# Patient Record
Sex: Female | Born: 1935 | Race: White | Hispanic: No | Smoking: Current every day smoker
Health system: Southern US, Community
[De-identification: ages and names within clinical notes are randomized; demographics above are authoritative.]

## PROBLEM LIST (undated history)

## (undated) DIAGNOSIS — R55 Syncope and collapse: Secondary | ICD-10-CM

## (undated) DIAGNOSIS — M1990 Unspecified osteoarthritis, unspecified site: Secondary | ICD-10-CM

## (undated) DIAGNOSIS — F329 Major depressive disorder, single episode, unspecified: Secondary | ICD-10-CM

## (undated) DIAGNOSIS — R5383 Other fatigue: Secondary | ICD-10-CM

## (undated) DIAGNOSIS — E7800 Pure hypercholesterolemia, unspecified: Secondary | ICD-10-CM

## (undated) DIAGNOSIS — G4730 Sleep apnea, unspecified: Secondary | ICD-10-CM

## (undated) DIAGNOSIS — I4819 Other persistent atrial fibrillation: Secondary | ICD-10-CM

## (undated) DIAGNOSIS — T8859XA Other complications of anesthesia, initial encounter: Secondary | ICD-10-CM

## (undated) DIAGNOSIS — J449 Chronic obstructive pulmonary disease, unspecified: Secondary | ICD-10-CM

## (undated) DIAGNOSIS — I4891 Unspecified atrial fibrillation: Secondary | ICD-10-CM

## (undated) DIAGNOSIS — F0390 Unspecified dementia without behavioral disturbance: Secondary | ICD-10-CM

## (undated) DIAGNOSIS — F32A Depression, unspecified: Secondary | ICD-10-CM

## (undated) DIAGNOSIS — T4145XA Adverse effect of unspecified anesthetic, initial encounter: Secondary | ICD-10-CM

## (undated) HISTORY — PX: BACK SURGERY: SHX140

## (undated) HISTORY — PX: NECK SURGERY: SHX720

## (undated) HISTORY — DX: Major depressive disorder, single episode, unspecified: F32.9

## (undated) HISTORY — DX: Other persistent atrial fibrillation: I48.19

## (undated) HISTORY — DX: Syncope and collapse: R55

## (undated) HISTORY — PX: EYE SURGERY: SHX253

## (undated) HISTORY — DX: Other fatigue: R53.83

## (undated) HISTORY — PX: ABDOMINAL HYSTERECTOMY: SHX81

## (undated) HISTORY — PX: KNEE SURGERY: SHX244

## (undated) HISTORY — DX: Unspecified atrial fibrillation: I48.91

## (undated) HISTORY — DX: Depression, unspecified: F32.A

---

## 1998-05-06 ENCOUNTER — Emergency Department (HOSPITAL_COMMUNITY): Admission: EM | Admit: 1998-05-06 | Discharge: 1998-05-06 | Payer: Self-pay | Admitting: Emergency Medicine

## 1999-01-21 ENCOUNTER — Other Ambulatory Visit: Admission: RE | Admit: 1999-01-21 | Discharge: 1999-01-21 | Payer: Self-pay | Admitting: Podiatry

## 2001-05-09 ENCOUNTER — Other Ambulatory Visit: Admission: RE | Admit: 2001-05-09 | Discharge: 2001-05-09 | Payer: Self-pay | Admitting: Family Medicine

## 2002-10-08 ENCOUNTER — Encounter: Payer: Self-pay | Admitting: Family Medicine

## 2002-10-08 ENCOUNTER — Encounter: Admission: RE | Admit: 2002-10-08 | Discharge: 2002-10-08 | Payer: Self-pay | Admitting: Family Medicine

## 2003-01-03 ENCOUNTER — Inpatient Hospital Stay (HOSPITAL_COMMUNITY): Admission: RE | Admit: 2003-01-03 | Discharge: 2003-01-08 | Payer: Self-pay | Admitting: Neurosurgery

## 2003-01-03 ENCOUNTER — Encounter: Payer: Self-pay | Admitting: Neurosurgery

## 2003-07-29 ENCOUNTER — Ambulatory Visit (HOSPITAL_COMMUNITY): Admission: RE | Admit: 2003-07-29 | Discharge: 2003-07-29 | Payer: Self-pay | Admitting: *Deleted

## 2003-07-29 ENCOUNTER — Encounter (INDEPENDENT_AMBULATORY_CARE_PROVIDER_SITE_OTHER): Payer: Self-pay | Admitting: *Deleted

## 2004-06-24 ENCOUNTER — Encounter (INDEPENDENT_AMBULATORY_CARE_PROVIDER_SITE_OTHER): Payer: Self-pay | Admitting: *Deleted

## 2004-06-24 ENCOUNTER — Ambulatory Visit (HOSPITAL_COMMUNITY): Admission: RE | Admit: 2004-06-24 | Discharge: 2004-06-24 | Payer: Self-pay | Admitting: *Deleted

## 2004-10-08 ENCOUNTER — Encounter: Admission: RE | Admit: 2004-10-08 | Discharge: 2004-10-08 | Payer: Self-pay | Admitting: Orthopedic Surgery

## 2004-12-29 ENCOUNTER — Encounter: Admission: RE | Admit: 2004-12-29 | Discharge: 2004-12-29 | Payer: Self-pay | Admitting: Orthopedic Surgery

## 2005-09-27 ENCOUNTER — Other Ambulatory Visit: Admission: RE | Admit: 2005-09-27 | Discharge: 2005-09-27 | Payer: Self-pay | Admitting: Family Medicine

## 2006-01-19 ENCOUNTER — Ambulatory Visit: Payer: Self-pay | Admitting: Pulmonary Disease

## 2006-02-11 ENCOUNTER — Ambulatory Visit: Payer: Self-pay | Admitting: Pulmonary Disease

## 2006-02-24 ENCOUNTER — Ambulatory Visit: Payer: Self-pay | Admitting: Pulmonary Disease

## 2006-03-01 ENCOUNTER — Ambulatory Visit: Payer: Self-pay | Admitting: Pulmonary Disease

## 2006-03-01 ENCOUNTER — Ambulatory Visit: Admission: RE | Admit: 2006-03-01 | Discharge: 2006-03-01 | Payer: Self-pay | Admitting: Pulmonary Disease

## 2006-03-01 ENCOUNTER — Encounter (INDEPENDENT_AMBULATORY_CARE_PROVIDER_SITE_OTHER): Payer: Self-pay | Admitting: *Deleted

## 2006-03-23 ENCOUNTER — Ambulatory Visit: Payer: Self-pay | Admitting: Pulmonary Disease

## 2006-04-25 ENCOUNTER — Ambulatory Visit (HOSPITAL_BASED_OUTPATIENT_CLINIC_OR_DEPARTMENT_OTHER): Admission: RE | Admit: 2006-04-25 | Discharge: 2006-04-25 | Payer: Self-pay | Admitting: Pulmonary Disease

## 2006-04-25 ENCOUNTER — Ambulatory Visit: Payer: Self-pay | Admitting: Pulmonary Disease

## 2006-05-17 ENCOUNTER — Ambulatory Visit: Payer: Self-pay | Admitting: Cardiovascular Disease

## 2006-05-27 ENCOUNTER — Ambulatory Visit: Payer: Self-pay | Admitting: Pulmonary Disease

## 2007-01-12 ENCOUNTER — Ambulatory Visit: Payer: Self-pay | Admitting: Pulmonary Disease

## 2007-02-13 ENCOUNTER — Ambulatory Visit: Payer: Self-pay | Admitting: Pulmonary Disease

## 2007-02-13 ENCOUNTER — Ambulatory Visit (HOSPITAL_BASED_OUTPATIENT_CLINIC_OR_DEPARTMENT_OTHER): Admission: RE | Admit: 2007-02-13 | Discharge: 2007-02-13 | Payer: Self-pay | Admitting: Pulmonary Disease

## 2007-03-24 ENCOUNTER — Ambulatory Visit: Payer: Self-pay | Admitting: Pulmonary Disease

## 2007-09-06 ENCOUNTER — Ambulatory Visit: Payer: Self-pay | Admitting: Pulmonary Disease

## 2007-09-06 DIAGNOSIS — F17200 Nicotine dependence, unspecified, uncomplicated: Secondary | ICD-10-CM

## 2007-09-06 DIAGNOSIS — J449 Chronic obstructive pulmonary disease, unspecified: Secondary | ICD-10-CM

## 2007-09-06 DIAGNOSIS — G4733 Obstructive sleep apnea (adult) (pediatric): Secondary | ICD-10-CM

## 2007-11-03 ENCOUNTER — Telehealth (INDEPENDENT_AMBULATORY_CARE_PROVIDER_SITE_OTHER): Payer: Self-pay | Admitting: *Deleted

## 2007-11-20 ENCOUNTER — Encounter: Payer: Self-pay | Admitting: Pulmonary Disease

## 2009-04-30 ENCOUNTER — Encounter: Admission: RE | Admit: 2009-04-30 | Discharge: 2009-06-10 | Payer: Self-pay | Admitting: Family Medicine

## 2009-06-10 ENCOUNTER — Inpatient Hospital Stay (HOSPITAL_COMMUNITY): Admission: RE | Admit: 2009-06-10 | Discharge: 2009-06-11 | Payer: Self-pay | Admitting: Neurosurgery

## 2010-01-19 ENCOUNTER — Encounter: Admission: RE | Admit: 2010-01-19 | Discharge: 2010-01-19 | Payer: Self-pay | Admitting: Orthopedic Surgery

## 2010-07-29 ENCOUNTER — Encounter: Payer: Self-pay | Admitting: Pulmonary Disease

## 2010-09-16 ENCOUNTER — Encounter: Payer: Self-pay | Admitting: Pulmonary Disease

## 2010-10-06 NOTE — Letter (Signed)
Summary: Denied CMN/[facility]  Denied CMN/[facility]   Imported By: Lester Ellinwood 08/04/2010 12:52:01  _____________________________________________________________________  External Attachment:    Type:   Image     Comment:   External Document

## 2010-10-08 NOTE — Letter (Signed)
Summary: CMN for DME Denied / [facility]  CMN for DME Denied / [facility]   Imported By: Lennie Odor 09/22/2010 09:55:08  _____________________________________________________________________  External Attachment:    Type:   Image     Comment:   External Document

## 2010-12-10 LAB — CBC
HCT: 38.9 % (ref 36.0–46.0)
Hemoglobin: 13.3 g/dL (ref 12.0–15.0)
MCHC: 34.2 g/dL (ref 30.0–36.0)
MCV: 92.7 fL (ref 78.0–100.0)
RBC: 4.2 MIL/uL (ref 3.87–5.11)
RDW: 12.9 % (ref 11.5–15.5)
WBC: 11.2 10*3/uL — ABNORMAL HIGH (ref 4.0–10.5)

## 2010-12-10 LAB — BASIC METABOLIC PANEL
BUN: 13 mg/dL (ref 6–23)
Calcium: 9.3 mg/dL (ref 8.4–10.5)
Chloride: 104 mEq/L (ref 96–112)
Creatinine, Ser: 0.64 mg/dL (ref 0.4–1.2)
GFR calc non Af Amer: >60 mL/min (ref >60)
Glucose, Bld: 71 mg/dL (ref 70–99)
Sodium: 139 mEq/L (ref 135–145)

## 2011-01-19 NOTE — Assessment & Plan Note (Signed)
[facility]                             PULMONARY OFFICE NOTE   AMESHA, Linda Morse                  MRN:          [id number]  DATE:01/12/2007                            DOB:          07/04/1934    I saw Linda Morse today in follow-up for her COPD with continued  tobacco abuse and obstructive sleep apnea.   With regards to her obstructive sleep apnea, she tried positional  therapy but due to symptoms of arthritis, she has had difficulty with  this.  She still complains of sleep disruption with excessive daytime  sleepiness.   With regards to her COPD, she seems to be doing reasonably well on her  inhaler regimen of Spiriva one puff daily.  She says she still continues  to smoke.  She had tried Chantix previously but was not able to tolerate  this.   PHYSICAL EXAMINATION:  VITAL SIGNS:  She is 122 pounds, temperature 98,  blood pressure 110/60, heart rate 66, oxygen saturation 94% on room air.  HEENT:  There was no sinus tenderness, no oral lesion.  CHEST:  No wheezing.  CARDIOVASCULAR:  S1 and S2.  ABDOMEN:  Thin, soft and nontender.  EXTREMITIES:  No edema.   IMPRESSION:  1. Chronic obstructive pulmonary disease.  I would continue her on      Spiriva one puff daily.  2. Tobacco abuse.  I will start her on Wellbutrin 150 mg daily. For      three days and then increase it to 150 mg b.i.d. She denies any      history of seizure disorder.  I have also instructed her that she      will need to quit smoking approximately one week after starting the      Wellbutrin.  3. Obstructive sleep apnea.  She has not had a good response to      positional therapy.  I had reviewed other options for treatment of      her sleep apnea including CPAP therapy, oral appliance and surgical      intervention.  She would prefer to try CPAP therapy.  I will      therefore refer her back to the sleep lab for CPAP titration study.   I will follow up with her in  approximately six weeks.     Coralyn Helling, MD  Electronically Signed    VS/MedQ  DD: 01/12/2007  DT: 01/12/2007  Job #: [id number]   cc:   Chales Salmon. Abigail Miyamoto, M.D.

## 2011-01-19 NOTE — Procedures (Signed)
Linda Morse, Linda Morse           ACCOUNT NO.:  [id number]   MEDICAL RECORD NO.:  [id number]          PATIENT TYPE:  OUT   LOCATION:  SLEEP CENTER                 FACILITY:  [facility]   PHYSICIAN:  Coralyn Helling, MD        DATE OF BIRTH:  May 06, 1934   DATE OF STUDY:  02/13/2007                            NOCTURNAL POLYSOMNOGRAM   REFERRING PHYSICIAN:  Coralyn Helling, MD   INDICATIONS FOR PROCEDURE:  This is an individual who had undergone an  overnight polysomnogram on April 25, 2006 and was found not have an  apnea/hypopnea index of 15.2 with an oxygen saturation of 80%. She was  initially tried on positional therapy but did not have a good response  to this. She is referred to the sleep lab for a CPAP titration study.   RESULTS:  Epworth score 7.   MEDICATIONS:  None.   SLEEP ARCHITECTURE:  Total recording time was 383 minutes. Total sleep  time was 257 minutes. Sleep efficiency was 67%, which is reduced. Sleep  latency was 47 minutes, which is prolonged. REM latency was 42.5  minutes, which is reduced. The patient observed in all stages of sleep.  The patient slept in both the supine and non-supine position.   RESPIRATORY DATA:  The average respiratory rate was 16. The patient was  initially titrated from a CPAP pressure setting of 5 to 8 cm of water.  She was then switched to Bi-PAP at 8/4. When she was initially tried on  CPAP therapy, she did have improvement in her respiratory pattern,  however, she was noted to have frequent PCV's, which completely resolved  after being switched to BiPAP therapy. At a BiPAP pressure setting of  8/4, her apnea/hypopnea index was 2.6. At this pressure setting, she was  observed in both REM sleep and supine sleep and snoring was eliminated.   OXYGEN DATA:  The baseline oxygenation was 98%. The oxygen saturation  nadir was 90%. At a BiPAP pressure setting of 8/4, the mean oxygenation  during non-REM sleep was 95%. The mean oxygenation during REM  sleep was  95%. The minimal oxygenation during non-REM sleep was 93% and the  minimal oxygenation during REM sleep was 92%.   CARDIAC DATA:  The rhythm strip showed normal sinus rhythm with sinus  bradycardia and frequent PVC's, which resolved after she was started on  Bi-PAP.   MOVEMENT/PARASOMNIA:  The periodic limb movement index was 2.6. The  patient had 1 bathroom trip. No other abnormal behaviors were noted.   IMPRESSION:  This was a CPAP titration study. The patient was titrated  to a BiPAP pressure study of 8/4 with a reduction in her apnea/hypopnea  index of 2.6. At this pressure setting she was observed in both REM  sleep and supine sleep and snoring was eliminated. Of note is that at  lower pressure settings, while on CPAP, she had persistent PVC's, which  appeared to have resolved after she was switched to BiPAP therapy.  Therefore, I have recommended that she be initiated on BiPAP at 8/4.      Coralyn Helling, MD  Diplomat, American Board of Sleep Medicine  Electronically Signed  VS/MEDQ  D:  02/18/2007 11:49:14  T:  02/18/2007 22:15:56  Job:  [id number]

## 2011-01-19 NOTE — Assessment & Plan Note (Signed)
[facility]                             PULMONARY OFFICE NOTE   LARYA, CHARPENTIER                  MRN:          [id number]  DATE:03/24/2007                            DOB:          1933/09/13    I saw Ms. Blackburn in follow up today for her chronic obstructive  pulmonary disease, tobacco abuse, and obstructive sleep apnea. Since her  last visit, she has undergone a BiPAP titration study and she has been  titrated to a BiPAP pressure setting of 8/4 with a reduction in her  apnea hypopnea to 2.6. Since that time she has been started on BiPAP  with a full face mask. She says that she is sleeping much better and  feels like she has more energy during the day. Her main difficulty is  that she has soreness over the bridge of her nose. She has not tried any  other mask types. She has a heated humidifier, but was not aware that  she could adjust the temperature on this. She does complain of having  dryness in her mouth. With regards to her chronic obstructive pulmonary  disease, she says that her symptoms are reasonably stable on her inhaler  regimen of Spiriva. With regards to her tobacco abuse, she continues to  smoke intermittently.   CURRENT MEDICATIONS:  1. Miacalcin once daily.  2. Premarin daily.  3. Zetia 10 mg daily.  4. Lipitor 40 mg daily.  5. Spiriva 1 puff daily.  6. Vitamin E daily.  7. Aspirin 81 mg daily.   PHYSICAL EXAMINATION:  VITAL SIGNS:  Temperature 98.1, weight 120, blood  pressure 118/60, heart rate 66, oxygen saturation 94% on room air.  HEENT:  No sinus tenderness, no nasal discharge, no oral lesions. She  has mild erythema on the bridge of her nose. There is no  lymphadenopathy.  HEART:  S1, S2.  CHEST:  Prolonged expiratory phase, but no wheezing or rales.  ABDOMEN:  Soft and nontender.  EXTREMITIES:  No edema.   IMPRESSION:  1. Chronic obstructive pulmonary disease. She is to continue on her      use of  Spiriva.  2. Tobacco abuse. I have, again, emphasized to her the importance of      smoking cessation.  3. Obstructive sleep apnea. She is doing fairly well on her current      settings of BiPAP at 8/4. I      have discussed with her face techniques to improve the fit of her      mask. I will arrange for her to be fitted with a nasal mask with a      chin strap to see if this will improve her tolerance.   I plan on following up with her in approximately 4-6 months.     Coralyn Helling, MD  Electronically Signed    VS/MedQ  DD: 03/24/2007  DT: 03/25/2007  Job #: [id number]

## 2011-01-22 NOTE — Discharge Summary (Signed)
NAME:  Linda Morse, Linda Morse                     ACCOUNT NO.:  [id number]   MEDICAL RECORD NO.:  [id number]                   PATIENT TYPE:  INP   LOCATION:  3002                                 FACILITY:  [facility]   PHYSICIAN:  Hewitt Shorts, M.D.            DATE OF BIRTH:  12-Sep-1933   DATE OF ADMISSION:  01/03/2003  DATE OF DISCHARGE:  01/08/2003                                 DISCHARGE SUMMARY   HISTORY OF PRESENT ILLNESS:  Admission history and physical examination  shows patient is a 77 year old woman who presented with right lumbar  radiculopathy.  It had worsened over the past couple of years with low back  pain radiating into the right buttock, posterior thigh and calf.  The  patient had been evaluated with MRI and x-rays.  There were degenerative  dynamic spondylolisthesis of L4-L5 secondary to facet arthropathy that was  moderately advanced.  There was multifactorial lumbar stenosis L4-L5 with a  central to right L4-L5 lumbar disc versus a small left L4-L5 synovial cyst.  The patient was admitted for decompression and arthrodesis.  Her general  examination was unremarkable. Her neurological examination showed 5/5  strength.   HOSPITAL COURSE:  The patient was admitted and underwent a L4-L5 posterior  lumbar interbody fusion with Globus PEEK cages with VTOS and bone marrow  aspirate and bilateral L4-L5 posterior lateral arthrodesis with 90D  posterior instrumentation and VTOS with bone marrow aspirate with  microdissection.  Following surgery the patient did well. She initially had  some mild fever.  She had difficulty with tablets. We ordered Tylox capsules  but they apparently are not available within the hospital and therefore she  was placed on an Oxycodone elixir and has done well with that.  Her  mobilization was slow and therefore physical therapy and occupational  therapy were consulted and the patient has made gradual progress in  increasing her mobility and  activity.  Her wound has healed well.  She is  afebrile.  She did have some evidence of mild dysuria but a urinalysis was  negative and a urine culture is pending at this time.   Social work was consulted to make arrangements for home health physical  therapy, occupational therapy and home health aide.  The patient has  explained that she has the necessary durable medical equipment at home.   DISPOSITION:  She is being discharged to home with instructions regarding  wound care and activity.  She is to return to my office in three weeks for  follow up and is to have AP and lateral lumbar spine x-rays done at that  time.  A discharge prescription was given for Tylox one capsule p.o. q.i.d.  PRN pain #40 capsules prescribed with no refills.  She is also advised to  use Aleve two tablets b.i.d. and for constipation she is advised to use  Dulcolax tablets, Milk of Magnesia or Fleet's enema.   DISCHARGE DIAGNOSES:  1. Lumbar disc herniation.  2. Degenerative dynamic lumbar spondylolisthesis.  3. Lumbar stenosis.  4. Lumbar radiculopathy.                                               Hewitt Shorts, M.D.    RWN/MEDQ  D:  01/08/2003  T:  01/08/2003  Job:  [id number]

## 2011-01-22 NOTE — Procedures (Signed)
NAME:  Linda Morse, Linda Morse           ACCOUNT NO.:  [id number]   MEDICAL RECORD NO.:  [id number]          PATIENT TYPE:  OUT   LOCATION:  SLEEP CENTER                 FACILITY:  [facility]   PHYSICIAN:  Coralyn Helling, MD        DATE OF BIRTH:  02/19/1934   DATE OF STUDY:  04/25/2006                              NOCTURNAL POLYSOMNOGRAM   REFERRING PHYSICIAN:  Coralyn Helling MD   INDICATIONS FOR THIS STUDY:  This is an individual who has symptoms of  daytime fatigue and snoring as well as hypertension; and she is referred to  the sleep lab for evaluation of obstructive sleep apnea.  Her medications  are Lipitor, Zetia, Premarin, Detrol LA and Spiriva.   EPWORTH SCORE:  6   SLEEP ARCHITECTURE:  Total recording time was 419.5 minutes, total sleep  time was 291.5 minutes.  Sleep efficiency was 69% which is reduced; of note  is that the patient spent a prolonged period of time initially reading a  book before going to sleep.  She was observed in all stages of sleep,  although there was a relative reduction in percentage of REM sleep to 13% of  this study.  Sleep latency was 70.5 minutes which was prolonged.  REM  latency was 63 minutes after sleep onset which is slightly reduced.  The  patient was observed in both the supine and nonsupine position.   RESPIRATORY DATA:  The average respiratory rate was 18.  The apnea-hypopnea  index was 15.2.  the events were exclusively obstructive in nature.  Moderate snoring was noted by the technician.  Of note, is that the patient  had a significant supine effect with the supine apnea-hypopnea index of 27.4  versus a nonsupine apnea-hypopnea index of 3.3.   OXYGEN DATA:  The baseline oxygenation was 94%.  The oxygen saturation nadir  was 88%.  The patient spent total a total of 411.4 minutes of test time with  an oxygen saturation between 91 and 100% and 1.9 minutes with an oxygen  saturation between 81 and 90%.   CARDIAC DATA:  The average heart rate was 60.   EKG showed normal sinus  rhythm with sinus bradycardia and occasional PACs.   MOVEMENT/PARASOMNIA:  Periodic limb movement index was 11.9   IMPRESSION/RECOMMENDATION:  This study shows evidence for a moderate  obstructive sleep apnea as demonstrated by an apnea-hypopnea index of 15.2  and an oxygen saturation nadir of 88%.  Of note is that she had a  significant positional component to her sleep apnea.  At this time, the  recommendation would be to have her try  positional therapy.  However, if she is not able to tolerate this and she is  still symptomatic, then further consideration should be given to either CPAP  therapy, oral appliance, or surgical interventions.      Coralyn Helling, MD  Diplomat, American Board of Sleep Medicine  Electronically Signed     VS/MEDQ  D:  05/03/2006 15:27:47  T:  05/04/2006 11:45:03  Job:  [id number]

## 2011-01-22 NOTE — H&P (Signed)
NAME:  Linda Morse, Linda Morse                     ACCOUNT NO.:  [id number]   MEDICAL RECORD NO.:  [id number]                   PATIENT TYPE:  INP   LOCATION:  3002                                 FACILITY:  [facility]   PHYSICIAN:  Hewitt Shorts, M.D.            DATE OF BIRTH:  [birth year]   DATE OF ADMISSION:  01/03/2003  DATE OF DISCHARGE:                                HISTORY & PHYSICAL   HISTORY OF PRESENT ILLNESS:  The patient is a 77 year old woman who was  evaluated for right lumbar radiculopathy. She was having some difficulty  with low back pain for two or more years. She has had a variable amount of  right lumbar radicular pain since it has worsened last fall. She describes  the pain as in her low back, extending down into the right buttock,  posterior thigh, and calf. She does not describe any numbness, paresthesia,  or weakness. She has been using ibuprofen which does ease her discomfort;  however, she still finds that standing for 10 minutes or more will cause  pain running down into the right lower extremity.   PAST MEDICAL HISTORY:  1. Hypercholesterolemia.  2. Osteoporosis.   She does not describe any history of hypertension, myocardial infarction,  cancer, stroke, diabetes, peptic ulcer disease, or lung disease.   ALLERGIES:  She does not describe any allergies to medications.   MEDICATIONS:  1. Premarin 0.9 mg q.d.  2. Miacalcin q.d.  3. She has been on Lipitor but has not been taking it for the past week.   PAST SURGICAL HISTORY:  1. Hysterectomy in 1990.  2. Foot surgery in 2000.   FAMILY HISTORY:  Her parents have passed on.   SOCIAL HISTORY:  The patient is a widow. She is retired from the [employer]. She  smokes a pack a day. She has smoked for 44 years. She does not drink  alcoholic beverages, and she denies history of substance abuse.   REVIEW OF SYSTEMS:  Notable as described in the history of present illness  and past medical history but otherwise  unremarkable.   PHYSICAL EXAMINATION:  GENERAL:  The patient is a well-developed, well-  nourished, white female in no acute distress. Her height is 5 foot, weight  120 pounds. She is afebrile. Temperature is 97.6, pulse 71, blood pressure  148/78, respiratory rate 16.  LUNGS:  Clear to auscultation. She has symmetric respiratory excursion.  HEART:  Regular rate and rhythm. Normal S1 and S2. There is no murmur.  ABDOMEN:  Soft, nondistended, bowel sounds are present.  EXTREMITIES:  She has no clubbing, cyanosis, or edema.  MUSCULOSKELETAL:  Shows no tenderness to palpation of her lumbar spine  processes or parallel musculature. She is able to flex to 90 degrees, able  to extend well. Straight leg raise is negative bilaterally.  NEUROLOGICAL:  Shows 5/5 strength in the dorsiflexor, plantar flexor, flexor  hallucis longus bilaterally. Sensation is intact to  pinprick in the distal  lower extremities. Reflexes are 2 to 3 in the quadriceps, 1 to 2 in the  gastrocnemius, symmetrical bilaterally. Toes are downgoing bilaterally. She  has a normal gait and stance.   DIAGNOSTIC STUDIES:  MRI of the lumbar spine shows degenerative dynamic  spondylolisthesis, L4 and 5, secondary to facet arthropathy. There is  multifactorial lumbar stenosis at L4-5 with a central to right L4-5 disk  herniation, a small left L4-5 synovial cyst.   IMPRESSION:  The patient with low back and right lumbar radicular pain  secondary to a central to right L4-5 disk herniation superimposed upon  degenerative dynamic spondylolisthesis, lumbar stenosis, lumbar spondylosis,  and degenerative disk disease.   PLAN:  The patient will be admitted for a L4 Gill procedure, L4-5  microdiskectomy, and posterior lumbar interbody fusion with posterior  arthrodesis with posterior instrumentation and bone graft. I discussed the  nature of the procedure at length with the patient using a model in my  office along with her x-rays and  MRI. I discussed the nature of surgery,  recuperation for the patient postoperatively, and need for postoperative  mobilization in a lumbar brace. We also discussed surgical risks of  infection, bleeding, possible need for transfusion, the risk of never  reducing the pain, ________ possible need for further surgery, anesthetic  risks for myocardial infarction, stroke, pneumonia, and death. We also  discussed failure of the arthrodesis in light of both her smoking history  and her history of osteoporosis. We also explained that we would use a Cell  saver during her surgery. Understanding all this, she does wish to proceed  with surgery and is admitted for such.                                               Hewitt Shorts, M.D.    RWN/MEDQ  D:  01/04/2003  T:  01/04/2003  Job:  [id number]

## 2011-01-22 NOTE — Op Note (Signed)
Linda Morse, Linda Morse           ACCOUNT NO.:  [id number]   MEDICAL RECORD NO.:  [id number]          PATIENT TYPE:  AMB   LOCATION:  CARD                         FACILITY:  [facility]   PHYSICIAN:  Coralyn Helling, M.D.      DATE OF BIRTH:  05-25-1934   DATE OF PROCEDURE:  03/01/2006  DATE OF DISCHARGE:                                 OPERATIVE REPORT   PREOPERATIVE DIAGNOSIS:  Rule out sarcoidosis.   POSTOPERATIVE DIAGNOSIS:  Rule out sarcoidosis.   PROCEDURE:  Bronchoscopy with bronchioalveolar lavage and transbronchial  biopsy.   [name] is a 77 year old female who is referred for evaluation of  dyspnea.  She is noted to have multinodular lesions on CT scan of the chest  from both January and April, 2007.  She had pulmonary function tests which  showed mild air flow obstruction and air trapping.  There was a concern that  this may represent pulmonary sarcoidosis.  The procedure was explained to  the patient in detail.  Risks involved were explained, including possible  risk for bleeding, infection, pneumothorax, and nondiagnosis.  The patient  signed the consent form.  She received lidocaine nebulizer for topical  anesthesia of her nares and posterior pharynx.  She was given a total of 5  mg of Versed and 100 mcg of fentanyl IV for sedation.  She had narrow nasal  passages.  As a result, the bronchoscope was entered orally.  The vocal  cords were visualized and appeared normal.  Lidocaine 2% was instilled as  needed for topical anesthesia.  The bronchoscope was then entered into the  trachea.  The carina was visualized and appeared normal.  There was no  obvious endobronchial lesions.  The left main bronchus was then entered.  The left upper lingular and lower lobe orifices were all visualized and  appeared normal.  The mucosa appeared normal, and there were no  endobronchial lesions.  The bronchoscope was then entered into the right  main bronchus.  The right upper, middle,  and lower lobe orifices were all  visualized.  There were no obvious endobronchial lesions.  The mucosa  appeared normal.  Then 60 cc of saline was instilled to the right upper lobe  for BAL with approximately 15 ml of fluid returned, which was clear and  foamy.  This was saved, and then the bronchoscope was entered into the right  lower lobe.  Again, 60 cc of saline was instilled for BAL.  There was,  again, approximately 15 cc of fluid returned.  This was clear and foamy.  Then using fluoroscopic guidance, transbronchial biopsies were obtained from  the right lower lobe and then the right upper lobe.  There was a minimal  amount of bleeding, but on further inspection and after instillation of  saline, the bleeding resolved spontaneously.  Of note was that the patient  had a temporary drop in her oxygen saturation, which improved immediately  with jaw thrust maneuver.  Again, there was a clinical suspicion beforehand  for the possibility of obstructive sleep apnea.  Otherwise, the patient  tolerated the procedure well.  There was  no other immediate complication.  She is scheduled to have a post procedure chest x-ray and the samples will  be sent for cytology, microbiology, and surgical pathology.  I then will  call her with the results as soon as they are available.      Coralyn Helling, M.D.  Electronically Signed     VS/MEDQ  D:  03/01/2006  T:  03/01/2006  Job:  [id number]

## 2011-01-22 NOTE — Op Note (Signed)
NAME:  Linda Morse, Linda Morse                     ACCOUNT NO.:  [id number]   MEDICAL RECORD NO.:  [id number]                   PATIENT TYPE:  INP   LOCATION:  2899                                 FACILITY:  [facility]   PHYSICIAN:  Hewitt Shorts, M.D.            DATE OF BIRTH:  04-14-1934   DATE OF PROCEDURE:  01/03/2003  DATE OF DISCHARGE:                                 OPERATIVE REPORT   PREOPERATIVE DIAGNOSIS:  Lumbar stenosis, dynamic degenerative  spondylolisthesis, lumbar disk herniation, lumbar radiculopathy, lumbar  spondylosis and lumbar degenerative disk disease.   POSTOPERATIVE DIAGNOSIS:  Lumbar stenosis, dynamic degenerative  spondylolisthesis, lumbar disk herniation, lumbar radiculopathy, lumbar  spondylosis and lumbar degenerative disk disease.   OPERATION PERFORMED:  L4 Gill procedure.  Bilateral L4-5 posterior lumbar  interbody fusion with Globus PEEK cages with Vitoss and bone marrow aspirate  and bilateral L4-5 posterolateral arthrodesis with 90D posterior  instrumentation and Vitoss with bone marrow aspirate.   SURGEON:  Hewitt Shorts, M.D.   ASSISTANT:  Stefani Dama, M.D.   ANESTHESIA:  General endotracheal.   INDICATIONS FOR PROCEDURE:  The patient is a  77 year old woman who  presented with lumbar radiculopathy and was found to have a ruptured disk  superimposed upon spinal stenosis contributed to by dynamic degenerative  spondylolisthesis with underlying spondylosis and degenerative disk disease.  Decision was made to proceed with decompression and arthrodesis.   DESCRIPTION OF PROCEDURE:  The patient was brought to the operating room and  placed under general endotracheal anesthesia. The patient was turned to  prone position and lumbar region was prepped with Betadine soap and solution  and draped in sterile fashion. The midline was infiltrated with local  anesthetic with epinephrine.  An x-ray was taken, the L4-5 level identified  and then  midline incision made over that level and carried down to the  subcutaneous tissue with bipolar cautery and electrocautery used to maintain  hemostasis.  Dissection was carried down the lumbar fascia which was incised  bilaterally and the paraspinal muscles dissected from the spinous processes  and lamina in subperiosteal fashion.  The L4-5 interlaminar space was  identified and x-ray was taken to confirm localization and then dissection  was carried out laterally over the facet joints to expose the L4 and L5  transverse process bilaterally.  These were packed off and then we proceeded  with L4 Gill procedure, performing a laminectomy and facetectomy so as to  decompress the stenosis and foramina.  We identified the thecal sac and L4  and L5 nerve roots bilaterally.  There is a moderate amount of scarring  between the ligamentum flavum and the dura and two small dural rents  occurred on the right side.  These were identified and repaired with 6-0  Prolene suture with good watertight closure.  Immediately prior to  completing and closing this procedure, we did inject Tisseal over these  repairs.  We explored the  epidural space.  There was a large broad based  spondylitic disk herniation at L4-5 with a central disk herniation fragment.  This fragment was removed and then we proceeded with a thorough diskectomy  bilaterally using a variety of curets and pituitary rongeurs, we removed  posterior osteophytic overgrowth bilaterally and the disk space was cleared  of disk material.  We were thereby able to decompress the thecal sac and  nerve roots bilaterally.  We then proceeded with placement of pedicle screws  bilaterally at L4 and L5 using C-arm fluoroscopy for guidance.  The  transverse processes were identified.  The pedicles were identified within  the lateral aspect of the canal and we identified pedicle entry sites  bilaterally at each level.  Again, using the C-arm fluoroscopy, the  pedicles  were probed.  We examined them with a ball probe.  No cut outs were noted.  We then tapped the pedicles with a 5.25 tap with C-arm fluoroscopy.  Again  the pedicles were examined with a ball probe.  No cut outs were noted.  We  did aspirate bone marrow aspirate approximately a total of 25mL which was  injected over a 10mL Vitoss sponge as well as 15mL of loose Vitoss.  We  placed 6.75 x 40 mm polyaxial head screws at L4 and 6.75 x 35 mm fixed head  screws at L5.  An AP C-arm image was obtained which showed good trapezoid  trajectory for the screws.  We then examined the disk space and prepared it  for the interbody arthrodesis.  Using sizers we determined that we would use  an 11 mm narrow Globus PEEK cage.  The cages were packed with Vitoss soaked  in bone marrow aspirate and the cages were placed bilaterally.  We then  packed loose Vitoss soaked in bone marrow aspirate between the cages in the  intervertebral disk space. This was tamped down and densely packed; however,  care was taken to ensure that none of the graft was against any of the  neural elements.  We then packed then Vitoss sponge that was cut in half in  the lateral gutter.  We decorticated the transverse process of L4 and L5  bilaterally and the Vitoss sponge was packed over these  and to the  intertransverse space.  We then selected 40 mm rods that were positioned in  the screw heads and locking caps were placed and fully tightened at all four  screws.  The wound was irrigated throughout the procedure extensively with  bacitracin solution.  The wound was checked for hemostasis and once this was  established and confirmed, we proceeded with closure.  The deep fascia  closed with interrupted undyed 1 Vicryl suture, the subcutaneous and  subcuticular layer closed with interrupted inverted 2-0 undyed Vicryl  sutures and the skin edges approximated with Dermabond.  The patient tolerated the procedure well.  The  estimated blood loss was .  The  patient was given back of Cell Saver blood.  Sponge, needle and  instrument counts were correct.  Following surgery, the patient was turned  back to supine position, reversed from anesthetic, extubated, transferred to  recovery room for further care where she was noted to be following commands  and moving all four extremities.  Hewitt Shorts, M.D.    RWN/MEDQ  D:  01/03/2003  T:  01/03/2003  Job:  [id number]

## 2011-01-22 NOTE — Assessment & Plan Note (Signed)
[facility]                               PULMONARY OFFICE NOTE   Linda Morse, Linda Morse                  MRN:          [id number]  DATE:03/23/2006                            DOB:          1934-07-20    I saw Linda Morse today in followup for her COPD, possible sleep apnea  and abnormal CT.  In summary again, she has undergone a bronchoscopy on June  26 and the results of this were essentially negative.  She says that she  initially had symptomatic benefit from the use of Chantix with regard to  smoking cessation although there was one day in which she took the medicine  and then subsequently became nauseous and had stopped taking the medicine  after that.  Although she is not sure if the nausea was in fact related to  the use of Chantix, she is currently smoking a half pack of cigarettes per  day.  She still complains of symptoms of dyspnea on exertion although she  says the use of Spiriva helps.  She is having similar problems that she had  before with regard to her possible sleep disorder.  She has not had any  significant change in her medications  since her last visit.   PHYSICAL EXAMINATION:  VITAL SIGNS: She is 124 pounds, temperature is 98.4,  blood pressure is 110/78, heart rate is 89, oxygen saturation 94% on room  air.  HEENT:  Pupils are reactive, extraocular movements intact.  There is no  sinus tenderness, no nasal discharge. She has a __________ for airway, low-  lying soft palate with scalloped borders of her tongue. She had no  lymphadenopathy, no thyromegaly.  HEART:  S1 and S2, regular rhythm.  CHEST: Decreased breath sounds bilaterally but no wheezing.  ABDOMEN:  Soft and positive bowel sounds.  EXTREMITIES:  No clubbing, cyanosis or edema.   IMPRESSION:  1.  Chronic obstructive pulmonary disease with continued tobacco abuse. I      will re-initiate her on Chantix.  I will continue her on Spiriva and      start her on  Advair 250/50 one puff b.i.d.  2.  Possible obstructive sleep apnea.  I will make arrangements for her to      undergo an overnight polysomnogram to further evaluate this.  3.  Abnormal CT.  Again, she has had normal results from her bronchoscopy.      I would plan on following up a CT of the chest with contrast in      approximately two months to determine if there have been any changes in      the appearance of her CT and then I would plan on following up with her      after she has had her CT but I would call her before that with the      results of her overnight polysomnogram once the results become      available.  Coralyn Helling, MD   VS/MedQ  DD:  03/23/2006  DT:  03/23/2006  Job #:  [id number]

## 2011-01-22 NOTE — Assessment & Plan Note (Signed)
Linda Morse                               PULMONARY OFFICE NOTE   Linda Morse, Linda Morse                  MRN:          [id number]  DATE:05/27/2006                            DOB:          December 31, 1933    I saw Linda Morse today in followup for her COPD, hypersomnia, and  abnormal CT scan of the chest.  With regards to her abnormalities seen on  previous CT, she had undergone a repeat CT scan of the chest on May 17, 2006, and this showed improvement in the nodular appearance compared to  her previous CTs from January and April 2007.  There was no significant  adenopathy either.  With regards to her possible obstructive sleep apnea,  she had undergone an overnight polysomnogram April 25, 2006, and this  showed moderate obstructive sleep apnea with an apnea/hypopnea index of  15.2, oxygen saturation nadir of 88%.  Of note is that she had a significant  positional effect with a supine apnea/hypopnea index of 27.4 versus a non-  supine apnea/hypopnea index of 3.3.   With regards to her COPD, she says that using Spiriva seems to help, but she  did not notice much of a difference from using the Advair.  She says she is  still smoking about 3-4 cigarettes per day but is actively working on trying  to quit.   PHYSICAL EXAMINATION:  VITAL SIGNS:  She is 124 pounds.  Temperature 97.7,  blood pressure 124/80, heart rate 66, oxygen saturation 97% on room air.  HEENT:  No sinus tenderness, no oral lesions.  NECK:  No lymphadenopathy.  HEART: S1, S2.  CHEST:  No wheezing or rales.  ABDOMEN: Soft, nontender.  EXTREMITIES:  No edema.   IMPRESSION:  1. Chronic obstructive pulmonary disease with continued tobacco abuse.  I      will continue her on Spiriva.  Since she did not notice much of a      benefit from the Advair, I will discontinue this and monitor her      symptom response.  I have again strongly encouraged her to continue her  smoking cessation program.  2. Mild to moderate obstructive sleep apnea.  Again, she had a significant      positional component to this.  At this time, I have instructed her on      positional therapy for her sleep apnea.  I have advised her to monitor      her sleep pattern as well as her feelings of tiredness during the day.      If these become progressively worse, that she may warrant further      intervention for her sleep apnea.  3. Abnormal CT scan of the chest.  Again, most recent chest CT shows      significant improvement compared to CT from January and April 2007.  I      do not feel she will need to have any further imaging studies for this      at the present time as these most likely represent benign inflammatory      processes.  Therefore, unless she is having worsening of her symptom      status, clinical monitoring would be appropriate for this.   I have made arrangements for her to have followup with me in approximately 4  months.                                   Linda Helling, MD   VS/MedQ  DD:  05/27/2006  DT:  05/30/2006  Job #:  [id number]   cc:   Chales Salmon. Abigail Miyamoto, M.D.

## 2011-05-25 ENCOUNTER — Inpatient Hospital Stay (HOSPITAL_COMMUNITY)
Admission: EM | Admit: 2011-05-25 | Discharge: 2011-05-27 | DRG: 563 | Disposition: A | Discharge status: Home Health | Payer: Medicare Other | Attending: Internal Medicine | Admitting: Internal Medicine

## 2011-05-25 ENCOUNTER — Emergency Department (HOSPITAL_COMMUNITY): Payer: Medicare Other

## 2011-05-25 DIAGNOSIS — J449 Chronic obstructive pulmonary disease, unspecified: Secondary | ICD-10-CM | POA: Diagnosis present

## 2011-05-25 DIAGNOSIS — D72829 Elevated white blood cell count, unspecified: Secondary | ICD-10-CM | POA: Diagnosis present

## 2011-05-25 DIAGNOSIS — F17200 Nicotine dependence, unspecified, uncomplicated: Secondary | ICD-10-CM | POA: Diagnosis present

## 2011-05-25 DIAGNOSIS — W1809XA Striking against other object with subsequent fall, initial encounter: Secondary | ICD-10-CM | POA: Diagnosis present

## 2011-05-25 DIAGNOSIS — Y92009 Unspecified place in unspecified non-institutional (private) residence as the place of occurrence of the external cause: Secondary | ICD-10-CM

## 2011-05-25 DIAGNOSIS — S82009A Unspecified fracture of unspecified patella, initial encounter for closed fracture: Principal | ICD-10-CM | POA: Diagnosis present

## 2011-05-25 DIAGNOSIS — J4489 Other specified chronic obstructive pulmonary disease: Secondary | ICD-10-CM | POA: Diagnosis present

## 2011-05-25 DIAGNOSIS — F3289 Other specified depressive episodes: Secondary | ICD-10-CM | POA: Diagnosis present

## 2011-05-25 DIAGNOSIS — F329 Major depressive disorder, single episode, unspecified: Secondary | ICD-10-CM | POA: Diagnosis present

## 2011-05-25 DIAGNOSIS — R82998 Other abnormal findings in urine: Secondary | ICD-10-CM | POA: Diagnosis present

## 2011-05-25 DIAGNOSIS — G4733 Obstructive sleep apnea (adult) (pediatric): Secondary | ICD-10-CM | POA: Diagnosis present

## 2011-05-25 DIAGNOSIS — E785 Hyperlipidemia, unspecified: Secondary | ICD-10-CM | POA: Diagnosis present

## 2011-05-26 ENCOUNTER — Inpatient Hospital Stay (HOSPITAL_COMMUNITY): Payer: Medicare Other

## 2011-05-26 LAB — COMPREHENSIVE METABOLIC PANEL
ALT: 20 U/L (ref 0–35)
AST: 22 U/L (ref 0–37)
Albumin: 3.7 g/dL (ref 3.5–5.2)
Albumin: 3.6 g/dL (ref 3.5–5.2)
Alkaline Phosphatase: 110 U/L (ref 39–117)
BUN: 18 mg/dL (ref 6–23)
CO2: 27 mEq/L (ref 19–32)
CO2: 26 mEq/L (ref 19–32)
Calcium: 9.1 mg/dL (ref 8.4–10.5)
Creatinine, Ser: 0.64 mg/dL (ref 0.50–1.10)
GFR calc Af Amer: >60 mL/min (ref >60)
GFR calc non Af Amer: >60 mL/min (ref >60)
Potassium: 3.9 mEq/L (ref 3.5–5.1)
Potassium: 4.1 mEq/L (ref 3.5–5.1)
Sodium: 136 mEq/L (ref 135–145)
Total Bilirubin: 0.2 mg/dL — ABNORMAL LOW (ref 0.3–1.2)
Total Protein: 6.3 g/dL (ref 6.0–8.3)

## 2011-05-26 LAB — CBC
HCT: 36.5 % (ref 36.0–46.0)
Hemoglobin: 12.6 g/dL (ref 12.0–15.0)
MCH: 30.4 pg (ref 26.0–34.0)
MCH: 29.9 pg (ref 26.0–34.0)
MCHC: 34.9 g/dL (ref 30.0–36.0)
MCHC: 34.5 g/dL (ref 30.0–36.0)
Platelets: 350 10*3/uL (ref 150–400)
RBC: 4.21 MIL/uL (ref 3.87–5.11)
RDW: 12.4 % (ref 11.5–15.5)
RDW: 12.4 % (ref 11.5–15.5)
WBC: 14.5 10*3/uL — ABNORMAL HIGH (ref 4.0–10.5)

## 2011-05-26 LAB — DIFFERENTIAL
Basophils Absolute: 0 10*3/uL (ref 0.0–0.1)
Eosinophils Relative: 0 % (ref 0–5)
Lymphs Abs: 2 10*3/uL (ref 0.7–4.0)
Monocytes Absolute: 0.8 10*3/uL (ref 0.1–1.0)
Neutro Abs: 11.7 10*3/uL — ABNORMAL HIGH (ref 1.7–7.7)

## 2011-05-26 LAB — URINALYSIS, ROUTINE W REFLEX MICROSCOPIC
Bilirubin Urine: NEGATIVE
Glucose, UA: 100 mg/dL — AB
Nitrite: NEGATIVE
Protein, ur: NEGATIVE mg/dL
Specific Gravity, Urine: 1.014 (ref 1.005–1.030)
pH: 5.5 (ref 5.0–8.0)

## 2011-05-26 LAB — PROTIME-INR: INR: 1 (ref 0.00–1.49)

## 2011-05-26 LAB — POCT I-STAT TROPONIN I: Troponin i, poc: 0 ng/mL (ref 0.00–0.08)

## 2011-05-26 LAB — CARDIAC PANEL(CRET KIN+CKTOT+MB+TROPI): Troponin I: <0.3 ng/mL (ref <0.30)

## 2011-05-27 LAB — CBC
HCT: 37 % (ref 36.0–46.0)
Hemoglobin: 12.4 g/dL (ref 12.0–15.0)
MCH: 29.7 pg (ref 26.0–34.0)
MCV: 88.7 fL (ref 78.0–100.0)
RBC: 4.17 MIL/uL (ref 3.87–5.11)
WBC: 9.9 10*3/uL (ref 4.0–10.5)

## 2011-05-27 LAB — URINE CULTURE
Colony Count: 50000
Culture  Setup Time: 201209192249

## 2011-05-28 LAB — URINE CULTURE: Special Requests: NEGATIVE

## 2011-06-01 NOTE — Consult Note (Signed)
Linda Morse, Linda Morse           ACCOUNT NO.:  [id number]  MEDICAL RECORD NO.:  [id number]  LOCATION:  5121                         FACILITY:  [facility]  PHYSICIAN:  Jones Broom, MD    DATE OF BIRTH:  02-Nov-1933  DATE OF CONSULTATION:  05/26/2011 DATE OF DISCHARGE:                                CONSULTATION   CONSULTING PHYSICIAN:  Eduard Clos, MD  REASON FOR CONSULTATION:  Left patella fracture.  HISTORY OF PRESENT ILLNESS:  Linda Morse is a very pleasant 77 year old female with history of COPD, ongoing tobacco use, and OSA who had a fall at home in the garage on May 25, 2011.  She was seen in [facility] where she was diagnosed with transverse patellar fracture on the left side.  I was consulted for evaluation and management.  She notes moderate to severe pain in the left knee, anterior deep in the knee worse with movement, better with rest.  She is not able to walk on that side.  She has been on a knee immobilizer which helps.  No numbness or tingling.  Denies other injury.  PAST MEDICAL HISTORY:  OSA, COPD, hyperlipidemia, and depression.  PAST SURGICAL HISTORY:  C spine surgery and low back surgery and hysterectomy.  MEDICATIONS:  Vitamin D3, sertraline, Lipitor.  ALLERGIES:  NKDA.  FAMILY HISTORY:  Noncontributory.  SOCIAL HISTORY:  She smokes tobacco.  Denies alcohol or drug use.  She lives alone.  REVIEW OF SYSTEMS:  As above otherwise negative.  PHYSICAL EXAM:  VITAL SIGNS: On exam, she is afebrile.  Vital signs are stable. GENERAL: She is alert and oriented x3. HEENT: Extraocular motions intact. RESPIRATORY: No use of accessory respiratory muscles for breathing. SKIN:  No skin rash.  No skin abrasions. EXTREMITIES: Examination of the left knee demonstrates large knee effusion with tenderness over the anterior patella with a small palpable gap.  Skin is intact.  She is unable to perform a straight leg raise today  which appears to be probably mediated by pain.  Distally, she is neurovascularly intact, wiggle her toes up and down without difficulty, and has normal sensation to light touch on dorsal and plantar aspect of the foot with capillary refill less than 2 seconds.  DIAGNOSTIC STUDIES:  X-rays including four views left knee demonstrate a transverse patellar fracture which has up to 6 mm of separation with about 2 mm step-off at the articular surface.  IMPRESSION AND PLAN:  She has a transverse patellar fracture but clearly has some intact retinaculum holding this together as it is not widely displaced.  This is one that is on the border, we are unable to treat conservatively versus surgically, and I think for now, we will put her on a knee immobilizer and let her touchdown weight bear on the this.  I am going to see her back in my office next week to repeat x-rays, and certainly if she has had any further displacement, then this is going to need to be fixed.  We can also recheck her clinically, and if she is able to perform straight leg raise with the entire retinaculum, I think that will be another reason to consider continuing with nonoperative  management.  All their questions were answered.  She will be discharged when she clears PT and will follow up with me early next week for recheck.     Jones Broom, MD     JC/MEDQ  D:  05/27/2011  T:  05/27/2011  Job:  [id number]  Electronically Signed by Jones Broom  on 06/01/2011 02:02:33 PM

## 2011-06-03 ENCOUNTER — Ambulatory Visit (HOSPITAL_BASED_OUTPATIENT_CLINIC_OR_DEPARTMENT_OTHER)
Admission: RE | Admit: 2011-06-03 | Discharge: 2011-06-03 | Disposition: A | Discharge status: Home / Self Care | Payer: Medicare Other | Source: Ambulatory Visit | Attending: Orthopedic Surgery | Admitting: Orthopedic Surgery

## 2011-06-03 ENCOUNTER — Ambulatory Visit (HOSPITAL_COMMUNITY): Payer: Medicare Other

## 2011-06-03 ENCOUNTER — Ambulatory Visit (HOSPITAL_COMMUNITY): Payer: Medicare Other | Attending: Orthopedic Surgery

## 2011-06-03 DIAGNOSIS — X58XXXA Exposure to other specified factors, initial encounter: Secondary | ICD-10-CM | POA: Insufficient documentation

## 2011-06-03 DIAGNOSIS — J4489 Other specified chronic obstructive pulmonary disease: Secondary | ICD-10-CM | POA: Insufficient documentation

## 2011-06-03 DIAGNOSIS — J449 Chronic obstructive pulmonary disease, unspecified: Secondary | ICD-10-CM | POA: Insufficient documentation

## 2011-06-03 DIAGNOSIS — S82009A Unspecified fracture of unspecified patella, initial encounter for closed fracture: Secondary | ICD-10-CM | POA: Insufficient documentation

## 2011-06-03 DIAGNOSIS — Y92009 Unspecified place in unspecified non-institutional (private) residence as the place of occurrence of the external cause: Secondary | ICD-10-CM | POA: Insufficient documentation

## 2011-06-03 DIAGNOSIS — W19XXXA Unspecified fall, initial encounter: Secondary | ICD-10-CM | POA: Insufficient documentation

## 2011-06-03 DIAGNOSIS — F17200 Nicotine dependence, unspecified, uncomplicated: Secondary | ICD-10-CM | POA: Insufficient documentation

## 2011-06-06 NOTE — H&P (Signed)
Linda Morse, Linda Morse           ACCOUNT NO.:  [id number]  MEDICAL RECORD NO.:  [id number]  LOCATION:  [facility]                         FACILITY:  [facility]  PHYSICIAN:  Eduard Clos, MDDATE OF BIRTH:  05-11-1934  DATE OF ADMISSION:  05/25/2011 DATE OF DISCHARGE:                             HISTORY & PHYSICAL   PRIMARY CARE PHYSICIAN:  Dr. Zachery Dauer at [facility].  CHIEF COMPLAINT:  Fall.  HISTORY OF PRESENT ILLNESS:  A 77 year old female with history of COPD, ongoing tobacco abuse, previous history of OSA, not using her CPAP, hyperlipidemia and the patient had a fall today at her home in the garage.  She lives alone.  Since her fall, she has been having left knee pain.  She came to the ER, had x-rays done which shows transverse fracture of the patella with mildly comminuted with some hemarthrosis. At this time the patient is unable to walk even with the help of clutches and the patient stays alone.  So the patient has been admitted for further observation and for evaluation for a safe discharge.  The patient's daughter is going to be available for help on this weekend.  The patient denies any chest pain, short of breath, loss of consciousness, did not hit her head, did not have any dizziness, did not have any focal deficit.  Denies any headache, visual symptoms.  Denies any nausea, vomiting, abdominal pain, dysuria, discharge, or diarrhea.  PAST MEDICAL HISTORY:  History of OSA, COPD, hyperlipidemia, depression.  PAST SURGICAL HISTORY:  Cervical spine surgery and low back surgery, hysterectomy.  MEDICATIONS PER ADMISSION:  The patient is on; 1. Vitamin D3. 2. Sertraline 50 mg p.o. daily. 3. Lipitor 10 mg p.o. daily.  ALLERGIES:  No known drug allergies.  FAMILY HISTORY:  Noncontributory.  SOCIAL HISTORY:  The patient smokes cigarettes.  Denies alcohol, drug abuse.  Lives alone.  REVIEW OF SYSTEMS:  As per history of present illness nothing  else significant.  PHYSICAL EXAMINATION:  GENERAL:  The patient examined at bedside not in acute distress. VITAL SIGNS:  Blood pressure 115/70, pulse is 58 per minute, temperature 97.4, respiration 18 per minute, O2 sat 100%. HEENT:  Anicteric.  No pallor.  No discharge from ears, eyes, nose or mouth. CHEST:  Bilateral air entry present.  No rhonchi, no crepitation. HEART:  S1 and S2 heard. ABDOMEN:  Soft, nontender.  Bowel sounds heard. CNS:  The patient is alert, awake, and oriented to time, place, and person, moves upper and lower extremities, 5/5.  The left lower extremity movement has decreased because of the pain in the left knee. EXTREMITIES:  Left knee is swollen, mildly erythematous.  The patient has difficulty flexing the left knee.  Rest of the extremities looks normal at this time with no acute ischemic changes, cyanosis or clubbing.  LABS:  X-ray of the left knee shows mildly comminuted transverse fracture of the patella with 5 mm distraction and hemarthrosis.  X-ray of the left tibia and fibula shows no acute fracture or dislocation have been identified.  Both the left tibia and patellar fracture have new series.  X-ray of the left femur shows patella fracture.  No evidence of left femur abnormality.  CMET,  CBC, cardiac enzymes have been ordered.  ASSESSMENT: 1. Difficulty walking secondary to fall with fracture of the patella,     left sided. 2. History of hyperlipemia. 3. History of depression. 4. History of chronic obstructive pulmonary disease with ongoing     tobacco abuse. 5. History of obstructive sleep apnea, not using a CPAP.  PLAN: 1. At this time we will admit the patient to medical floor. 2. For left patellar fracture, at this time the patient has been     placed on brace.  We will consult PT, OT and also we will get an     opinion from Orthopedic.  At this time the patient has difficulty     walking without clutches and has no help at home.  We will  get     Social Work consult and we will also plan of discharge based on the     orthopedic consult and help at home further recommendation based on     clinical course.     Eduard Clos, MD     ANK/MEDQ  D:  05/25/2011  T:  05/26/2011  Job:  [id number]  cc:   Dr. Zachery Dauer  Electronically Signed by Midge Minium MD on 06/06/2011 11:59:25 AM

## 2011-06-15 NOTE — Op Note (Signed)
Linda Morse, KISSINGER           ACCOUNT NO.:  [id number]  MEDICAL RECORD NO.:  [id number]  LOCATION:                                 FACILITY:  PHYSICIAN:  Jones Broom, MD    DATE OF BIRTH:  [birth year]  DATE OF PROCEDURE:  06/03/2011 DATE OF DISCHARGE:                              OPERATIVE REPORT   PREOPERATIVE DIAGNOSIS:  Left transverse patella fracture.  POSTOPERATIVE DIAGNOSIS:  Left transverse patella fracture.  PROCEDURE PERFORMED:  Open reduction and internal fixation, left transverse patella fracture.  ATTENDING SURGEON:  Jones Broom, MD  ASSISTANT:  None.  ANESTHESIA:  GETA.  COMPLICATIONS:  None.  DRAINS:  None.  SPECIMENS:  None.  ESTIMATED BLOOD LOSS:  Minimal.  TOURNIQUET TIME:  54 minutes at 350 mmHg.  INDICATIONS FOR SURGERY:  The patient is a 77 year old female who had a fall in her garage and suffered a left transverse patella fracture.  She was initially observed.  Due to the fact that there was minimal displacement, it was felt that the retinaculum was intact.  However, on re-x-ray there was articular step-off which was felt to be unacceptable and anterior gapping.  She was indicated for operative treatment.  She understood risks, benefits, and alternatives of procedure including but not limited to risk of bleeding, infection, damage to neurovascular structures, and risk of anesthesia.  She also understood risks of nonunion, malunion, potential need for future hardware removal.  We counseled her on smoking cessation as well.  OPERATIVE FINDINGS:  Anatomic reduction of the fracture using two 4-0 partially threaded cancellous cannulated screws with a #2 FiberWire tension band configuration using two #2 FiberWire.  PROCEDURE:  The patient was identified in the preoperative holding area where I personally marked the operative site after verifying site side and procedure with the patient.  She was taken back to OR #6 where general  anesthesia was induced without complication.  The left lower extremity was prepped and draped in a standard sterile fashion after a nonsterile tourniquet was applied to the upper thigh.  The appropriate time-out procedure was carried out and the patient did receive IV antibiotics prior to elevation of the tourniquet.  Limb was exsanguinated using an Esmarch dressing and the tourniquet was elevated to 350 mmHg.  An approximately 6-cm incision was made centered over the patella and dissection was carried down to the anterior surface of the patella.  The retinaculum was noted to be completely intact but she did have transverse fracture which was gapped open anteriorly about 8 mm.  I used a knife to clean the anterior edges of the fracture.  I did visualize the cortical keys.  I did use a small curette to remove hematoma from the fracture site and used a Therapist, nutritional to gently manipulate the fracture to reduce the articular surface to an anatomic position.  I then used a large pointed reduction forceps to hold the reduction and the reduction was verified in AP and lateral fluoroscopic planes.  Guidewires from the 4-0 cannulated screw set were then advanced proximal to distal in a parallel fashion centrally in the patella.  The position was verified in AP and lateral fluoroscopic imaging and the measuring device was  used to measure the appropriate screw length.  The guidewires were then advanced distally and held in place with a hemostat while the drill was used to over drill.  Screws were then advanced over the guidewires obtaining reasonable purchase.  A [place] suture passer was then used to pass two #2 FiberWire in a figure-of-eight fashion through each screw and each were tied over anteriorly creating a tension band construct.  The final fluoroscopic imaging demonstrated anatomic reduction in AP and lateral planes and the wound was then copiously irrigated with normal saline and  subsequently closed in layers with 2-0 Vicryl in deep dermal layer of 4-0 Monocryl for skin closure.  No retinacular closure was necessary.  Sterile dressings were then applied including Steri-Strips, 4x4s, ABDs, and a loosely wrapped Ace bandage. She was placed back in a knee immobilizer and taken to the recovery room in stable condition.  POSTOPERATIVE PLAN:  She will be discharged home with her daughter today.  She will be touchdown weightbearing in a knee immobilizer.  She will follow up in about 7-10 days at which point we will get her into a hinged knee brace.     Jones Broom, MD   ______________________________ Jones Broom, MD    JC/MEDQ  D:  06/03/2011  T:  06/03/2011  Job:  [id number]  Electronically Signed by Jones Broom  on 06/15/2011 09:30:10 AM

## 2011-06-16 NOTE — Discharge Summary (Signed)
[name], [name]           ACCOUNT NO.:  [id number]  MEDICAL RECORD NO.:  [id number]  LOCATION:  5121                         FACILITY:  [facility]  PHYSICIAN:  Baltazar Najjar, MD     DATE OF BIRTH:  [birth year]  DATE OF ADMISSION:  05/25/2011 DATE OF DISCHARGE:                              DISCHARGE SUMMARY   FINAL DISCHARGE DIAGNOSES: 1. Status post fall. 2. Left patellar fracture. 3. History of chronic obstructive pulmonary disease, stable. 4. Pyuria. 5. History of obstructive sleep apnea. 6. Tobacco abuse. 7. Leukocytosis on presentation, resolved.  CONSULTATIONS DURING THIS HOSPITALIZATION:  Orthopedic Service.  RADIOLOGY/IMAGING STUDY: 1. Chest x-ray showed no acute cardiopulmonary process. 2. Left knee x-ray showed mildly comminuted transverse fracture of the     patella with 5-mm distractions and hemarthrosis. 3. Left tibia/fibula x-ray showed no acute fracture or dislocation     identified about the left tibia and fibula. 4. Left femur x-ray showed patellar fracture and no evidence of left     femur abnormality.  BRIEF ADMITTING HISTORY:  Please refer to H and P for more details.  In summary, [name] is a 77 year old very pleasant woman with the above history brought into the ER on May 25, 2011 after she sustained a fall at home.  There was no preceding dizziness or lightheadedness, no chest pain or shortness of breath, no neurological symptoms, and it sounds like it was a mechanical fall, please refer to H and P for more details.  HOSPITAL COURSE:  Workup in the ED showed evidence of left patellar fracture, the patient was seen by Orthopedic Service and left knee was immobilized and the patient was treated with oxycodone for pain control. She was seen by Physical Therapy and recommendation was to discharge home with home PT/OT.  There was no surgical intervention recommended by Orthopedic Service during this hospitalization.  However,  arrangements were made for hospice to follow up with Orthopedics on Monday for repeat x-rays and the decision will be made based on the results.  If there is any worsening and increased displacement of the fracture, she might need open reduction and internal fixation.  Appointment was made for May 31, 2011 on 10:00 a.m. with Orthopedic Clinic.  The patient was noted to have mild leukocytosis.  On presentation, her white blood count was 14.5 and then trended down to 12.8 the next day. Urinalysis was sent and that showed evidence of pyuria.  Her wbc's on the urinalysis were 21-50 and she did have large leukocytes.  We will discharge the patient on ciprofloxacin 500 mg for 3 days.  Urine culture will be sent prior to discharge and she will need to follow with her PCP for followup of urine culture results.  The patient denies any symptoms to suggest UTI.  At this time, there is no burning micturition, no flank pain, no fever or chills.  As she admitted that she has longstanding history of on and off difficulty with urinating and difficulty emptying her bladder.  However, this has been going on for years and that is not new.  As I said, the patient will be discharged on ciprofloxacin course of 3 days and further adjustment will depend  on the result of the urine culture which is still pending at the time of dictation.  The patient remained stable otherwise.  She was seen and examined by me today feeling better and eager to go home.  Denies any pain, any fever, chills or shortness of breath, and she is medically stable for discharge.  DISCHARGE MEDICATIONS: 1. Oxycodone 5 mg IR tablet q.4 h. p.r.n., we will provide her for 5     days supply and that can be renewed if felt necessary by Orthopedic     Service on Monday. 2. Ciprofloxacin 500 mg ER 1 tablet p.o. daily for 3 days. 3. Lipitor 10 mg 1 tablet p.o. q.p.m. 4. Sertraline 50 mg 1 tablet p.o. q.p.m. 5. Vitamin D3 400 units 2  tablets p.o. q.p.m.  DISCHARGE INSTRUCTIONS: 1. Continue above medications as prescribed. 2. Follow with Orthopedic Clinic as arranged and follow with PCP     within 1 week of discharge. 3. [facility] was already arranged and all equipments     recommended were ordered. 4. Please follow urine culture result which was still pending at the     time of dictation. 5. The patient to report any worsening of symptoms or any new symptoms     to her PCP or come to the ED if needed.  CONDITION ON DISCHARGE:  Stable/improved.          ______________________________ Baltazar Najjar, MD     SA/MEDQ  D:  05/27/2011  T:  05/27/2011  Job:  [id number]  cc:   Dossie Der, MD Jones Broom, MD  Electronically Signed by Hannah Beat MD on 06/16/2011 09:10:15 PM

## 2012-05-10 ENCOUNTER — Ambulatory Visit: Payer: BC Managed Care – PPO | Admitting: Physical Therapy

## 2012-05-16 ENCOUNTER — Ambulatory Visit: Payer: Medicare Other | Attending: Family Medicine | Admitting: *Deleted

## 2012-05-16 DIAGNOSIS — R269 Unspecified abnormalities of gait and mobility: Secondary | ICD-10-CM | POA: Insufficient documentation

## 2012-05-16 DIAGNOSIS — IMO0001 Reserved for inherently not codable concepts without codable children: Secondary | ICD-10-CM | POA: Insufficient documentation

## 2012-05-26 ENCOUNTER — Ambulatory Visit: Payer: Medicare Other | Admitting: Physical Therapy

## 2012-05-29 ENCOUNTER — Ambulatory Visit: Payer: BC Managed Care – PPO | Admitting: Physical Therapy

## 2012-05-29 ENCOUNTER — Ambulatory Visit: Payer: Medicare Other | Admitting: Physical Therapy

## 2012-05-31 ENCOUNTER — Ambulatory Visit: Payer: Medicare Other | Admitting: *Deleted

## 2012-06-05 ENCOUNTER — Ambulatory Visit: Payer: Medicare Other | Admitting: Physical Therapy

## 2012-06-07 ENCOUNTER — Ambulatory Visit: Payer: Medicare Other | Attending: Family Medicine | Admitting: Physical Therapy

## 2012-06-07 DIAGNOSIS — IMO0001 Reserved for inherently not codable concepts without codable children: Secondary | ICD-10-CM | POA: Insufficient documentation

## 2012-06-07 DIAGNOSIS — R269 Unspecified abnormalities of gait and mobility: Secondary | ICD-10-CM | POA: Insufficient documentation

## 2012-06-12 ENCOUNTER — Ambulatory Visit: Payer: Medicare Other | Admitting: *Deleted

## 2012-06-14 ENCOUNTER — Ambulatory Visit: Payer: Medicare Other | Admitting: *Deleted

## 2012-06-20 ENCOUNTER — Ambulatory Visit: Payer: Medicare Other | Admitting: *Deleted

## 2012-06-22 ENCOUNTER — Encounter: Payer: BC Managed Care – PPO | Admitting: *Deleted

## 2012-06-27 ENCOUNTER — Ambulatory Visit: Payer: Medicare Other | Admitting: *Deleted

## 2012-06-29 ENCOUNTER — Ambulatory Visit: Payer: Medicare Other | Admitting: *Deleted

## 2012-07-04 ENCOUNTER — Ambulatory Visit: Payer: Medicare Other | Admitting: *Deleted

## 2012-07-06 ENCOUNTER — Ambulatory Visit: Payer: Medicare Other | Admitting: *Deleted

## 2012-07-10 ENCOUNTER — Ambulatory Visit: Payer: Medicare Other | Attending: Family Medicine | Admitting: *Deleted

## 2012-07-10 DIAGNOSIS — R269 Unspecified abnormalities of gait and mobility: Secondary | ICD-10-CM | POA: Insufficient documentation

## 2012-07-10 DIAGNOSIS — IMO0001 Reserved for inherently not codable concepts without codable children: Secondary | ICD-10-CM | POA: Insufficient documentation

## 2012-07-20 ENCOUNTER — Ambulatory Visit: Payer: Medicare Other | Admitting: *Deleted

## 2012-07-24 ENCOUNTER — Ambulatory Visit: Payer: Medicare Other | Admitting: Physical Therapy

## 2012-07-26 ENCOUNTER — Ambulatory Visit: Payer: Medicare Other | Admitting: Physical Therapy

## 2012-07-31 ENCOUNTER — Ambulatory Visit: Payer: Medicare Other | Admitting: Physical Therapy

## 2012-08-02 ENCOUNTER — Ambulatory Visit: Payer: Medicare Other | Admitting: Physical Therapy

## 2013-06-10 ENCOUNTER — Encounter (HOSPITAL_COMMUNITY): Payer: Self-pay | Admitting: *Deleted

## 2013-06-10 ENCOUNTER — Emergency Department (HOSPITAL_COMMUNITY)
Admission: EM | Admit: 2013-06-10 | Discharge: 2013-06-10 | Disposition: A | Discharge status: Home / Self Care | Payer: Medicare Other | Attending: Emergency Medicine | Admitting: Emergency Medicine

## 2013-06-10 ENCOUNTER — Emergency Department (HOSPITAL_COMMUNITY): Payer: Medicare Other

## 2013-06-10 DIAGNOSIS — Y9389 Activity, other specified: Secondary | ICD-10-CM | POA: Insufficient documentation

## 2013-06-10 DIAGNOSIS — Z79899 Other long term (current) drug therapy: Secondary | ICD-10-CM | POA: Insufficient documentation

## 2013-06-10 DIAGNOSIS — Z7982 Long term (current) use of aspirin: Secondary | ICD-10-CM | POA: Insufficient documentation

## 2013-06-10 DIAGNOSIS — S52611A Displaced fracture of right ulna styloid process, initial encounter for closed fracture: Secondary | ICD-10-CM

## 2013-06-10 DIAGNOSIS — F17200 Nicotine dependence, unspecified, uncomplicated: Secondary | ICD-10-CM | POA: Insufficient documentation

## 2013-06-10 DIAGNOSIS — S5291XA Unspecified fracture of right forearm, initial encounter for closed fracture: Secondary | ICD-10-CM

## 2013-06-10 DIAGNOSIS — W1789XA Other fall from one level to another, initial encounter: Secondary | ICD-10-CM | POA: Insufficient documentation

## 2013-06-10 DIAGNOSIS — S52509A Unspecified fracture of the lower end of unspecified radius, initial encounter for closed fracture: Secondary | ICD-10-CM | POA: Insufficient documentation

## 2013-06-10 DIAGNOSIS — Y9289 Other specified places as the place of occurrence of the external cause: Secondary | ICD-10-CM | POA: Insufficient documentation

## 2013-06-10 DIAGNOSIS — W010XXA Fall on same level from slipping, tripping and stumbling without subsequent striking against object, initial encounter: Secondary | ICD-10-CM | POA: Insufficient documentation

## 2013-06-10 DIAGNOSIS — E7800 Pure hypercholesterolemia, unspecified: Secondary | ICD-10-CM | POA: Insufficient documentation

## 2013-06-10 HISTORY — DX: Pure hypercholesterolemia, unspecified: E78.00

## 2013-06-10 MED ORDER — ONDANSETRON 4 MG PO TBDP
8.0000 mg | ORAL_TABLET | Freq: Once | ORAL | Status: AC
  Administered 2013-06-10: 8 mg via ORAL
  Filled 2013-06-10: qty 2

## 2013-06-10 MED ORDER — FENTANYL CITRATE 0.05 MG/ML IJ SOLN
100.0000 ug | INTRAMUSCULAR | Status: DC | PRN
  Administered 2013-06-10 (×2): 100 ug via NASAL
  Filled 2013-06-10 (×2): qty 2

## 2013-06-10 NOTE — ED Notes (Signed)
Ortho tech at bedside 

## 2013-06-10 NOTE — ED Notes (Signed)
Pt tripped over foot stool and caught self with right hand, pt has wrist deformity to right side and moves all digits

## 2013-06-10 NOTE — ED Notes (Signed)
Placed on O2 @ 2 LPM 

## 2013-06-10 NOTE — ED Provider Notes (Signed)
CSN: [id number]     Arrival date & time 06/10/13  4098 History   First MD Initiated Contact with Patient 06/10/13 1006     Chief Complaint  Patient presents with  . Wrist Pain   (Consider location/radiation/quality/duration/timing/severity/associated sxs/prior Treatment) HPI Comments: Linda Morse is a 79 y.o. Female who states that she stepped backwards and tripped over a short stool. This caused her to fall whereupon she hurt the right wrist. She did not injure her head, neck, or back. She was able to ambulate, get rest and come to the emergency room by private vehicle. She has been otherwise well recently. There have been no recent illnesses. The pain in the right wrist is worse with palpation and movement. There are no other known modifying factors.  Patient is a 79 y.o. female presenting with wrist pain. The history is provided by the patient.  Wrist Pain    Past Medical History  Diagnosis Date  . Hypercholesterolemia    Past Surgical History  Procedure Laterality Date  . Back surgery    . Knee surgery     No family history on file. History  Substance Use Topics  . Smoking status: Current Every Day Smoker  . Smokeless tobacco: Not on file  . Alcohol Use: No   OB History   Grav Para Term Preterm Abortions TAB SAB Ect Mult Living                 Review of Systems  All other systems reviewed and are negative.    Allergies  Review of patient's allergies indicates no known allergies.  Home Medications   Current Outpatient Rx  Name  Route  Sig  Dispense  Refill  . aspirin EC 81 MG tablet   Oral   Take 81 mg by mouth daily.         Marland Kitchen atorvastatin (LIPITOR) 40 MG tablet   Oral   Take 40 mg by mouth daily.         . beta carotene w/minerals (OCUVITE) tablet   Oral   Take 1 tablet by mouth daily.         . Cholecalciferol (D3-1000) 1000 UNITS capsule   Oral   Take 1,000 Units by mouth daily.         . Cyanocobalamin (B-12 SL)   Sublingual  Place under the tongue.         . Pyridoxine HCl (B-6 PO)   Oral   Take by mouth.         . sertraline (ZOLOFT) 50 MG tablet   Oral   Take 50 mg by mouth daily.          BP 144/59  Pulse 57  Temp(Src) 98.5 F (36.9 C) (Oral)  Resp 18  SpO2 96% Physical Exam  Nursing note and vitals reviewed. Constitutional: She is oriented to person, place, and time. She appears well-developed and well-nourished.  HENT:  Head: Normocephalic and atraumatic.  Eyes: Conjunctivae and EOM are normal. Pupils are equal, round, and reactive to light.  Neck: Normal range of motion and phonation normal. Neck supple.  Cardiovascular: Normal rate.   Pulmonary/Chest: Effort normal. No respiratory distress. She exhibits no tenderness.  Musculoskeletal: Normal range of motion.  Right wrist tender and deformed, with volar apex deformity. Neurovascular intact distally in the right fingers. Normal range of motion. Right shoulder and right elbow. No focal tenderness of the right elbow or right forearm.  Neurological: She is alert and oriented  to person, place, and time. She exhibits normal muscle tone.  Skin: Skin is warm and dry.  Psychiatric: She has a normal mood and affect. Her behavior is normal. Judgment and thought content normal.    ED Course  Procedures (including critical care time)  Medications  fentaNYL (SUBLIMAZE) injection 100 mcg (100 mcg Nasal Given 06/10/13 1122)  ondansetron (ZOFRAN-ODT) disintegrating tablet 8 mg (8 mg Oral Given 06/10/13 1020)    11:12 AM Reevaluation with update and discussion. After initial assessment and treatment, an updated evaluation reveals some better after first dose of Fentanyl, will give second dose. She prefers Dr Amanda Pea to see her for the FX. Dj Senteno L   11:35- I discussed the case with Dr. Ranell Patrick, who is on call for Dr. Amanda Pea, he will come to the emergency department and reduce the fracture and arrange followup care. The patient is more  comfortable. Now after the second dose of fentanyl. She had somewhat lower. Oxygen saturation to 88% on room air, so she was placed on oxygen, and 2 L by nasal cannula with improvement to 94%.  Labs Review Labs Reviewed - No data to display Imaging Review Dg Wrist Complete Right  06/10/2013   CLINICAL DATA:  Fall with right wrist deformity and pain.  EXAM: RIGHT WRIST - COMPLETE 3+ VIEW  COMPARISON:  None.  FINDINGS: Acute fracture of the distal radial metaphysis shows impaction as well as significant dorsal angulation. There is associated avulsive fracture of the ulnar styloid with minimal displacement. No carpal bone fracture is identified. Marked degenerative changes are seen involving the [facility] joint.  IMPRESSION: Distal radial fracture demonstrating significant dorsal angulation. Associated avulsion fracture of the ulnar styloid present.   Electronically Signed   By: Irish Lack M.D.   On: 06/10/2013 10:49    MDM   1. Radius fracture, right, closed, initial encounter   2. Ulna styloid fracture, closed, right, initial encounter      Mechanical fall with isolated injury right wrist. Mild hypoxia in the emergency department after treatment with intranasal fentanyl. This should improve with time after completion of her treatment by orthopedics.    Nursing Notes Reviewed/ Care Coordinated, and agree without changes. Applicable Imaging Reviewed. Radiologic imaging report reviewed and images by radiography  - viewed, by me. Interpretation of Laboratory Data incorporated into ED treatment  Plan: as per Orthopedics  Flint Melter, MD 06/10/13 [phone]

## 2013-06-10 NOTE — ED Notes (Signed)
Supplies at bedside.

## 2013-06-10 NOTE — ED Notes (Signed)
PT RT arm warm, Pt moves all digits on RT hand and skin is warm to touch.

## 2013-06-10 NOTE — ED Notes (Addendum)
DR. Ranell Patrick at bed side.

## 2013-06-10 NOTE — Progress Notes (Signed)
Orthopedic Tech Progress Note Patient Details:  Linda Morse 1933/10/27 [id number]  Ortho Devices Type of Ortho Device: Sugartong splint;Arm sling Ortho Device/Splint Interventions: Application   Cammer, Mickie Bail 06/10/2013, 12:59 PM

## 2013-06-10 NOTE — ED Notes (Signed)
Ice to wrist °

## 2013-06-10 NOTE — Consult Note (Signed)
Reason for Consult:Broken right wrist Referring Physician: EDP  Linda Morse is an 79 y.o. female.  HPI: 79 yo RHD female patient of Dr Onalee Hua s/p FOOSH this morning with severe right wrist pain and deformity.  Transported to the [facility] ED for eval and treatment.  Past Medical History  Diagnosis Date  . Hypercholesterolemia     Past Surgical History  Procedure Laterality Date  . Back surgery    . Knee surgery      No family history on file.  Social History:  reports that she has been smoking.  She does not have any smokeless tobacco history on file. She reports that she does not drink alcohol or use illicit drugs.  Allergies: No Known Allergies  Medications: I have reviewed the patient's current medications.  No results found for this or any previous visit (from the past 48 hour(s)).  Dg Wrist Complete Right  06/10/2013   CLINICAL DATA:  Fall with right wrist deformity and pain.  EXAM: RIGHT WRIST - COMPLETE 3+ VIEW  COMPARISON:  None.  FINDINGS: Acute fracture of the distal radial metaphysis shows impaction as well as significant dorsal angulation. There is associated avulsive fracture of the ulnar styloid with minimal displacement. No carpal bone fracture is identified. Marked degenerative changes are seen involving the [facility]st [facility] joint.  IMPRESSION: Distal radial fracture demonstrating significant dorsal angulation. Associated avulsion fracture of the ulnar styloid present.   Electronically Signed   By: Irish Lack M.D.   On: 06/10/2013 10:49    ROS Blood pressure 144/59, pulse 57, temperature 98.5 F (36.9 C), temperature source Oral, resp. rate 18, SpO2 96.00%. Physical Exam  Right wrist with dorsal angulation and swelling.  Decreased finger ROM due to pain. Skin intact NVI,  Left UE and bilateral LEs with painfree ROM  Assessment/Plan: Displaced right distal radius fracture  Procedure:  Right wrist reduced in ED under hematoma block and use of finger traps.   Molded sugar tong splint applied.  Post reduction XRAYs show improved fracture position.    Plan D/C to home with instructions to follow up with Dr Onalee Hua later this week for re check and XRAY to assess fracture position.  If fracture drifts due to comminution, I counseled the patient that she may need ORIF.  Linda Morse,STEVEN R 06/10/2013, 12:44 PM

## 2013-06-14 ENCOUNTER — Encounter (HOSPITAL_COMMUNITY): Payer: Self-pay | Admitting: Pharmacy Technician

## 2013-06-15 ENCOUNTER — Other Ambulatory Visit: Payer: Self-pay | Admitting: Orthopedic Surgery

## 2013-06-15 ENCOUNTER — Encounter (HOSPITAL_COMMUNITY): Payer: Self-pay | Admitting: *Deleted

## 2013-06-16 ENCOUNTER — Ambulatory Visit (HOSPITAL_COMMUNITY): Payer: Medicare Other | Admitting: Anesthesiology

## 2013-06-16 ENCOUNTER — Encounter (HOSPITAL_COMMUNITY)
Admission: RE | Disposition: A | Discharge status: Home / Self Care | Payer: Self-pay | Source: Ambulatory Visit | Attending: Orthopedic Surgery

## 2013-06-16 ENCOUNTER — Observation Stay (HOSPITAL_COMMUNITY)
Admission: RE | Admit: 2013-06-16 | Discharge: 2013-06-17 | Disposition: A | Discharge status: Home / Self Care | Payer: Medicare Other | Source: Ambulatory Visit | Attending: Orthopedic Surgery | Admitting: Orthopedic Surgery

## 2013-06-16 ENCOUNTER — Encounter (HOSPITAL_COMMUNITY): Payer: Self-pay | Admitting: *Deleted

## 2013-06-16 ENCOUNTER — Encounter (HOSPITAL_COMMUNITY): Payer: Medicare Other | Admitting: Anesthesiology

## 2013-06-16 DIAGNOSIS — S52532A Colles' fracture of left radius, initial encounter for closed fracture: Secondary | ICD-10-CM

## 2013-06-16 DIAGNOSIS — F17200 Nicotine dependence, unspecified, uncomplicated: Secondary | ICD-10-CM | POA: Insufficient documentation

## 2013-06-16 DIAGNOSIS — Z79899 Other long term (current) drug therapy: Secondary | ICD-10-CM | POA: Insufficient documentation

## 2013-06-16 DIAGNOSIS — W010XXA Fall on same level from slipping, tripping and stumbling without subsequent striking against object, initial encounter: Secondary | ICD-10-CM | POA: Insufficient documentation

## 2013-06-16 DIAGNOSIS — J449 Chronic obstructive pulmonary disease, unspecified: Secondary | ICD-10-CM | POA: Insufficient documentation

## 2013-06-16 DIAGNOSIS — J4489 Other specified chronic obstructive pulmonary disease: Secondary | ICD-10-CM | POA: Insufficient documentation

## 2013-06-16 DIAGNOSIS — G4733 Obstructive sleep apnea (adult) (pediatric): Secondary | ICD-10-CM | POA: Insufficient documentation

## 2013-06-16 DIAGNOSIS — S52509A Unspecified fracture of the lower end of unspecified radius, initial encounter for closed fracture: Principal | ICD-10-CM | POA: Insufficient documentation

## 2013-06-16 HISTORY — DX: Other complications of anesthesia, initial encounter: T88.59XA

## 2013-06-16 HISTORY — DX: Unspecified osteoarthritis, unspecified site: M19.90

## 2013-06-16 HISTORY — DX: Adverse effect of unspecified anesthetic, initial encounter: T41.45XA

## 2013-06-16 HISTORY — DX: Sleep apnea, unspecified: G47.30

## 2013-06-16 HISTORY — PX: ORIF WRIST FRACTURE: SHX2133

## 2013-06-16 LAB — CBC
Platelets: 356 10*3/uL (ref 150–400)
RBC: 4.17 MIL/uL (ref 3.87–5.11)
RDW: 12.6 % (ref 11.5–15.5)
WBC: 10.1 10*3/uL (ref 4.0–10.5)

## 2013-06-16 SURGERY — OPEN REDUCTION INTERNAL FIXATION (ORIF) WRIST FRACTURE
Anesthesia: Regional | Site: Wrist | Laterality: Right | Wound class: Clean

## 2013-06-16 MED ORDER — 0.9 % SODIUM CHLORIDE (POUR BTL) OPTIME
TOPICAL | Status: DC | PRN
  Administered 2013-06-16: 1000 mL

## 2013-06-16 MED ORDER — BUPIVACAINE-EPINEPHRINE PF 0.5-1:200000 % IJ SOLN
INTRAMUSCULAR | Status: DC | PRN
  Administered 2013-06-16: 30 mL

## 2013-06-16 MED ORDER — DIPHENHYDRAMINE HCL 25 MG PO CAPS: 25.0000 mg | ORAL_CAPSULE | Freq: Four times a day (QID) | ORAL | Status: DC | PRN

## 2013-06-16 MED ORDER — ADULT MULTIVITAMIN W/MINERALS CH
1.0000 | ORAL_TABLET | Freq: Every day | ORAL | Status: DC
  Administered 2013-06-16 – 2013-06-17 (×2): 1 via ORAL
  Filled 2013-06-16 (×2): qty 1

## 2013-06-16 MED ORDER — DOCUSATE SODIUM 100 MG PO CAPS
100.0000 mg | ORAL_CAPSULE | Freq: Two times a day (BID) | ORAL | Status: DC
  Administered 2013-06-16 (×2): 100 mg via ORAL
  Filled 2013-06-16 (×3): qty 1

## 2013-06-16 MED ORDER — LIDOCAINE HCL (CARDIAC) 20 MG/ML IV SOLN
INTRAVENOUS | Status: DC | PRN
  Administered 2013-06-16: 40 mg via INTRAVENOUS

## 2013-06-16 MED ORDER — FENTANYL CITRATE 0.05 MG/ML IJ SOLN
INTRAMUSCULAR | Status: AC
  Administered 2013-06-16: 50 ug via INTRAVENOUS
  Filled 2013-06-16: qty 2

## 2013-06-16 MED ORDER — OXYCODONE HCL 5 MG PO TABS
5.0000 mg | ORAL_TABLET | ORAL | Status: DC | PRN
  Administered 2013-06-17 (×3): 10 mg via ORAL
  Filled 2013-06-16 (×3): qty 2

## 2013-06-16 MED ORDER — BUPIVACAINE HCL (PF) 0.25 % IJ SOLN
INTRAMUSCULAR | Status: DC | PRN
  Administered 2013-06-16: 8 mL

## 2013-06-16 MED ORDER — PANTOPRAZOLE SODIUM 40 MG PO TBEC: 40.0000 mg | DELAYED_RELEASE_TABLET | Freq: Two times a day (BID) | ORAL | Status: DC | PRN

## 2013-06-16 MED ORDER — SODIUM CHLORIDE 0.45 % IV SOLN
INTRAVENOUS | Status: DC
  Administered 2013-06-16 – 2013-06-17 (×2): via INTRAVENOUS

## 2013-06-16 MED ORDER — LACTATED RINGERS IV SOLN
INTRAVENOUS | Status: DC | PRN
  Administered 2013-06-16: 11:00:00 via INTRAVENOUS

## 2013-06-16 MED ORDER — SODIUM CHLORIDE 0.45 % IV SOLN: INTRAVENOUS | Status: DC

## 2013-06-16 MED ORDER — CEFAZOLIN SODIUM-DEXTROSE 2-3 GM-% IV SOLR
INTRAVENOUS | Status: AC
  Administered 2013-06-16: 2 g via INTRAVENOUS
  Filled 2013-06-16: qty 50

## 2013-06-16 MED ORDER — MORPHINE SULFATE 2 MG/ML IJ SOLN: 1.0000 mg | INTRAMUSCULAR | Status: DC | PRN

## 2013-06-16 MED ORDER — ONDANSETRON HCL 4 MG/2ML IJ SOLN: 4.0000 mg | Freq: Four times a day (QID) | INTRAMUSCULAR | Status: DC | PRN

## 2013-06-16 MED ORDER — GLYCOPYRROLATE 0.2 MG/ML IJ SOLN
INTRAMUSCULAR | Status: DC | PRN
  Administered 2013-06-16: 0.2 mg via INTRAVENOUS

## 2013-06-16 MED ORDER — ONDANSETRON HCL 4 MG PO TABS: 4.0000 mg | ORAL_TABLET | Freq: Four times a day (QID) | ORAL | Status: DC | PRN

## 2013-06-16 MED ORDER — CHLORHEXIDINE GLUCONATE 4 % EX LIQD: 60.0000 mL | Freq: Once | CUTANEOUS | Status: DC

## 2013-06-16 MED ORDER — LACTATED RINGERS IV SOLN
INTRAVENOUS | Status: DC
  Administered 2013-06-16: 11:00:00 via INTRAVENOUS

## 2013-06-16 MED ORDER — FENTANYL CITRATE 0.05 MG/ML IJ SOLN
50.0000 ug | Freq: Once | INTRAMUSCULAR | Status: AC
  Administered 2013-06-16: 50 ug via INTRAVENOUS

## 2013-06-16 MED ORDER — CEFAZOLIN SODIUM 1-5 GM-% IV SOLN
1.0000 g | INTRAVENOUS | Status: AC
  Administered 2013-06-16: 1 g via INTRAVENOUS
  Filled 2013-06-16: qty 50

## 2013-06-16 MED ORDER — PHENYLEPHRINE HCL 10 MG/ML IJ SOLN
10.0000 mg | INTRAVENOUS | Status: DC | PRN
  Administered 2013-06-16: 10 ug/min via INTRAVENOUS

## 2013-06-16 MED ORDER — CEFAZOLIN SODIUM-DEXTROSE 2-3 GM-% IV SOLR
2.0000 g | INTRAVENOUS | Status: AC
  Administered 2013-06-16: 2 g via INTRAVENOUS

## 2013-06-16 MED ORDER — HYDROMORPHONE HCL PF 1 MG/ML IJ SOLN: 0.2500 mg | INTRAMUSCULAR | Status: DC | PRN

## 2013-06-16 MED ORDER — CEFAZOLIN SODIUM 1-5 GM-% IV SOLN
1.0000 g | Freq: Three times a day (TID) | INTRAVENOUS | Status: DC
  Administered 2013-06-16 – 2013-06-17 (×3): 1 g via INTRAVENOUS
  Filled 2013-06-16 (×5): qty 50

## 2013-06-16 MED ORDER — BUPIVACAINE HCL (PF) 0.25 % IJ SOLN
INTRAMUSCULAR | Status: AC
  Filled 2013-06-16: qty 30

## 2013-06-16 MED ORDER — PROPOFOL 10 MG/ML IV BOLUS
INTRAVENOUS | Status: DC | PRN
  Administered 2013-06-16: 120 mg via INTRAVENOUS

## 2013-06-16 SURGICAL SUPPLY — 57 items
BANDAGE ELASTIC 3 VELCRO ST LF (GAUZE/BANDAGES/DRESSINGS) ×2 IMPLANT
BANDAGE ELASTIC 4 VELCRO ST LF (GAUZE/BANDAGES/DRESSINGS) ×2 IMPLANT
BANDAGE GAUZE ELAST BULKY 4 IN (GAUZE/BANDAGES/DRESSINGS) ×1 IMPLANT
BIT DRILL 2.2 SS TIBIAL (BIT) ×1 IMPLANT
BLADE SURG ROTATE 9660 (MISCELLANEOUS) IMPLANT
BNDG CMPR 9X4 STRL LF SNTH (GAUZE/BANDAGES/DRESSINGS) ×1
BNDG ESMARK 4X9 LF (GAUZE/BANDAGES/DRESSINGS) ×2 IMPLANT
CANISTER SUCTION 2500CC (MISCELLANEOUS) ×1 IMPLANT
CLOTH BEACON ORANGE TIMEOUT ST (SAFETY) ×1 IMPLANT
CORDS BIPOLAR (ELECTRODE) ×2 IMPLANT
COVER SURGICAL LIGHT HANDLE (MISCELLANEOUS) ×2 IMPLANT
CUFF TOURNIQUET SINGLE 18IN (TOURNIQUET CUFF) ×2 IMPLANT
CUFF TOURNIQUET SINGLE 24IN (TOURNIQUET CUFF) IMPLANT
DRAIN TLS ROUND 10FR (DRAIN) IMPLANT
DRAPE OEC MINIVIEW 54X84 (DRAPES) ×1 IMPLANT
DRAPE SURG 17X23 STRL (DRAPES) ×2 IMPLANT
DRSG ADAPTIC 3X8 NADH LF (GAUZE/BANDAGES/DRESSINGS) ×1 IMPLANT
GAUZE XEROFORM 1X8 LF (GAUZE/BANDAGES/DRESSINGS) ×2 IMPLANT
GLOVE BIOGEL M STRL SZ7.5 (GLOVE) ×2 IMPLANT
GLOVE SS BIOGEL STRL SZ 8 (GLOVE) ×1 IMPLANT
GLOVE SUPERSENSE BIOGEL SZ 8 (GLOVE) ×1
GOWN PREVENTION PLUS XLARGE (GOWN DISPOSABLE) ×2 IMPLANT
GOWN STRL NON-REIN LRG LVL3 (GOWN DISPOSABLE) ×6 IMPLANT
GOWN STRL REIN XL XLG (GOWN DISPOSABLE) ×4 IMPLANT
KIT BASIN OR (CUSTOM PROCEDURE TRAY) ×2 IMPLANT
KIT ROOM TURNOVER OR (KITS) ×2 IMPLANT
LOOP VESSEL MAXI BLUE (MISCELLANEOUS) IMPLANT
MANIFOLD NEPTUNE II (INSTRUMENTS) ×1 IMPLANT
NEEDLE 22X1 1/2 (OR ONLY) (NEEDLE) ×1 IMPLANT
NS IRRIG 1000ML POUR BTL (IV SOLUTION) ×2 IMPLANT
PACK ORTHO EXTREMITY (CUSTOM PROCEDURE TRAY) ×2 IMPLANT
PAD ARMBOARD 7.5X6 YLW CONV (MISCELLANEOUS) ×4 IMPLANT
PAD CAST 3X4 CTTN HI CHSV (CAST SUPPLIES) ×1 IMPLANT
PAD CAST 4YDX4 CTTN HI CHSV (CAST SUPPLIES) ×1 IMPLANT
PADDING CAST COTTON 3X4 STRL (CAST SUPPLIES) ×2
PADDING CAST COTTON 4X4 STRL (CAST SUPPLIES) ×2
PEG LOCKING SMOOTH 2.2X18 (Peg) ×2 IMPLANT
PEG LOCKING SMOOTH 2.2X20 (Screw) ×4 IMPLANT
PLATE NARROW DVR RIGHT (Plate) ×1 IMPLANT
SCREW LOCK 12X2.7X 3 LD (Screw) IMPLANT
SCREW LOCK 14X2.7X 3 LD TPR (Screw) IMPLANT
SCREW LOCKING 2.7X12MM (Screw) ×2 IMPLANT
SCREW LOCKING 2.7X13MM (Screw) ×1 IMPLANT
SCREW LOCKING 2.7X14 (Screw) ×4 IMPLANT
SPLINT FIBERGLASS 3X12 (CAST SUPPLIES) ×1 IMPLANT
SPONGE GAUZE 4X4 12PLY (GAUZE/BANDAGES/DRESSINGS) ×2 IMPLANT
SPONGE LAP 4X18 X RAY DECT (DISPOSABLE) ×1 IMPLANT
SUT MNCRL AB 4-0 PS2 18 (SUTURE) ×2 IMPLANT
SUT PROLENE 3 0 PS 2 (SUTURE) IMPLANT
SUT VIC AB 3-0 FS2 27 (SUTURE) IMPLANT
SYR CONTROL 10ML LL (SYRINGE) ×1 IMPLANT
SYSTEM CHEST DRAIN TLS 7FR (DRAIN) ×1 IMPLANT
TOWEL OR 17X24 6PK STRL BLUE (TOWEL DISPOSABLE) ×2 IMPLANT
TOWEL OR 17X26 10 PK STRL BLUE (TOWEL DISPOSABLE) ×2 IMPLANT
TUBE CONNECTING 12X1/4 (SUCTIONS) ×2 IMPLANT
TUBE EVACUATION TLS (MISCELLANEOUS) ×2 IMPLANT
WATER STERILE IRR 1000ML POUR (IV SOLUTION) ×1 IMPLANT

## 2013-06-16 NOTE — Transfer of Care (Signed)
Immediate Anesthesia Transfer of Care Note  Patient: Linda Morse  Procedure(s) Performed: Procedure(s): OPEN REDUCTION INTERNAL FIXATION (ORIF) RIGHT WRIST FRACTURE  (Right)  Patient Location: PACU  Anesthesia Type:General  Level of Consciousness: awake, alert  and patient cooperative  Airway & Oxygen Therapy: Patient Spontanous Breathing and Patient connected to nasal cannula oxygen  Post-op Assessment: Report given to PACU RN, Post -op Vital signs reviewed and stable and Patient moving all extremities  Post vital signs: Reviewed and stable  Complications: No apparent anesthesia complications

## 2013-06-16 NOTE — Progress Notes (Signed)
Pt states she only wears CPAP at home for night use.  Dr. Sampson Goon wrote order for CPAP . He okays  pt to be assigned to 5N .

## 2013-06-16 NOTE — Anesthesia Procedure Notes (Addendum)
Anesthesia Regional Block:  Supraclavicular block  Pre-Anesthetic Checklist: ,, timeout performed, Correct Patient, Correct Site, Correct Laterality, Correct Procedure, Correct Position, site marked, Risks and benefits discussed, pre-op evaluation, post-op pain management  Laterality: Right  Prep: Maximum Sterile Barrier Precautions used and chloraprep       Needles:  Injection technique: Single-shot  Needle Type: Echogenic Stimulator Needle     Needle Length: 5cm 5 cm Needle Gauge: 22 and 22 G    Additional Needles:  Procedures: ultrasound guided (picture in chart) Supraclavicular block Narrative:  Start time: 06/16/2013 11:16 AM End time: 06/16/2013 11:25 AM Injection made incrementally with aspirations every 5 mL. Anesthesiologist: Fitzgerald,MD  Additional Notes: 2% Lidocaine skin wheel. Intercostobrachial block with 8cc of 0.25% Bupivicaine plain.  Supraclavicular block Procedure Name: LMA Insertion Date/Time: 06/16/2013 12:14 PM Performed by: Coralee Rud Pre-anesthesia Checklist: Patient identified, Emergency Drugs available, Suction available and Patient being monitored Patient Re-evaluated:Patient Re-evaluated prior to inductionOxygen Delivery Method: Circle system utilized Preoxygenation: Pre-oxygenation with 100% oxygen Intubation Type: IV induction Ventilation: Mask ventilation without difficulty LMA: LMA inserted LMA Size: 3.0 Number of attempts: 1 Placement Confirmation: positive ETCO2 Tube secured with: Tape Dental Injury: Teeth and Oropharynx as per pre-operative assessment

## 2013-06-16 NOTE — Anesthesia Preprocedure Evaluation (Addendum)
Anesthesia Evaluation  Patient identified by MRN, date of birth, ID band Patient awake    Reviewed: Allergy & Precautions, H&P , NPO status , Patient's Chart, lab work & pertinent test results  History of Anesthesia Complications (+) PROLONGED EMERGENCE  Airway Mallampati: II TM Distance: >3 FB Neck ROM: Full    Dental no notable dental hx. (+) Teeth Intact and Dental Advisory Given   Pulmonary sleep apnea and Continuous Positive Airway Pressure Ventilation , Current Smoker,  breath sounds clear to auscultation  Pulmonary exam normal       Cardiovascular negative cardio ROS  Rhythm:Regular Rate:Normal     Neuro/Psych Depression negative neurological ROS     GI/Hepatic negative GI ROS, Neg liver ROS,   Endo/Other  negative endocrine ROS  Renal/GU negative Renal ROS  negative genitourinary   Musculoskeletal   Abdominal   Peds  Hematology negative hematology ROS (+)   Anesthesia Other Findings   Reproductive/Obstetrics negative OB ROS                          Anesthesia Physical Anesthesia Plan  ASA: III  Anesthesia Plan: General and Regional   Post-op Pain Management:    Induction: Intravenous  Airway Management Planned: LMA  Additional Equipment:   Intra-op Plan:   Post-operative Plan: Extubation in OR  Informed Consent: I have reviewed the patients History and Physical, chart, labs and discussed the procedure including the risks, benefits and alternatives for the proposed anesthesia with the patient or authorized representative who has indicated his/her understanding and acceptance.   Dental advisory given  Plan Discussed with: CRNA  Anesthesia Plan Comments:         Anesthesia Quick Evaluation

## 2013-06-16 NOTE — Progress Notes (Signed)
Orthopedic Tech Progress Note Patient Details:  Linda Morse 1934/07/25 [id number] [place] sling applied to Right UE. Tolerated well.  Ortho Devices Type of Ortho Device: Arm sling Ortho Device/Splint Location: Right [place] sling Ortho Device/Splint Interventions: Application   Asia R Thompson 06/16/2013, 2:50 PM

## 2013-06-16 NOTE — Progress Notes (Signed)
Pt states she has not been wearing CPAP at home and does not wish to wear it here either.  Instructed Pt to notify RN should she change her mind and an RT would come set her up.

## 2013-06-16 NOTE — Anesthesia Postprocedure Evaluation (Signed)
  Anesthesia Post-op Note  Patient: Linda Morse  Procedure(s) Performed: Procedure(s): OPEN REDUCTION INTERNAL FIXATION (ORIF) RIGHT WRIST FRACTURE  (Right)  Patient Location: PACU  Anesthesia Type:GA combined with regional for post-op pain  Level of Consciousness: awake and alert   Airway and Oxygen Therapy: Patient Spontanous Breathing  Post-op Pain: none  Post-op Assessment: Post-op Vital signs reviewed, Patient's Cardiovascular Status Stable, Respiratory Function Stable, Patent Airway and No signs of Nausea or vomiting  Post-op Vital Signs: Reviewed and stable  Complications: No apparent anesthesia complications

## 2013-06-16 NOTE — Preoperative (Signed)
Beta Blockers   Reason not to administer Beta Blockers:Not Applicable 

## 2013-06-16 NOTE — Op Note (Signed)
See dictation #[id number] Dominica Severin MD

## 2013-06-16 NOTE — H&P (Signed)
Linda Morse is an 79 y.o. female.   Chief Complaint: right radius Fx HPI: presents for right radius ORIF .Marland KitchenPatient presents for evaluation and treatment of the of their upper extremity predicament. The patient denies neck back chest or of abdominal pain. The patient notes that they have no lower extremity problems. The patient from primarily complains of the upper extremity pain noted.  Past Medical History  Diagnosis Date  . Hypercholesterolemia   . Complication of anesthesia     takes 3- 4 days to wake up  . Arthritis   . Sleep apnea     Past Surgical History  Procedure Laterality Date  . Back surgery    . Knee surgery    . Eye surgery Bilateral     catarats  . Abdominal hysterectomy      History reviewed. No pertinent family history. Social History:  reports that she has been smoking.  She does not have any smokeless tobacco history on file. She reports that she does not drink alcohol or use illicit drugs.  Allergies: Not on File  Medications Prior to Admission  Medication Sig Dispense Refill  . Ascorbic Acid (VITAMIN C) 1000 MG tablet Take 1,000 mg by mouth daily.      Marland Kitchen aspirin EC 81 MG tablet Take 81 mg by mouth daily.      Marland Kitchen atorvastatin (LIPITOR) 40 MG tablet Take 40 mg by mouth daily.      . Cholecalciferol (D3-1000) 1000 UNITS capsule Take 1,000 Units by mouth daily.      . Cyanocobalamin (VITAMIN B-12) 1000 MCG SUBL Place 1,000 mcg under the tongue daily.      Marland Kitchen HYDROcodone-acetaminophen (NORCO/VICODIN) 5-325 MG per tablet Take 2 tablets by mouth every 6 (six) hours as needed for pain.      . magnesium citrate SOLN Take 1 Bottle by mouth once.      . Multiple Vitamins-Minerals (VISION FORMULA/LUTEIN) TABS Take 1 tablet by mouth daily.      Marland Kitchen pyridOXINE (VITAMIN B-6) 100 MG tablet Take 100 mg by mouth daily.      Marland Kitchen senna-docusate (SENOKOT-S) 8.6-50 MG per tablet Take 1 tablet by mouth as needed for constipation.      . sertraline (ZOLOFT) 50 MG tablet Take 50  mg by mouth daily.        Results for orders placed during the hospital encounter of 06/16/13 (from the past 48 hour(s))  CBC     Status: None   Collection Time    06/16/13 10:16 AM      Result Value Range   WBC 10.1  4.0 - 10.5 K/uL   RBC 4.17  3.87 - 5.11 MIL/uL   Hemoglobin 12.8  12.0 - 15.0 g/dL   HCT 16.1  09.6 - 04.5 %   MCV 88.7  78.0 - 100.0 fL   MCH 30.7  26.0 - 34.0 pg   MCHC 34.6  30.0 - 36.0 g/dL   RDW 40.9  81.1 - 91.4 %   Platelets 356  150 - 400 K/uL   No results found.  Review of Systems  HENT: Negative.   Respiratory: Negative.   Cardiovascular: Negative.   Gastrointestinal: Negative.   Genitourinary: Negative.   Skin: Negative.   Neurological: Negative.   Endo/Heme/Allergies: Negative.     Blood pressure 117/43, pulse 59, temperature 97.7 F (36.5 C), temperature source Oral, resp. rate 17, height 5' (1.524 m), weight 48.8 kg (107 lb 9.4 oz), SpO2 98.00%. Physical Exam right radius  fx presents for orif  She is notably comminuted and NVI .Marland KitchenThe patient is alert and oriented in no acute distress the patient complains of pain in the affected upper extremity.  The patient is noted to have a normal HEENT exam.  Lung fields show equal chest expansion and no shortness of breath  abdomen exam is nontender without distention.  Lower extremity examination does not show any fracture dislocation or blood clot symptoms.  Pelvis is stable neck and back are stable and nontender  Assessment/Plan .Marland KitchenWe are planning surgery for your upper extremity. The risk and benefits of surgery include risk of bleeding infection anesthesia damage to normal structures and failure of the surgery to accomplish its intended goals of relieving symptoms and restoring function with this in mind we'll going to proceed. I have specifically discussed with the patient the pre-and postoperative regime and the does and don'ts and risk and benefits in great detail. Risk and benefits of surgery also  include risk of dystrophy chronic nerve pain failure of the healing process to go onto completion and other inherent risks of surgery The relavent the pathophysiology of the disease/injury process, as well as the alternatives for treatment and postoperative course of action has been discussed in great detail with the patient who desires to proceed.  We will do everything in our power to help you (the patient) restore function to the upper extremity. Is a pleasure to see this patient today.   Sheenah Dimitroff III,Rhyleigh Grassel M 06/16/2013, 11:54 AM

## 2013-06-16 NOTE — Evaluation (Addendum)
Occupational Therapy Evaluation Patient Details Name: Linda Morse MRN: [id number] DOB: Jun 02, 1934 Today's Date: 06/16/2013 Time: 4696-2952 OT Time Calculation (min): 34 min  OT Assessment / Plan / Recommendation History of present illness OPEN REDUCTION INTERNAL FIXATION (ORIF) RIGHT WRIST FRACTURE  (Right)   Clinical Impression   Pt presents with problem list below. Pt still numb from nerve block. Pt independent with ADLs, PTA (used cane). Pt will benefit from acute OT to increase independence prior to d/c. Pt has daughters she can stay with.     OT Assessment  Patient needs continued OT Services    Follow Up Recommendations  No OT followup;Supervision/Assistance - 24 hour    Barriers to Discharge      Equipment Recommendations  None recommended by OT    Recommendations for Other Services    Frequency  Min 2X/week    Precautions / Restrictions Precautions Precautions: Fall Required Braces or Orthoses: Sling (mission sling for RUE) Restrictions Weight Bearing Restrictions: Yes RUE Weight Bearing: Non weight bearing   Pertinent Vitals/Pain No pain reported-nerve block has not worn off.     ADL  Eating/Feeding: Set up Where Assessed - Eating/Feeding: Chair Grooming: Min guard;Wash/dry hands Where Assessed - Grooming: Unsupported standing Upper Body Bathing: Minimal assistance Where Assessed - Upper Body Bathing: Supported sitting Lower Body Bathing: Moderate assistance Where Assessed - Lower Body Bathing: Supported sit to stand Upper Body Dressing: Minimal assistance Where Assessed - Upper Body Dressing: Supported sitting Lower Body Dressing: Moderate assistance Where Assessed - Lower Body Dressing: Supported sit to stand Toilet Transfer: Hydrographic surveyor Method: Sit to Barista: Raised toilet seat with arms (or 3-in-1 over toilet) Toileting - Clothing Manipulation and Hygiene: Moderate assistance Where Assessed - Toileting  Clothing Manipulation and Hygiene: Sit to stand from 3-in-1 or toilet Tub/Shower Transfer Method: Not assessed Equipment Used: Gait belt Transfers/Ambulation Related to ADLs: Min A-hand held ADL Comments: Pt ambulated to bathroom and required assistance for clothing management and OT also assisted with hygiene as pt had feces on leg/toilet. Educated pt on benefit of elevating RUE and also told her to ice it all night. Demonstrated PROM of Rt digits by using Left hand to gently move right digits.  Educated on no pushing/pulling/lifting with RUE.     OT Diagnosis: Acute pain  OT Problem List: Impaired balance (sitting and/or standing);Decreased range of motion;Decreased knowledge of use of DME or AE;Decreased knowledge of precautions;Pain;Impaired UE functional use;Increased edema;Impaired sensation;Decreased coordination OT Treatment Interventions: Self-care/ADL training;Therapeutic exercise;DME and/or AE instruction;Therapeutic activities;Patient/family education;Balance training   OT Goals(Current goals can be found in the care plan section) Acute Rehab OT Goals Patient Stated Goal: go home OT Goal Formulation: With patient Time For Goal Achievement: 06/23/13 Potential to Achieve Goals: Good ADL Goals Pt Will Perform Lower Body Bathing: with set-up;with supervision;sit to/from stand Pt Will Perform Upper Body Dressing: with set-up;with supervision;sitting Pt Will Perform Lower Body Dressing: with set-up;with supervision;sit to/from stand Pt Will Transfer to Toilet: with modified independence;ambulating (3 in 1 over commode) Pt Will Perform Toileting - Clothing Manipulation and hygiene: with supervision;sit to/from stand Pt Will Perform Tub/Shower Transfer: ambulating;Tub transfer;with supervision;tub bench Additional ADL Goal #1: Pt will independently verbalize and demonstrate 3/3 edema management techniques.   Visit Information  Last OT Received On: 06/16/13 Assistance Needed:  +1 History of Present Illness: OPEN REDUCTION INTERNAL FIXATION (ORIF) RIGHT WRIST FRACTURE  (Right)       Prior Functioning     Home Living Family/patient  expects to be discharged to:: Private residence Living Arrangements: Alone Available Help at Discharge: Family;Available 24 hours/day (thinks one daughter will stay a few nights/can stay with them if she needs too-One daughter is retired) Type of Home: House Home Access: Stairs to enter Secretary/administrator of Steps: 1  Entrance Stairs-Rails: None Home Layout: One level Home Equipment: Cane - single point;Bedside commode;Tub bench Prior Function Level of Independence: Independent with assistive device(s) Communication Communication: No difficulties Dominant Hand: Right         Vision/Perception     Cognition  Cognition Arousal/Alertness: Awake/alert Behavior During Therapy: WFL for tasks assessed/performed Overall Cognitive Status: Within Functional Limits for tasks assessed    Extremity/Trunk Assessment Upper Extremity Assessment Upper Extremity Assessment: RUE deficits/detail RUE: Unable to fully assess due to immobilization RUE Sensation: decreased light touch Lower Extremity Assessment Lower Extremity Assessment: Defer to PT evaluation     Mobility Bed Mobility Bed Mobility: Supine to Sit;Sitting - Scoot to Edge of Bed Supine to Sit: 4: Min assist Sitting - Scoot to Edge of Bed: 3: Mod assist Details for Bed Mobility Assistance: A to elevate trunk and Mod A to scoot. OT helped position RUE during bed mobility as she was still numb. Transfers Transfers: Sit to Stand;Stand to Sit Sit to Stand: 4: Min assist;4: Min guard;From bed;From chair/3-in-1 Stand to Sit: 4: Min guard;To chair/3-in-1 Details for Transfer Assistance: Min A to initially stand from bed. Min guard for sit <> stand from 3 in 1 and to sit on recliner chair.      Exercise     Balance     End of Session OT - End of Session Equipment  Utilized During Treatment: Gait belt Activity Tolerance: Patient tolerated treatment well Patient left: in chair;with call bell/phone within reach Nurse Communication: Other (comment) (BM and up sitting in chair; ice UE all night)  GO Functional Assessment Tool Used: clinical judgment Functional Limitation: Self care Self Care Current Status (Z6109): At least 20 percent but less than 40 percent impaired, limited or restricted Self Care Goal Status (U0454): At least 1 percent but less than 20 percent impaired, limited or restricted   Earlie Raveling OTR/L [phone] 06/16/2013, 5:35 PM

## 2013-06-17 MED ORDER — OXYCODONE HCL 5 MG PO TABS: 5.0000 mg | ORAL_TABLET | ORAL | Status: DC | PRN

## 2013-06-17 NOTE — Progress Notes (Addendum)
Occupational Therapy Treatment Patient Details Name: Linda Morse MRN: [id number] DOB: 1933/09/08 Today's Date: 06/17/2013 Time: 1308-6578 OT Time Calculation (min): 32 min  OT Assessment / Plan / Recommendation  History of present illness Pt tripped over a footstool and sustained R wrist fx.  She underwent ORIF yesterday, 06-16-13.   OT comments  Practiced LB dressing and toilet transfer. Educated on precautions for RUE as well as to move digits, elevate RUE, and to ice RUE. OT also educated and demonstrated retrograde massage as digits were swollen.   Follow Up Recommendations  Home health OT;Supervision/Assistance - 24 hour    Barriers to Discharge       Equipment Recommendations  None recommended by OT    Recommendations for Other Services    Frequency Min 2X/week   Progress towards OT Goals Progress towards OT goals: Progressing toward goals  Plan Discharge plan needs to be updated    Precautions / Restrictions Precautions Precautions: Fall Required Braces or Orthoses: Sling Restrictions Weight Bearing Restrictions: Yes RUE Weight Bearing: Non weight bearing Other Position/Activity Restrictions: WB through elbow only   Pertinent Vitals/Pain Pain 3/10. Nurse in room and aware. Elevated RUE and applied ice.    ADL  Lower Body Dressing: Minimal assistance Where Assessed - Lower Body Dressing: Supported sit to stand Toilet Transfer: Hydrographic surveyor Method: Sit to Barista: Regular height toilet (daughter assisted at RUE to simulate counter) Equipment Used: Other (comment) (platform walker) Transfers/Ambulation Related to ADLs: Cues and min A for walker. Min guard/supervision for transfers. Did assist with positioning of RUE during at least one transfer. ADL Comments: Pt donned pants and underwear. Educated to stand in front of bed/chair with walker in front for safety. Educated to elevate RUE, move fingers, and educated/performed  retrograde massage. Educated pt and family on precautions of RUE-no lifting, pulling, pushing and no weightbearing through RUE-only elbow. Pt did not feel need to practice tub transfer with tub bench and daughter said she has been doing this at home okay.  Educated on use of elastic shoe laces for shoes.  Practiced simulated regular height toilet transfer and daughter assisted RUE when sitting to simulate counter at home-pt has 3 in 1 at home she can use. Practiced regular height commode since pt thought that is what she would use at home-OT thinks the 3 in 1 will be easier for transfers.    OT Diagnosis:    OT Problem List:   OT Treatment Interventions:     OT Goals(current goals can now be found in the care plan section) Acute Rehab OT Goals Patient Stated Goal: home OT Goal Formulation: With patient Time For Goal Achievement: 06/23/13 Potential to Achieve Goals: Good ADL Goals Pt Will Perform Lower Body Bathing: with set-up;with supervision;sit to/from stand Pt Will Perform Upper Body Dressing: with set-up;with supervision;sitting Pt Will Perform Lower Body Dressing: with set-up;with supervision;sit to/from stand Pt Will Transfer to Toilet: with modified independence;ambulating (3 in 1 over commode) Pt Will Perform Toileting - Clothing Manipulation and hygiene: with supervision;sit to/from stand Pt Will Perform Tub/Shower Transfer: ambulating;Tub transfer;with supervision;tub bench Additional ADL Goal #1: Pt will independently verbalize and demonstrate 3/3 edema management techniques.   Visit Information  Last OT Received On: 06/17/13 Assistance Needed: +1 History of Present Illness: Pt tripped over a footstool and sustained R wrist fx.  She underwent ORIF yesterday, 06-16-13.    Subjective Data      Prior Functioning       Cognition  Cognition Arousal/Alertness: Awake/alert Behavior During Therapy: WFL for tasks assessed/performed Overall Cognitive Status: Within Functional  Limits for tasks assessed    Mobility  Bed Mobility Bed Mobility: Not assessed Transfers Transfers: Sit to Stand;Stand to Sit Sit to Stand: 4: Min guard;5: Supervision;From chair/3-in-1;From toilet Stand to Sit: 4: Min guard;To chair/3-in-1;To toilet;5: Supervision;4: Min assist Details for Transfer Assistance: cues for hand placement and technique. Min guard for sit <> stand from simulated regular height commode and Supervision/Min guard for transfers from chair.  Did assist with positioning of RUE during at least one transfer.     Exercises      Balance     End of Session OT - End of Session Equipment Utilized During Treatment: Other (comment) (platform walker) Activity Tolerance: Patient tolerated treatment well Patient left: in chair;with family/visitor present  GO Functional Assessment Tool Used: clinical judgment Functional Limitation: Self care Self Care Current Status (Z6109): At least 20 percent but less than 40 percent impaired, limited or restricted Self Care Goal Status (U0454): At least 1 percent but less than 20 percent impaired, limited or restricted Self Care Discharge Status [phone]): At least 20 percent but less than 40 percent impaired, limited or restricted   Earlie Raveling OTR/L [phone] 06/17/2013, 4:23 PM

## 2013-06-17 NOTE — Progress Notes (Signed)
Utilization Review Completed.Linda Morse T10/08/2013  

## 2013-06-17 NOTE — Discharge Summary (Signed)
Physician Discharge Summary  Patient ID: Linda Morse MRN: [id number] DOB/AGE: Jul 09, 1934 79 y.o.  Admit date: 06/16/2013 Discharge date: 06/17/2013  Admission Diagnoses: Right distal radius fracture Patient Active Problem List   Diagnosis Date Noted  . TOBACCO ABUSE 09/06/2007  . OBSTRUCTIVE SLEEP APNEA 09/06/2007  . COPD 09/06/2007    Discharge Diagnoses:  status post open reduction internal fixation right distal radius fracture  Patient Active Problem List   Diagnosis Date Noted  . TOBACCO ABUSE 09/06/2007  . OBSTRUCTIVE SLEEP APNEA 09/06/2007  . COPD 09/06/2007     Discharged Condition: Improved   Hospital Course: patient is a pleasant 79 year old female who presents with a displaced right distal radius fracture. We discussed with her the nature of the upper extremity predicament and have recommended surgical intervention for fixation of the fracture. She is in agreement. Preoperative workup showed she was stable in regards to labs, chest x-ray and EKG . Decision was made proceed with surgical intervention. Patient underwent open reduction internal fixation about the right wrist without complicating features, please see operative report for full details. On postoperative day #1 the patient was awake, alert and oriented. She was up in a chair eating lunch without difficulties. She was tolerating a regular diet, she was voiding without difficulties. She was having no significant pain. Is tolerating her medication regime nicely. Discussed with the patient at length discharge instructions and have recommended home health care per therapies recommendations as her gait is somewhat unstable. I have given her a prescription for a platform walker to utilized and will have placed care management consult for home health care in the form of physical therapy gait training .  Consults: none    Treatments: See operative report for full details   Discharge Exam: Blood pressure 135/58,  pulse 63, temperature 98.7 F (37.1 C), temperature source Oral, resp. rate 18, height 5' (1.524 m), weight 48.8 kg (107 lb 9.4 oz), SpO2 93.00%. Marland Kitchen.The patient is alert and oriented in no acute distress the patient complains of pain in the affected upper extremity.  The patient is noted to have a normal HEENT exam.  Lung fields show equal chest expansion and no shortness of breath  abdomen exam is nontender without distention.  Lower extremity examination does not show any fracture dislocation or blood clot symptoms.  Pelvis is stable neck and back are stable and nontender  examination the right upper extremity shows that her splint is intact, the drain is removed without difficulties. She has excellent digital range of motion but this is somewhat limited given the degree of underlying arthritis of the digits. She has no significant swelling she has no signs of infection. Neurovascularly she is intact.  Disposition: 01-Home or Self Care  Discharge Orders   Future Orders Complete By Expires   Call MD / Call 911  As directed    Comments:     If you experience chest pain or shortness of breath, CALL 911 and be transported to the hospital emergency room.  If you develope a fever above 101 F, pus (white drainage) or increased drainage or redness at the wound, or calf pain, call your surgeon's office.   Constipation Prevention  As directed    Comments:     Drink plenty of fluids.  Prune juice may be helpful.  You may use a stool softener, such as Colace (over the counter) 100 mg twice a day.  Use MiraLax (over the counter) for constipation as needed.   Diet - low  sodium heart healthy  As directed    Discharge instructions  As directed    Comments:     Marland KitchenMarland KitchenWe recommend that you to take vitamin C 1000 mg a day to promote healing we also recommend that if you require her pain medicine that he take a stool softener to prevent constipation as most pain medicines will have constipation side effects. We  recommend either Peri-Colace or Senokot and recommend that you also consider adding MiraLAX to prevent the constipation affects from pain medicine if you are required to use them. These medicines are over the counter and maybe purchased at a local pharmacy.  Marland Kitchen.We recommend that you to take vitamin C 1000 mg a day to promote healing we also recommend that if you require her pain medicine that he take a stool softener to prevent constipation as most pain medicines will have constipation side effects. We recommend either Peri-Colace or Senokot and recommend that you also consider adding MiraLAX to prevent the constipation affects from pain medicine if you are required to use them. These medicines are over the counter and maybe purchased at a local pharmacy.   Increase activity slowly as tolerated  As directed        Medication List    STOP taking these medications       HYDROcodone-acetaminophen 5-325 MG per tablet  Commonly known as:  NORCO/VICODIN      TAKE these medications       aspirin EC 81 MG tablet  Take 81 mg by mouth daily.     atorvastatin 40 MG tablet  Commonly known as:  LIPITOR  Take 40 mg by mouth daily.     D3-1000 1000 UNITS capsule  Generic drug:  Cholecalciferol  Take 1,000 Units by mouth daily.     magnesium citrate Soln  Take 1 Bottle by mouth once.     oxyCODONE 5 MG immediate release tablet  Commonly known as:  Oxy IR/ROXICODONE  Take 1 tablet (5 mg total) by mouth every 3 (three) hours as needed.     pyridOXINE 100 MG tablet  Commonly known as:  VITAMIN B-6  Take 100 mg by mouth daily.     senna-docusate 8.6-50 MG per tablet  Commonly known as:  Senokot-S  Take 1 tablet by mouth as needed for constipation.     sertraline 50 MG tablet  Commonly known as:  ZOLOFT  Take 50 mg by mouth daily.     VISION FORMULA/LUTEIN Tabs  Take 1 tablet by mouth daily.     vitamin C 1000 MG tablet  Take 1,000 mg by mouth daily.      ASK your doctor about these  medications       Vitamin B-12 1000 MCG Subl  Place 1,000 mcg under the tongue daily.           Follow-up Information   Follow up with Karen Chafe, MD. Schedule an appointment as soon as possible for a visit in 2 weeks.   Specialty:  Orthopedic Surgery   Contact information:   [address] [place] [address] [phone]       Signed: Sheran Lawless 06/17/2013, 12:23 PM

## 2013-06-17 NOTE — Progress Notes (Signed)
Red top tubing changed this am.  Output of 10 mL.

## 2013-06-17 NOTE — Progress Notes (Signed)
   CARE MANAGEMENT NOTE 06/17/2013  Patient:  Linda Morse, Linda Morse   Account Number:  [id number]  Date Initiated:  06/17/2013  Documentation initiated by:  [facility]  Subjective/Objective Assessment:   status post open reduction internal fixation right distal radius fracture     Action/Plan:   HH PT recommended   Anticipated DC Date:  06/17/2013   Anticipated DC Plan:  HOME W HOME HEALTH SERVICES      DC Planning Services  CM consult      [facility] Choice  HOME HEALTH   Choice offered to / List presented to:  C-1 Patient   DME arranged  WALKER - PLATFORM      DME agency  [facility]     HH arranged  HH-2 PT  HH-3 OT      [facility] agency  [facility]   Status of service:  Completed, signed off Medicare Important Message given?   (If response is "NO", the following Medicare IM given date fields will be blank) Date Medicare IM given:   Date Additional Medicare IM given:    Discharge Disposition:  HOME W HOME HEALTH SERVICES  Per UR Regulation:    If discussed at Long Length of Stay Meetings, dates discussed:    Comments:  06/17/13 13:15 CM met with pt in room to offer choice for HHPT/OT.  Pt states she would like to have [facility].  Address and phone contact numbers were verified.  Referral was faxed to [facility] for HHPT/OT.  Platform walker to be delivered to room prior to discharge.  No other CM needs were communicated.  Freddy Jaksch, BSN, CM [phone].

## 2013-06-17 NOTE — Evaluation (Signed)
Physical Therapy Evaluation Patient Details Name: Linda Morse MRN: [id number] DOB: Oct 14, 1933 Today's Date: 06/17/2013 Time: 4098-1191 PT Time Calculation (min): 28 min  PT Assessment / Plan / Recommendation History of Present Illness  Pt tripped over a footstool and sustained R wrist fx.  She underwent ORIF yesterday, 06-16-13.  Clinical Impression  Patient is s/p R wrist ORIF surgery resulting in functional limitations due to the deficits listed below (see PT Problem List). Pt used a SPC in her right hand prior to admission.  Pt is now having to ambulate with SPC in L hand increasing her balance difficulties. Patient will benefit from skilled PT to increase their independence and safety with mobility to allow discharge to the venue listed below. HHPT indicated to assist with balance.  Pt with probable d/c home today.  If pt remains as an inpt, acute PT to continue Rx.      PT Assessment  Patient needs continued PT services    Follow Up Recommendations  Home health PT    Does the patient have the potential to tolerate intense rehabilitation      Barriers to Discharge        Equipment Recommendations  None recommended by PT    Recommendations for Other Services     Frequency Min 5X/week    Precautions / Restrictions Precautions Precautions: Fall Required Braces or Orthoses: Sling Restrictions RUE Weight Bearing: Non weight bearing   Pertinent Vitals/Pain 5/10      Mobility  Transfers Sit to Stand: 4: Min guard;With armrests;From chair/3-in-1 Stand to Sit: 4: Min guard;With upper extremity assist;To chair/3-in-1;With armrests Ambulation/Gait Ambulation/Gait Assistance: 4: Min guard Ambulation Distance (Feet): 120 Feet Assistive device: Straight cane Gait Pattern: Decreased stride length Gait velocity: very slow General Gait Details: pt fearful of falling Stairs: Yes Stairs Assistance: 4: Min guard Stair Management Technique: One rail Left;Forwards Number  of Stairs: 3    Exercises     PT Diagnosis: Difficulty walking;Acute pain  PT Problem List: Decreased activity tolerance;Decreased balance;Decreased mobility;Pain;Decreased safety awareness PT Treatment Interventions: DME instruction;Gait training;Stair training;Functional mobility training;Therapeutic activities;Patient/family education     PT Goals(Current goals can be found in the care plan section) Acute Rehab PT Goals Patient Stated Goal: home PT Goal Formulation: With patient Time For Goal Achievement: 06/24/13 Potential to Achieve Goals: Good  Visit Information  Last PT Received On: 06/17/13 Assistance Needed: +1 History of Present Illness: Pt tripped over a footstool and sustained R wrist fx.  She underwent ORIF yesterday, 06-16-13.       Prior Functioning  Home Living Family/patient expects to be discharged to:: Private residence Living Arrangements: Alone Available Help at Discharge: Family;Available 24 hours/day Type of Home: House Home Access: Stairs to enter [employer] of Steps: 1 Entrance Stairs-Rails: Left Home Equipment: Cane - single point;Bedside commode;Tub bench Prior Function Level of Independence: Independent with assistive device(s) Communication Communication: No difficulties Dominant Hand: Right    Cognition  Cognition Arousal/Alertness: Awake/alert Behavior During Therapy: WFL for tasks assessed/performed Overall Cognitive Status: Within Functional Limits for tasks assessed    Extremity/Trunk Assessment     Balance    End of Session PT - End of Session Equipment Utilized During Treatment: Gait belt Activity Tolerance: Patient tolerated treatment well Patient left: in chair;with call bell/phone within reach Nurse Communication: Mobility status  GP Functional Assessment Tool Used: clinical judgement Functional Limitation: Mobility: Walking and moving around Mobility: Walking and Moving Around Current Status (Y7829): At least 1  percent but less than  20 percent impaired, limited or restricted Mobility: Walking and Moving Around Goal Status [phone]): 0 percent impaired, limited or restricted   Ilda Foil 06/17/2013, 11:57 AM  Aida Raider, PT  Office # [phone] Pager [phone]

## 2013-06-17 NOTE — Progress Notes (Signed)
Physical Therapy Treatment Patient Details Name: Linda Morse MRN: [id number] DOB: Sep 10, 1933 Today's Date: 06/17/2013 Time: 4098-1191 PT Time Calculation (min): 25 min  PT Assessment / Plan / Recommendation  History of Present Illness Pt tripped over a footstool and sustained R wrist fx.  She underwent ORIF yesterday, 06-16-13.   PT Comments   Platform RW ordered by PA for increased stability with gait.  Gait training with R platform RW completed by PT.  Follow Up Recommendations  Home health PT     Does the patient have the potential to tolerate intense rehabilitation     Barriers to Discharge        Equipment Recommendations  Rolling walker with 5" wheels (R platform)    Recommendations for Other Services    Frequency Min 5X/week   Progress towards PT Goals Progress towards PT goals: Progressing toward goals  Plan Current plan remains appropriate    Precautions / Restrictions Precautions Precautions: Fall Required Braces or Orthoses: Sling Restrictions RUE Weight Bearing: Non weight bearing   Pertinent Vitals/Pain 2/10    Mobility  Transfers Sit to Stand: 4: Min guard;With armrests;With upper extremity assist;From chair/3-in-1 Stand to Sit: 4: Min guard;To chair/3-in-1;With upper extremity assist;With armrests Details for Transfer Assistance: verbal cues for hand placement Ambulation/Gait Ambulation/Gait Assistance: 4: Min guard Ambulation Distance (Feet): 60 Feet Assistive device: Right platform walker Gait Pattern: Step-through pattern;Decreased stride length Gait velocity: slow General Gait Details: pt fearful of falling Stairs: Yes Stairs Assistance: 4: Min guard Stair Management Technique: One rail Left;Forwards Number of Stairs: 3    Exercises     PT Diagnosis: Difficulty walking;Acute pain  PT Problem List: Decreased activity tolerance;Decreased balance;Decreased mobility;Pain;Decreased safety awareness PT Treatment Interventions: DME  instruction;Gait training;Stair training;Functional mobility training;Therapeutic activities;Patient/family education   PT Goals (current goals can now be found in the care plan section) Acute Rehab PT Goals Patient Stated Goal: home PT Goal Formulation: With patient Time For Goal Achievement: 06/24/13 Potential to Achieve Goals: Good  Visit Information  Last PT Received On: 06/17/13 Assistance Needed: +1 History of Present Illness: Pt tripped over a footstool and sustained R wrist fx.  She underwent ORIF yesterday, 06-16-13.    Subjective Data  Patient Stated Goal: home   Cognition  Cognition Arousal/Alertness: Awake/alert Behavior During Therapy: WFL for tasks assessed/performed Overall Cognitive Status: Within Functional Limits for tasks assessed    Balance     End of Session PT - End of Session Equipment Utilized During Treatment: Gait belt Activity Tolerance: Patient tolerated treatment well Patient left: in chair;with call bell/phone within reach;with family/visitor present Nurse Communication: Mobility status   GP Functional Assessment Tool Used: clinical judgement Functional Limitation: Mobility: Walking and moving around Mobility: Walking and Moving Around Current Status (Y7829): At least 1 percent but less than 20 percent impaired, limited or restricted Mobility: Walking and Moving Around Goal Status [phone]): 0 percent impaired, limited or restricted   Ilda Foil 06/17/2013, 2:15 PM  Aida Raider, PT  Office # [phone] Pager [phone]

## 2013-06-17 NOTE — Op Note (Signed)
Linda Morse, Linda Morse           ACCOUNT NO.:  [id number]  MEDICAL RECORD NO.:  [id number]  LOCATION:  5N15C                        FACILITY:  [facility]  PHYSICIAN:  Dionne Ano. Jenesys Casseus, M.D.DATE OF BIRTH:  Dec 11, 1933  DATE OF PROCEDURE: DATE OF DISCHARGE:                              OPERATIVE REPORT   PREOPERATIVE DIAGNOSES:  Comminuted complex greater than 5-part intra- articular distal radius fracture, right upper extremity, with progressive angulatory collapse.  POSTOPERATIVE DIAGNOSES:  Comminuted complex greater than 5-part intra- articular distal radius fracture, right upper extremity, with progressive angulatory collapse.  PROCEDURE: 1. Open reduction and internal fixation, distal radius fracture,     greater than 5-part comminuted complex distal radius fracture. 2. Sliding brachioradialis tenotomy. 3. Stress radiography. 4. Closed treatment ulnar styloid fracture, which was stable to     evaluation under anesthesia.  SURGEON:  Dionne Ano. Amanda Pea, M.D.  ASSISTANT:  Karie Chimera, P.A.-C.  COMPLICATIONS:  None.  DRAINS:  1  ANESTHESIA:  General with preoperative block.  TOURNIQUET TIME:  Less than 30 minutes.  INDICATIONS:  The patient is a pleasant female who presents with the above-mentioned diagnoses.  I have counseled in regard to risks and benefits of surgery including risk of infection, bleeding, anesthesia, damage to normal structures, and failure of surgery to accomplish its intended goals of relieving symptoms and restoring function.  With this in mind, she desired to proceed.  All questions had been encouraged and answered preoperatively.  OPERATIVE PROCEDURE:  The patient was seen by myself and Anesthesia, taken to operative suite, underwent smooth induction of general anesthesia.  This was an LMA general.  She was laid supine and fully padded, prepped, and draped in a sterile fashion, Betadine scrub and paint about the right upper extremity.   Following this, time-out was called and landmarks were made.  Pre and postop check was complete. Volar radial incision was made.  Dissection was carried down, the FCR tendon sheath was incised palmarly and dorsally.  Following this, the FCR was retracted out of harm's way.  The pronator was incised in an L- shaped fashion, and once this was done, I then accessed the fracture. Reduction was performed with sliding brachioradialis tenotomy and associated soft tissue as well as bony management.  Once this was done, I put a narrow 3-hole DVR plate on.  I was able to achieve adequate radial height, inclination, and volar tilt to my satisfaction.  Plate was applied in standard AO technique.  Following this, I irrigated copiously, closed the pronator, verified correct reduction under x-ray. We were quite pleased.  Once this was done, I then performed evaluation under anesthesia, the distal radioulnar joint looked well.  I chose for closed treatment of the ulnar styloid fracture as I felt the TFC was secure.  There was no gross instability.  Following this, tourniquet was deflated.  Hemostasis secured.  Following this, drain was placed, #7 TLS and the patient then underwent further irrigation followed by closure of the wound with Prolene.  She was placed in a thumb spica splint.  She will be admitted overnight for IV antibiotics, general postop observation.  Should any problems occur, she will notify me, otherwise, we will proceed according to our  standard postop DVR algorithm.  These notes have been discussed.  All questions have been encouraged and answered.  This was an uncomplicated reconstruction, and we look forward to participate in her postop management.     Dionne Ano. Amanda Pea, M.D.     [facility]  D:  06/16/2013  T:  06/16/2013  Job:  [id number]

## 2013-06-20 ENCOUNTER — Encounter (HOSPITAL_COMMUNITY): Payer: Self-pay | Admitting: Orthopedic Surgery

## 2014-06-27 ENCOUNTER — Other Ambulatory Visit: Payer: Self-pay | Admitting: Family Medicine

## 2014-06-27 DIAGNOSIS — Z720 Tobacco use: Secondary | ICD-10-CM

## 2014-07-02 ENCOUNTER — Ambulatory Visit
Admission: RE | Admit: 2014-07-02 | Discharge: 2014-07-02 | Disposition: A | Discharge status: Home / Self Care | Payer: Medicare Other | Source: Ambulatory Visit | Attending: Family Medicine | Admitting: Family Medicine

## 2014-07-02 DIAGNOSIS — Z720 Tobacco use: Secondary | ICD-10-CM

## 2014-11-15 ENCOUNTER — Ambulatory Visit
Admission: RE | Admit: 2014-11-15 | Discharge: 2014-11-15 | Disposition: A | Discharge status: Home / Self Care | Payer: Medicare Other | Source: Ambulatory Visit | Attending: Physician Assistant | Admitting: Physician Assistant

## 2014-11-15 ENCOUNTER — Other Ambulatory Visit: Payer: Self-pay | Admitting: Physician Assistant

## 2014-11-15 DIAGNOSIS — W19XXXA Unspecified fall, initial encounter: Secondary | ICD-10-CM

## 2014-11-15 DIAGNOSIS — R0781 Pleurodynia: Secondary | ICD-10-CM

## 2015-01-03 ENCOUNTER — Ambulatory Visit: Payer: Medicare Other

## 2015-01-22 ENCOUNTER — Ambulatory Visit: Payer: Medicare Other | Attending: Family Medicine

## 2015-01-22 DIAGNOSIS — R296 Repeated falls: Secondary | ICD-10-CM | POA: Diagnosis not present

## 2015-01-22 DIAGNOSIS — R269 Unspecified abnormalities of gait and mobility: Secondary | ICD-10-CM | POA: Diagnosis not present

## 2015-01-22 NOTE — Therapy (Signed)
[facility] [address] [place], [place], [address] Phone: [phone]   Fax:  [phone]  Physical Therapy Evaluation  Patient Details  Name: Linda Morse MRN: [id number] Date of Birth: 1933-10-12 Referring Provider:  Gavin Pound, MD  Encounter Date: 01/22/2015      PT End of Session - 01/22/15 1441    Visit Number 1   Number of Visits 17   Date for PT Re-Evaluation 03/23/15   Authorization Type G-code every 10th visit.   PT Start Time 1152   PT Stop Time 1237   PT Time Calculation (min) 45 min   Equipment Utilized During Treatment Gait belt   Activity Tolerance Patient tolerated treatment well   Behavior During Therapy WFL for tasks assessed/performed      Past Medical History  Diagnosis Date  . Hypercholesterolemia   . Complication of anesthesia     takes 3- 4 days to wake up  . Arthritis   . Sleep apnea     Past Surgical History  Procedure Laterality Date  . Back surgery    . Knee surgery    . Eye surgery Bilateral     catarats  . Abdominal hysterectomy    . Orif wrist fracture Right 06/16/2013    Procedure: OPEN REDUCTION INTERNAL FIXATION (ORIF) RIGHT WRIST FRACTURE ;  Surgeon: Roseanne Kaufman, MD;  Location: [facility];  Service: Orthopedics;  Laterality: Right;    There were no vitals filed for this visit.  Visit Diagnosis:  Abnormality of gait  Falls frequently      Subjective Assessment - 01/22/15 1200    Subjective Impaired balance while turning and standing up/walking, fatigue   Pertinent History COPD, current smoker   Patient Stated Goals walk better and not fall, feel more confident when walking   Currently in Pain? No/denies            [facility] PT Assessment - 01/22/15 1202    Assessment   Medical Diagnosis Balance disturbance with frequent falls   Onset Date 10/07/14   Prior Therapy OPPT after patellar fracture, pt unsure where or when   Precautions   Precautions  Fall   Restrictions   Weight Bearing Restrictions No   Balance Screen   Has the patient fallen in the past 6 months Yes   How many times? 12   Has the patient had a decrease in activity level because of a fear of falling?  Yes   Is the patient reluctant to leave their home because of a fear of falling?  Yes   [place] Private residence   Living Arrangements Alone   Available Help at Discharge Family  dtr-Paulette   Type of [place] to enter  in the process of moving   Entrance Stairs-Number of Steps 7-8   [place] One level   [place] - single point;Walker - 4 wheels;Shower seat - built in  pt slid off built in shower chair, pt will get rubber mat   Prior Function   Level of Independence Independent with basic ADLs;Independent with gait;Independent with transfers   Vocation Retired   Leisure Read, needle work, work puzzles, garden   Cognition   Overall Cognitive Status Impaired/Different from baseline   Area of Impairment --  per pt, "it takes longer to figure things out"   Sensation   Light Touch Appears Intact   Additional Comments  Pt reported intermittent N/T in finger tips.   Coordination   Gross Motor Movements are Fluid and Coordinated Yes   Fine Motor Movements are Fluid and Coordinated Yes   Posture/Postural Control   Posture/Postural Control Postural limitations   Postural Limitations Forward head   ROM / Strength   AROM / PROM / Strength AROM;Strength   AROM   Overall AROM  Within functional limits for tasks performed   Strength   Overall Strength Deficits   Overall Strength Comments B UE WFL. B hip flex: 4/5, knee flex: 3+/5, knee ext: 5/5, ankle dorsiflexion: 4/5., hip abd: 3+/5.   Transfers   Transfers Sit to Stand;Stand to Sit   Sit to Stand 5: Supervision;With upper extremity assist;From chair/3-in-1   Stand to Sit 5: Supervision;With upper extremity assist;To  chair/3-in-1   Ambulation/Gait   Ambulation/Gait Yes   Ambulation/Gait Assistance 4: Min guard;4: Min assist   Ambulation/Gait Assistance Details LOB during turns which required min A to correct.   Ambulation Distance (Feet) 75 Feet   Assistive device Straight cane   Gait Pattern Step-through pattern;Decreased dorsiflexion - right;Decreased dorsiflexion - left;Decreased stride length;Narrow base of support  very guarded   Ambulation Surface Level;Indoor   Gait velocity 0.59ft/sec.  with SPC   Standardized Balance Assessment   Standardized Balance Assessment Berg Balance Test;Timed Up and Go Test   Berg Balance Test   Sit to Stand Able to stand without using hands and stabilize independently   Standing Unsupported Able to stand safely 2 minutes   Sitting with Back Unsupported but Feet Supported on Floor or Stool Able to sit safely and securely 2 minutes   Stand to Sit Sits safely with minimal use of hands   Transfers Able to transfer with verbal cueing and /or supervision   Standing Unsupported with Eyes Closed Able to stand 10 seconds with supervision   Standing Ubsupported with Feet Together Able to place feet together independently and stand for 1 minute with supervision   From Standing, Reach Forward with Outstretched Arm Can reach forward >12 cm safely (5")   From Standing Position, Pick up Object from Floor Able to pick up shoe, needs supervision   From Standing Position, Turn to Look Behind Over each Shoulder Turn sideways only but maintains balance   Turn 360 Degrees Needs close supervision or verbal cueing   Standing Unsupported, Alternately Place Feet on Step/Stool Able to complete >2 steps/needs minimal assist   Standing Unsupported, One Foot in Front Able to plae foot ahead of the other independently and hold 30 seconds   Standing on One Leg Tries to lift leg/unable to hold 3 seconds but remains standing independently   Total Score 38   Timed Up and Go Test   TUG Normal TUG    Normal TUG (seconds) 33.1  with [facility]                           PT Education - 01/22/15 1441    Education provided Yes   Education Details PT discussed results/meaning of gait speed, BERG and TUG. PT educated pt on the importance of using rollator at all times during amb.   Person(s) Educated Patient   Methods Explanation   Comprehension Verbalized understanding          PT Short Term Goals - 01/22/15 1444    PT SHORT TERM GOAL #1   Title Pt will be independent in HEP to improve strenght, balance, and  endurance. Target date: 02/19/15.   Status New   PT SHORT TERM GOAL #2   Title Pt will improve BERG score to >/=42/56 to decrease falls risk. Target date: 02/19/15.   Status New   PT SHORT TERM GOAL #3   Title Pt will report no falls in the last four weeks to improve safety during functional mobility. Target date: 02/19/15.   Status New   PT SHORT TERM GOAL #4   Title Pt will ambulate 300' with LRAD, over even terrain,  with supervison to improve functional mobility. Target date: 02/19/15.   Status New   PT SHORT TERM GOAL #5   Title Pt will improve gait speed to >/=1.45ft/sec. with LRAD to decrease falls risk. Target date: 02/19/15.   Status New           PT Long Term Goals - 01/22/15 1450    PT LONG TERM GOAL #1   Title Pt will verbalize understanding of fall risk strategies to decrease falls risk. Target date: 03/19/15.   Status New   PT LONG TERM GOAL #2   Title Pt will improve BERG score to >/=46/56 to reduce falls risk. Target date: 03/19/15.   Status New   PT LONG TERM GOAL #3   Title Pt will perform TUG with LRAD in >20 seconds to reduce falls risk. Target date: 03/19/15.   Status New   PT LONG TERM GOAL #4   Title Pt will ambulate 600' over even/uneven terrain with LRAD at MOD I level to improve functional mobility. Target date: 03/19/15.   Status New   PT LONG TERM GOAL #5   Title Pt will traverse 8 steps with one handrail at MOD I level to improve  functional mobility. Target date: 03/19/15.   Status New   Additional Long Term Goals   Additional Long Term Goals Yes   PT LONG TERM GOAL #6   Title Pt will be report plans to join fitness center/[employer] upon discharge from PT to maintain gains made during PT. Target date: 03/19/15.   Status New               Plan - 01/22/15 1158    Clinical Impression Statement Pt is a pleasant 81y/o female presenting to OPPT neuro with history of falls. Pt reported she has fallen 2-3 times in the last six days, and too many falls to count over last six months. Pt has fallen and broken her R arm and L knee cap a few years ago, pt unsure of date. Pt reported her balance has become progressively worse over the last few months and she is very fearful of falling. Pt had some difficulty providing dates for medical history. Pt reported she just built a new home in her dtr's backyard, so her family can assist prn. Pt's BERG, TUG and gait speed all indicate pt is at a high risk for falls. Pt also presented with decreased strength and endurance.   Pt will benefit from skilled therapeutic intervention in order to improve on the following deficits Abnormal gait;Decreased endurance;Decreased balance;Decreased knowledge of use of DME;Decreased strength;Decreased mobility   Rehab Potential Good   PT Frequency 2x / week   PT Duration 8 weeks   PT Treatment/Interventions ADLs/Self Care Home Management;Gait training;Neuromuscular re-education;Stair training;Biofeedback;Functional mobility training;Patient/family education;Therapeutic activities;Electrical Stimulation;Therapeutic exercise;Manual techniques;DME Instruction;Balance training   PT Next Visit Plan Initiate balance/strength HEP.   Consulted and Agree with Plan of Care Patient  G-Codes - 01/22/15 1455    Functional Assessment Tool Used BERG: 38/56; gait speed with SPC: 0.62ft/sec; TUG with SPC: 33.1sec.   Functional Limitation Mobility:  Walking and moving around   Mobility: Walking and Moving Around Current Status [phone]) At least 40 percent but less than 60 percent impaired, limited or restricted   Mobility: Walking and Moving Around Goal Status [phone]) At least 1 percent but less than 20 percent impaired, limited or restricted       Problem List Patient Active Problem List   Diagnosis Date Noted  . TOBACCO ABUSE 09/06/2007  . OBSTRUCTIVE SLEEP APNEA 09/06/2007  . COPD 09/06/2007    Vianey Caniglia L 01/22/2015, 2:56 PM  [facility] [address] [place], [place], [address] Phone: [phone]   Fax:  [phone]    Geoffry Paradise, PT,DPT 01/22/2015 2:56 PM Phone: [phone] Fax: [phone]

## 2015-02-11 ENCOUNTER — Encounter: Payer: Self-pay | Admitting: Physical Therapy

## 2015-02-11 ENCOUNTER — Ambulatory Visit: Payer: Medicare Other | Attending: Family Medicine | Admitting: Physical Therapy

## 2015-02-11 DIAGNOSIS — R269 Unspecified abnormalities of gait and mobility: Secondary | ICD-10-CM | POA: Diagnosis present

## 2015-02-11 DIAGNOSIS — R296 Repeated falls: Secondary | ICD-10-CM | POA: Diagnosis present

## 2015-02-11 NOTE — Patient Instructions (Addendum)
Bridging   Slowly raise buttocks from floor, keeping stomach tight. Hold for 5 seconds. Repeat _10___ times per set. Do __1__ sets per session. Do ___1-2_ sessions per day.  http://orth.exer.us/1096   Copyright  VHI. All rights reserved.  Bilateral Isometric Hip Flexion   Tighten stomach and raise both knees to outstretched arms. Push gently, keeping arms straight, trunk rigid. Hold __5_ seconds. Repeat _10___ times per set. Do __1__ sets per session. Do __1-2__ sessions per day.  http://orth.exer.us/1100   Copyright  VHI. All rights reserved.  Straight Leg Raise   Tighten stomach and slowly raise locked right leg __2-3__ inches from floor. Hold for 5 seconds. Repeat __10_ times per set. Do _1__ sets per session. Do __1-2__ sessions per day.  http://orth.exer.us/1102   Copyright  VHI. All rights reserved.  Functional Quadriceps: Sit to Stand   Sit on edge of chair, feet flat on floor. Stand upright, extending knees fully. Repeat _10__ times per set. Do _1_ sets per session. Do __1-2__ sessions per day.  http://orth.exer.us/735   Copyright  VHI. All rights reserved.  "I love a Parade" Lift   At the counter, high knee marching forward and then backwards.  Repeat _3 laps each way. Do _1-2__ sessions per day.  http://gt2.exer.us/345   Copyright  VHI. All rights reserved.  Side-Stepping  At counter for balance, walk sideways one way and then back the other way. Make sure to lift each foot with each step and keep feet facing/pointed to the counter top. Repeat for 3 laps each way. 1-2 times a day. Copyright  VHI. All rights reserved.  Walking on Heels   At counter top, walk on heels forward while continuing on a straight path, and then backwards to starting point. Perform 3 laps each way. 1-2 times a day.  Copyright  VHI. All rights reserved.  Walking on Toes   At counter: walk on toes forward while continuing on a straight path, and then backwards to the  starting point. Perform 3 laps each way.   Do __1-2__ sessions per day.  Copyright  VHI. All rights reserved.

## 2015-02-11 NOTE — Therapy (Signed)
[facility] [address] [place], [place], [address] Phone: [phone]   Fax:  [phone]  Physical Therapy Treatment  Patient Details  Name: Linda Morse MRN: [id number] Date of Birth: Jan 27, 1934 Referring Provider:  Gavin Pound, MD  Encounter Date: 02/11/2015      PT End of Session - 02/11/15 1406    Visit Number 2   Number of Visits 17   Date for PT Re-Evaluation 03/23/15   Authorization Type G-code every 10th visit.   PT Start Time 1403   PT Stop Time 1447   PT Time Calculation (min) 44 min   Equipment Utilized During Treatment Gait belt   Activity Tolerance Patient tolerated treatment well   Behavior During Therapy WFL for tasks assessed/performed      Past Medical History  Diagnosis Date  . Hypercholesterolemia   . Complication of anesthesia     takes 3- 4 days to wake up  . Arthritis   . Sleep apnea     Past Surgical History  Procedure Laterality Date  . Back surgery    . Knee surgery    . Eye surgery Bilateral     catarats  . Abdominal hysterectomy    . Orif wrist fracture Right 06/16/2013    Procedure: OPEN REDUCTION INTERNAL FIXATION (ORIF) RIGHT WRIST FRACTURE ;  Surgeon: Roseanne Kaufman, MD;  Location: [facility];  Service: Orthopedics;  Laterality: Right;    There were no vitals filed for this visit.  Visit Diagnosis:  Abnormality of gait  Falls frequently      Subjective Assessment - 02/11/15 1406    Subjective No new complaints. No falls or pain to report.   Currently in Pain? No/denies     Treatment: Issued HEP for strengthening and balance. Refer to pt education section for full details.  Scifit level 2.5 x 4 extremities x 8 minutes with goal rpm >/= 50 for strengthening and activity tolerance.          PT Education - 02/11/15 1502    Education provided Yes   Education Details HEP- bridge, bil isometric hip flexion, straight leg raise, sit/stands. at counter:  high knee marching, tandem gait, heel/toe gait, all fwd/bwd.   Person(s) Educated Patient   Methods Explanation;Demonstration;Handout   Comprehension Verbalized understanding          PT Short Term Goals - 01/22/15 1444    PT SHORT TERM GOAL #1   Title Pt will be independent in HEP to improve strenght, balance, and endurance. Target date: 02/19/15.   Status New   PT SHORT TERM GOAL #2   Title Pt will improve BERG score to >/=42/56 to decrease falls risk. Target date: 02/19/15.   Status New   PT SHORT TERM GOAL #3   Title Pt will report no falls in the last four weeks to improve safety during functional mobility. Target date: 02/19/15.   Status New   PT SHORT TERM GOAL #4   Title Pt will ambulate 300' with LRAD, over even terrain,  with supervison to improve functional mobility. Target date: 02/19/15.   Status New   PT SHORT TERM GOAL #5   Title Pt will improve gait speed to >/=1.59ft/sec. with LRAD to decrease falls risk. Target date: 02/19/15.   Status New           PT Long Term Goals - 01/22/15 1450    PT LONG TERM GOAL #1   Title Pt will verbalize understanding of fall risk  strategies to decrease falls risk. Target date: 03/19/15.   Status New   PT LONG TERM GOAL #2   Title Pt will improve BERG score to >/=46/56 to reduce falls risk. Target date: 03/19/15.   Status New   PT LONG TERM GOAL #3   Title Pt will perform TUG with LRAD in >20 seconds to reduce falls risk. Target date: 03/19/15.   Status New   PT LONG TERM GOAL #4   Title Pt will ambulate 600' over even/uneven terrain with LRAD at MOD I level to improve functional mobility. Target date: 03/19/15.   Status New   PT LONG TERM GOAL #5   Title Pt will traverse 8 steps with one handrail at MOD I level to improve functional mobility. Target date: 03/19/15.   Status New   Additional Long Term Goals   Additional Long Term Goals Yes   PT LONG TERM GOAL #6   Title Pt will be report plans to join fitness center/[employer]  upon discharge from PT to maintain gains made during PT. Target date: 03/19/15.   Status New           Plan - 02/11/15 1406    Clinical Impression Statement Educated on and issued HEP for strengthening and balance today without any issues reported. Pt making steady progress toward goals.   Pt will benefit from skilled therapeutic intervention in order to improve on the following deficits Abnormal gait;Decreased endurance;Decreased balance;Decreased knowledge of use of DME;Decreased strength;Decreased mobility   Rehab Potential Good   PT Frequency 2x / week   PT Duration 8 weeks   PT Treatment/Interventions ADLs/Self Care Home Management;Gait training;Neuromuscular re-education;Stair training;Biofeedback;Functional mobility training;Patient/family education;Therapeutic activities;Electrical Stimulation;Therapeutic exercise;Manual techniques;DME Instruction;Balance training   PT Next Visit Plan Continue with gait, strengthening and balance activities   Consulted and Agree with Plan of Care Patient        Problem List Patient Active Problem List   Diagnosis Date Noted  . TOBACCO ABUSE 09/06/2007  . OBSTRUCTIVE SLEEP APNEA 09/06/2007  . COPD 09/06/2007    Willow Ora 02/11/2015, 3:07 PM  Willow Ora, PTA, [facility] [address] [place], [place] [address] [phone] 02/11/2015, 3:07 PM

## 2015-02-12 ENCOUNTER — Ambulatory Visit: Payer: Medicare Other | Admitting: Physical Therapy

## 2015-02-12 ENCOUNTER — Encounter: Payer: Self-pay | Admitting: Physical Therapy

## 2015-02-12 DIAGNOSIS — R269 Unspecified abnormalities of gait and mobility: Secondary | ICD-10-CM | POA: Diagnosis not present

## 2015-02-12 DIAGNOSIS — R296 Repeated falls: Secondary | ICD-10-CM

## 2015-02-12 NOTE — Therapy (Signed)
[facility] [address] [place], [place], [address] Phone: [phone]   Fax:  [phone]  Physical Therapy Treatment  Patient Details  Name: Linda Morse MRN: [id number] Date of Birth: 04/28/1934 Referring Provider:  Gavin Pound, MD  Encounter Date: 02/12/2015      PT End of Session - 02/12/15 1408    Visit Number 3   Number of Visits 17   Date for PT Re-Evaluation 03/23/15   Authorization Type G-code every 10th visit.   PT Start Time 1404   PT Stop Time 1445   PT Time Calculation (min) 41 min   Equipment Utilized During Treatment Gait belt   Activity Tolerance Patient tolerated treatment well   Behavior During Therapy WFL for tasks assessed/performed      Past Medical History  Diagnosis Date  . Hypercholesterolemia   . Complication of anesthesia     takes 3- 4 days to wake up  . Arthritis   . Sleep apnea     Past Surgical History  Procedure Laterality Date  . Back surgery    . Knee surgery    . Eye surgery Bilateral     catarats  . Abdominal hysterectomy    . Orif wrist fracture Right 06/16/2013    Procedure: OPEN REDUCTION INTERNAL FIXATION (ORIF) RIGHT WRIST FRACTURE ;  Surgeon: Roseanne Kaufman, MD;  Location: [facility];  Service: Orthopedics;  Laterality: Right;    There were no vitals filed for this visit.  Visit Diagnosis:  Abnormality of gait  Falls frequently      Subjective Assessment - 02/12/15 1408    Subjective No new complaints. No falls or pain to report.   Currently in Pain? No/denies           [facility] Adult PT Treatment/Exercise - 02/12/15 1409    Transfers   Sit to Stand 5: Supervision;With upper extremity assist;From chair/3-in-1   Stand to Sit 5: Supervision;With upper extremity assist;To chair/3-in-1   Ambulation/Gait   Ambulation/Gait Yes   Ambulation/Gait Assistance 4: Min guard;4: Min assist   Ambulation/Gait Assistance Details  cues on posture, equal step  length and for increased left foot clearance. trialed foot up brace on second lap with minimal improvement noted in foot clearance with swing phase of gait. moderate cues needed for correction of gait deviations.          Ambulation Distance (Feet) 115 Feet  x 2   Assistive device Straight cane   Gait Pattern Step-through pattern;Decreased stride length;Decreased step length - left;Decreased dorsiflexion - left;Shuffle;Narrow base of support;Poor foot clearance - left   Ambulation Surface Level;Indoor     Exercise: Standing with UE support on chair Heel raises x 10 reps Toe raises x 10 reps Alternating hip abduction x 10 reps each leg Mini squats x 10 reps  Neuro Re-ed Single leg stance activities Alternating heel taps fwd and cross x 10 each bil legs to both a 4 inch box and 6 inch box         PT Short Term Goals - 01/22/15 1444    PT SHORT TERM GOAL #1   Title Pt will be independent in HEP to improve strenght, balance, and endurance. Target date: 02/19/15.   Status New   PT SHORT TERM GOAL #2   Title Pt will improve BERG score to >/=42/56 to decrease falls risk. Target date: 02/19/15.   Status New   PT SHORT TERM GOAL #3   Title Pt will report no  falls in the last four weeks to improve safety during functional mobility. Target date: 02/19/15.   Status New   PT SHORT TERM GOAL #4   Title Pt will ambulate 300' with LRAD, over even terrain,  with supervison to improve functional mobility. Target date: 02/19/15.   Status New   PT SHORT TERM GOAL #5   Title Pt will improve gait speed to >/=1.106ft/sec. with LRAD to decrease falls risk. Target date: 02/19/15.   Status New           PT Long Term Goals - 01/22/15 1450    PT LONG TERM GOAL #1   Title Pt will verbalize understanding of fall risk strategies to decrease falls risk. Target date: 03/19/15.   Status New   PT LONG TERM GOAL #2   Title Pt will improve BERG score to >/=46/56 to reduce falls risk. Target date: 03/19/15.    Status New   PT LONG TERM GOAL #3   Title Pt will perform TUG with LRAD in >20 seconds to reduce falls risk. Target date: 03/19/15.   Status New   PT LONG TERM GOAL #4   Title Pt will ambulate 600' over even/uneven terrain with LRAD at MOD I level to improve functional mobility. Target date: 03/19/15.   Status New   PT LONG TERM GOAL #5   Title Pt will traverse 8 steps with one handrail at MOD I level to improve functional mobility. Target date: 03/19/15.   Status New   Additional Long Term Goals   Additional Long Term Goals Yes   PT LONG TERM GOAL #6   Title Pt will be report plans to join fitness center/[employer] upon discharge from PT to maintain gains made during PT. Target date: 03/19/15.   Status New           Plan - 02/12/15 1408    Clinical Impression Statement Pt challenged by higher level balance activities today. Trialed foot up brace for increased foot clearance with little improvement. Pt making progress toward goals.   Pt will benefit from skilled therapeutic intervention in order to improve on the following deficits Abnormal gait;Decreased endurance;Decreased balance;Decreased knowledge of use of DME;Decreased strength;Decreased mobility   Rehab Potential Good   PT Frequency 2x / week   PT Duration 8 weeks   PT Treatment/Interventions ADLs/Self Care Home Management;Gait training;Neuromuscular re-education;Stair training;Biofeedback;Functional mobility training;Patient/family education;Therapeutic activities;Electrical Stimulation;Therapeutic exercise;Manual techniques;DME Instruction;Balance training   PT Next Visit Plan Continue with gait, strengthening and balance activities   Consulted and Agree with Plan of Care Patient        Problem List Patient Active Problem List   Diagnosis Date Noted  . TOBACCO ABUSE 09/06/2007  . OBSTRUCTIVE SLEEP APNEA 09/06/2007  . COPD 09/06/2007    Willow Ora 02/13/2015, 9:28 PM  Willow Ora, PTA, [facility] [address], [place], [place] [address] [phone] 02/13/2015, 9:28 PM

## 2015-02-17 ENCOUNTER — Encounter: Payer: Self-pay | Admitting: Physical Therapy

## 2015-02-17 ENCOUNTER — Ambulatory Visit: Payer: Medicare Other | Admitting: Physical Therapy

## 2015-02-17 DIAGNOSIS — R269 Unspecified abnormalities of gait and mobility: Secondary | ICD-10-CM | POA: Diagnosis not present

## 2015-02-17 DIAGNOSIS — R296 Repeated falls: Secondary | ICD-10-CM

## 2015-02-17 NOTE — Therapy (Signed)
[facility] [address] [place], [place], [address] Phone: [phone]   Fax:  [phone]  Physical Therapy Treatment  Patient Details  Name: Linda Morse MRN: [id number] Date of Birth: 07-19-1934 Referring Provider:  Gavin Pound, MD  Encounter Date: 02/17/2015      PT End of Session - 02/17/15 1320    Visit Number 4   Number of Visits 17   Date for PT Re-Evaluation 03/23/15   Authorization Type G-code every 10th visit.   PT Start Time 1317   PT Stop Time 1358   PT Time Calculation (min) 41 min   Equipment Utilized During Treatment Gait belt   Activity Tolerance Patient tolerated treatment well   Behavior During Therapy WFL for tasks assessed/performed      Past Medical History  Diagnosis Date  . Hypercholesterolemia   . Complication of anesthesia     takes 3- 4 days to wake up  . Arthritis   . Sleep apnea     Past Surgical History  Procedure Laterality Date  . Back surgery    . Knee surgery    . Eye surgery Bilateral     catarats  . Abdominal hysterectomy    . Orif wrist fracture Right 06/16/2013    Procedure: OPEN REDUCTION INTERNAL FIXATION (ORIF) RIGHT WRIST FRACTURE ;  Surgeon: Roseanne Kaufman, MD;  Location: [facility];  Service: Orthopedics;  Laterality: Right;    There were no vitals filed for this visit.  Visit Diagnosis:  Abnormality of gait  Falls frequently      Subjective Assessment - 02/17/15 1319    Subjective No new complaints. No falls or pain to report.   Currently in Pain? No/denies           [facility] Adult PT Treatment/Exercise - 02/17/15 1321    Transfers   Sit to Stand 5: Supervision;With upper extremity assist;From chair/3-in-1   Stand to Sit 5: Supervision;With upper extremity assist;To chair/3-in-1   Ambulation/Gait   Ambulation/Gait Yes   Ambulation/Gait Assistance 5: Supervision   Ambulation/Gait Assistance Details mod cues on posture (to look up) and for  left foot clearance with swing phase   Ambulation Distance (Feet) 330 Feet  x1 with walker;330 x1 with cane   Assistive device Rollator;Straight cane   Gait Pattern Step-through pattern;Decreased stride length;Decreased step length - left;Decreased dorsiflexion - left;Shuffle;Narrow base of support;Poor foot clearance - left   Ambulation Surface Level;Indoor   Gait velocity 15.91 sec's= 2.06 ft/sec with rollator;28.16 sec's= 1.64 ft/sec with staight cane   Berg Balance Test   Sit to Stand Able to stand without using hands and stabilize independently   Standing Unsupported Able to stand safely 2 minutes   Sitting with Back Unsupported but Feet Supported on Floor or Stool Able to sit safely and securely 2 minutes   Stand to Sit Sits safely with minimal use of hands   Transfers Able to transfer safely, minor use of hands   Standing Unsupported with Eyes Closed Able to stand 10 seconds safely   Standing Ubsupported with Feet Together Able to place feet together independently and stand 1 minute safely   From Standing, Reach Forward with Outstretched Arm Can reach forward >12 cm safely (5")  5.5 inches   From Standing Position, Pick up Object from Floor Able to pick up shoe, needs supervision   From Standing Position, Turn to Look Behind Over each Shoulder Looks behind one side only/other side shows less weight shift  right >  left   Turn 360 Degrees Able to turn 360 degrees safely but slowly  > 8 sec's each way   Standing Unsupported, Alternately Place Feet on Step/Stool Able to stand independently and complete 8 steps >20 seconds  22.16 sec's   Standing Unsupported, One Foot in Front Able to take small step independently and hold 30 seconds   Standing on One Leg Able to lift leg independently and hold equal to or more than 3 seconds  right stance   Total Score 46            PT Short Term Goals - 02/17/15 1320    PT SHORT TERM GOAL #1   Title Pt will be independent in HEP to improve  strenght, balance, and endurance. Target date: 02/19/15.   Baseline 02/17/15: met today   Status Achieved   PT SHORT TERM GOAL #2   Title Pt will improve BERG score to >/=42/56 to decrease falls risk. Target date: 02/19/15.   Baseline 02/17/15: met today with score 46/56   Status Achieved   PT SHORT TERM GOAL #3   Title Pt will report no falls in the last four weeks to improve safety during functional mobility. Target date: 02/19/15.   Baseline 02/17/15: met today   Status Achieved   PT SHORT TERM GOAL #4   Title Pt will ambulate 300' with LRAD, over even terrain,  with supervison to improve functional mobility. Target date: 02/19/15.   Baseline 02/17/15: met at rollator level, needs min guard assist at times with straight cane   Status Achieved   PT SHORT TERM GOAL #5   Title Pt will improve gait speed to >/=1.58ft/sec. with LRAD to decrease falls risk. Target date: 02/19/15.   Baseline 02/17/15: met with rollator- 2.06 ft/sec. not met with cane- 1.64 ft/sec   Status Achieved           PT Long Term Goals - 01/22/15 1450    PT LONG TERM GOAL #1   Title Pt will verbalize understanding of fall risk strategies to decrease falls risk. Target date: 03/19/15.   Status New   PT LONG TERM GOAL #2   Title Pt will improve BERG score to >/=46/56 to reduce falls risk. Target date: 03/19/15.   Status New   PT LONG TERM GOAL #3   Title Pt will perform TUG with LRAD in >20 seconds to reduce falls risk. Target date: 03/19/15.   Status New   PT LONG TERM GOAL #4   Title Pt will ambulate 600' over even/uneven terrain with LRAD at MOD I level to improve functional mobility. Target date: 03/19/15.   Status New   PT LONG TERM GOAL #5   Title Pt will traverse 8 steps with one handrail at MOD I level to improve functional mobility. Target date: 03/19/15.   Status New   Additional Long Term Goals   Additional Long Term Goals Yes   PT LONG TERM GOAL #6   Title Pt will be report plans to join fitness center/[employer] upon discharge from PT to maintain gains made during PT. Target date: 03/19/15.   Status New           Plan - 02/17/15 1320    Pt will benefit from skilled therapeutic intervention in order to improve on the following deficits Abnormal gait;Decreased endurance;Decreased balance;Decreased knowledge of use of DME;Decreased strength;Decreased mobility   Rehab Potential Good   PT Frequency 2x / week   PT Duration 8 weeks  PT Treatment/Interventions ADLs/Self Care Home Management;Gait training;Neuromuscular re-education;Stair training;Biofeedback;Functional mobility training;Patient/family education;Therapeutic activities;Electrical Stimulation;Therapeutic exercise;Manual techniques;DME Instruction;Balance training   PT Next Visit Plan Continue with gait, strengthening and balance activities   Consulted and Agree with Plan of Care Patient        Problem List Patient Active Problem List   Diagnosis Date Noted  . TOBACCO ABUSE 09/06/2007  . OBSTRUCTIVE SLEEP APNEA 09/06/2007  . COPD 09/06/2007    Willow Ora 02/17/2015, 7:19 PM  Willow Ora, PTA, [facility] [address] [place], [place] [address] [phone] 02/17/2015, 7:19 PM

## 2015-02-19 ENCOUNTER — Ambulatory Visit: Payer: Medicare Other | Admitting: Physical Therapy

## 2015-02-19 ENCOUNTER — Encounter: Payer: Self-pay | Admitting: Physical Therapy

## 2015-02-19 DIAGNOSIS — R269 Unspecified abnormalities of gait and mobility: Secondary | ICD-10-CM | POA: Diagnosis not present

## 2015-02-19 DIAGNOSIS — R296 Repeated falls: Secondary | ICD-10-CM

## 2015-02-19 NOTE — Therapy (Signed)
[facility] [address] [place], [place], [address] Phone: [phone]   Fax:  [phone]  Physical Therapy Treatment  Patient Details  Name: Linda Morse MRN: [id number] Date of Birth: 1934-02-03 Referring Provider:  Gavin Pound, MD  Encounter Date: 02/19/2015      PT End of Session - 02/19/15 1456    Visit Number 5   Number of Visits 17   Date for PT Re-Evaluation 03/23/15   Authorization Type G-code every 10th visit.   PT Start Time 1450   PT Stop Time 1531   PT Time Calculation (min) 41 min   Equipment Utilized During Treatment Gait belt   Activity Tolerance Patient tolerated treatment well   Behavior During Therapy WFL for tasks assessed/performed      Past Medical History  Diagnosis Date  . Hypercholesterolemia   . Complication of anesthesia     takes 3- 4 days to wake up  . Arthritis   . Sleep apnea     Past Surgical History  Procedure Laterality Date  . Back surgery    . Knee surgery    . Eye surgery Bilateral     catarats  . Abdominal hysterectomy    . Orif wrist fracture Right 06/16/2013    Procedure: OPEN REDUCTION INTERNAL FIXATION (ORIF) RIGHT WRIST FRACTURE ;  Surgeon: Roseanne Kaufman, MD;  Location: [facility];  Service: Orthopedics;  Laterality: Right;    There were no vitals filed for this visit.  Visit Diagnosis:  Abnormality of gait  Falls frequently      Subjective Assessment - 02/19/15 1456    Subjective No new complaints. No falls or pain to report.   Currently in Pain? No/denies           [facility] Adult PT Treatment/Exercise - 02/19/15 1457    Transfers   Sit to Stand 5: Supervision;With upper extremity assist;From chair/3-in-1   Stand to Sit 5: Supervision;With upper extremity assist;To chair/3-in-1   Ambulation/Gait   Ambulation/Gait Yes   Ambulation/Gait Assistance 4: Min guard;4: Min assist   Ambulation/Gait Assistance Details cues for posture/to look up and  for increased left foot clearance with gait. Attempted to used clinic sample orthotics/braces, all were too big for her shoes. Used green theraband assisted hip/knee flexion and DF with second gait trial with improved foot clearance.                               Ambulation Distance (Feet) 200 Feet  x1, 115 x1,   Assistive device Straight cane   Gait Pattern Step-through pattern;Decreased stride length;Decreased step length - left;Decreased dorsiflexion - left;Shuffle;Narrow base of support;Poor foot clearance - left   Ambulation Surface Level;Indoor     Neuro Re-ed: single leg stance activities with green theraband assisted DF/knee/hip flexion to left leg: With tall cones - alternating fwd toe taps, cross toe taps, fwd double toe taps and cross double toe taps x 10 each bil legs.  with blue foam beam Sit<>stands with feet across foam beam x 10 without UE support/assist Standing with feet across foam beam: alternating fwd heel taps x 10 reps each leg and alternating bwd toe taps x 10 reps each leg with on UE support, up to min assist for balance.         PT Short Term Goals - 02/17/15 1320    PT SHORT TERM GOAL #1   Title Pt will be independent  in HEP to improve strenght, balance, and endurance. Target date: 02/19/15.   Baseline 02/17/15: met today   Status Achieved   PT SHORT TERM GOAL #2   Title Pt will improve BERG score to >/=42/56 to decrease falls risk. Target date: 02/19/15.   Baseline 02/17/15: met today with score 46/56   Status Achieved   PT SHORT TERM GOAL #3   Title Pt will report no falls in the last four weeks to improve safety during functional mobility. Target date: 02/19/15.   Baseline 02/17/15: met today   Status Achieved   PT SHORT TERM GOAL #4   Title Pt will ambulate 300' with LRAD, over even terrain,  with supervison to improve functional mobility. Target date: 02/19/15.   Baseline 02/17/15: met at rollator level, needs min guard assist at times with straight cane    Status Achieved   PT SHORT TERM GOAL #5   Title Pt will improve gait speed to >/=1.32ft/sec. with LRAD to decrease falls risk. Target date: 02/19/15.   Baseline 02/17/15: met with rollator- 2.06 ft/sec. not met with cane- 1.64 ft/sec   Status Achieved           PT Long Term Goals - 01/22/15 1450    PT LONG TERM GOAL #1   Title Pt will verbalize understanding of fall risk strategies to decrease falls risk. Target date: 03/19/15.   Status New   PT LONG TERM GOAL #2   Title Pt will improve BERG score to >/=46/56 to reduce falls risk. Target date: 03/19/15.   Status New   PT LONG TERM GOAL #3   Title Pt will perform TUG with LRAD in >20 seconds to reduce falls risk. Target date: 03/19/15.   Status New   PT LONG TERM GOAL #4   Title Pt will ambulate 600' over even/uneven terrain with LRAD at MOD I level to improve functional mobility. Target date: 03/19/15.   Status New   PT LONG TERM GOAL #5   Title Pt will traverse 8 steps with one handrail at MOD I level to improve functional mobility. Target date: 03/19/15.   Status New   Additional Long Term Goals   Additional Long Term Goals Yes   PT LONG TERM GOAL #6   Title Pt will be report plans to join fitness center/[employer] upon discharge from PT to maintain gains made during PT. Target date: 03/19/15.   Status New           Plan - 02/19/15 1457    Clinical Impression Statement Pt continues to be challanged with higher level balance activities. Making steady progress toward goals.   Pt will benefit from skilled therapeutic intervention in order to improve on the following deficits Abnormal gait;Decreased endurance;Decreased balance;Decreased knowledge of use of DME;Decreased strength;Decreased mobility   Rehab Potential Good   PT Frequency 2x / week   PT Duration 8 weeks   PT Treatment/Interventions ADLs/Self Care Home Management;Gait training;Neuromuscular re-education;Stair training;Biofeedback;Functional mobility  training;Patient/family education;Therapeutic activities;Electrical Stimulation;Therapeutic exercise;Manual techniques;DME Instruction;Balance training   PT Next Visit Plan Continue with gait, strengthening and balance activities   Consulted and Agree with Plan of Care Patient        Problem List Patient Active Problem List   Diagnosis Date Noted  . TOBACCO ABUSE 09/06/2007  . OBSTRUCTIVE SLEEP APNEA 09/06/2007  . COPD 09/06/2007    Willow Ora 02/20/2015, 6:34 PM  Willow Ora, PTA, [facility] [address], [place], [place] [address] [phone] 02/20/2015,  6:34 PM

## 2015-02-24 ENCOUNTER — Encounter: Payer: Self-pay | Admitting: Physical Therapy

## 2015-02-24 ENCOUNTER — Ambulatory Visit: Payer: Medicare Other | Admitting: Physical Therapy

## 2015-02-24 DIAGNOSIS — R296 Repeated falls: Secondary | ICD-10-CM

## 2015-02-24 DIAGNOSIS — R269 Unspecified abnormalities of gait and mobility: Secondary | ICD-10-CM

## 2015-02-24 NOTE — Therapy (Signed)
[facility] [address] [place], [place], [address] Phone: [phone]   Fax:  [phone]  Physical Therapy Treatment  Patient Details  Name: Linda Morse MRN: [id number] Date of Birth: 01/14/1934 Referring Provider:  Gavin Pound, MD  Encounter Date: 02/24/2015      PT End of Session - 02/24/15 1458    Visit Number 6   Number of Visits 17   Date for PT Re-Evaluation 03/23/15   Authorization Type G-code every 10th visit.   PT Start Time 1452   PT Stop Time 1530   PT Time Calculation (min) 38 min   Equipment Utilized During Treatment Gait belt   Activity Tolerance Patient tolerated treatment well   Behavior During Therapy WFL for tasks assessed/performed      Past Medical History  Diagnosis Date  . Hypercholesterolemia   . Complication of anesthesia     takes 3- 4 days to wake up  . Arthritis   . Sleep apnea     Past Surgical History  Procedure Laterality Date  . Back surgery    . Knee surgery    . Eye surgery Bilateral     catarats  . Abdominal hysterectomy    . Orif wrist fracture Right 06/16/2013    Procedure: OPEN REDUCTION INTERNAL FIXATION (ORIF) RIGHT WRIST FRACTURE ;  Surgeon: Roseanne Kaufman, MD;  Location: [facility];  Service: Orthopedics;  Laterality: Right;    There were no vitals filed for this visit.  Visit Diagnosis:  Abnormality of gait  Falls frequently      Subjective Assessment - 02/24/15 1458    Subjective No new complaints. No falls or pain to report.   Currently in Pain? No/denies          [facility] Adult PT Treatment/Exercise - 02/24/15 1459    Ambulation/Gait   Ambulation/Gait Yes   Ambulation/Gait Assistance 4: Min guard   Ambulation/Gait Assistance Details trialed bil posterior ottobock braces for increased foot clearance. still with left toe dragg/scuffing. leg length checked and pelvis appears level. same assist needed with rollator and cane with gait.                  Ambulation Distance (Feet) 115 Feet  x1 rollator; 230 x 1 cane   Assistive device Straight cane;Rollator   Gait Pattern Step-through pattern;Decreased stride length;Decreased hip/knee flexion - left;Decreased hip/knee flexion - right;Shuffle;Decreased trunk rotation;Poor foot clearance - left;Poor foot clearance - right  left > right toe drap/shuffle   Ambulation Surface Level;Indoor      Exercises Seated edge of mat: heel cord/hamstring stretch with belt 20 sec hold x 3 each side. Cues on technique/ex form. Standing with toes on 2 inch box/heels on floor: calf stretch 20 sec's x 3 holds Standing with chair support: toe's up/lift, 5 sec hold x 10 reps, cues on posture and ex form.         PT Short Term Goals - 02/17/15 1320    PT SHORT TERM GOAL #1   Title Pt will be independent in HEP to improve strenght, balance, and endurance. Target date: 02/19/15.   Baseline 02/17/15: met today   Status Achieved   PT SHORT TERM GOAL #2   Title Pt will improve BERG score to >/=42/56 to decrease falls risk. Target date: 02/19/15.   Baseline 02/17/15: met today with score 46/56   Status Achieved   PT SHORT TERM GOAL #3   Title Pt will report no falls in the last  four weeks to improve safety during functional mobility. Target date: 02/19/15.   Baseline 02/17/15: met today   Status Achieved   PT SHORT TERM GOAL #4   Title Pt will ambulate 300' with LRAD, over even terrain,  with supervison to improve functional mobility. Target date: 02/19/15.   Baseline 02/17/15: met at rollator level, needs min guard assist at times with straight cane   Status Achieved   PT SHORT TERM GOAL #5   Title Pt will improve gait speed to >/=1.26f/sec. with LRAD to decrease falls risk. Target date: 02/19/15.   Baseline 02/17/15: met with rollator- 2.06 ft/sec. not met with cane- 1.64 ft/sec   Status Achieved           PT Long Term Goals - 01/22/15 1450    PT LONG TERM GOAL #1   Title Pt will verbalize understanding  of fall risk strategies to decrease falls risk. Target date: 03/19/15.   Status New   PT LONG TERM GOAL #2   Title Pt will improve BERG score to >/=46/56 to reduce falls risk. Target date: 03/19/15.   Status New   PT LONG TERM GOAL #3   Title Pt will perform TUG with LRAD in >20 seconds to reduce falls risk. Target date: 03/19/15.   Status New   PT LONG TERM GOAL #4   Title Pt will ambulate 600' over even/uneven terrain with LRAD at MOD I level to improve functional mobility. Target date: 03/19/15.   Status New   PT LONG TERM GOAL #5   Title Pt will traverse 8 steps with one handrail at MOD I level to improve functional mobility. Target date: 03/19/15.   Status New   Additional Long Term Goals   Additional Long Term Goals Yes   PT LONG TERM GOAL #6   Title Pt will be report plans to join fitness center/[employer] upon discharge from PT to maintain gains made during PT. Target date: 03/19/15.   Status New            Plan - 02/24/15 1458    Clinical Impression Statement Pt continues to toe walker/drag toes with gait despite cues or use of braces (have tried foot up and posterior ottobock brace's). Pt is albe to lift toes against gravity and hold with exerices  and achieve PF past neurtral, unalbe to carry over with gait or dynamic balance activities. Making steady progress toward goals.                                                 Pt will benefit from skilled therapeutic intervention in order to improve on the following deficits Abnormal gait;Decreased endurance;Decreased balance;Decreased knowledge of use of DME;Decreased strength;Decreased mobility   Rehab Potential Good   PT Frequency 2x / week   PT Duration 8 weeks   PT Treatment/Interventions ADLs/Self Care Home Management;Gait training;Neuromuscular re-education;Stair training;Biofeedback;Functional mobility training;Patient/family education;Therapeutic activities;Electrical Stimulation;Therapeutic exercise;Manual techniques;DME  Instruction;Balance training   PT Next Visit Plan Continue with gait (trial other braces),  strengthening (specifically ankles and anterior tibialis) and balance activities   Consulted and Agree with Plan of Care Patient        Problem List Patient Active Problem List   Diagnosis Date Noted  . TOBACCO ABUSE 09/06/2007  . OBSTRUCTIVE SLEEP APNEA 09/06/2007  . COPD 09/06/2007    BWillow Ora6/20/2016, 6:50  PM  Willow Ora, PTA, [facility] [address], [place], [place] [address] [phone] 02/24/2015, 6:50 PM

## 2015-02-27 ENCOUNTER — Ambulatory Visit: Payer: Medicare Other | Admitting: Physical Therapy

## 2015-02-27 ENCOUNTER — Encounter: Payer: Self-pay | Admitting: Physical Therapy

## 2015-02-27 DIAGNOSIS — R296 Repeated falls: Secondary | ICD-10-CM

## 2015-02-27 DIAGNOSIS — R269 Unspecified abnormalities of gait and mobility: Secondary | ICD-10-CM | POA: Diagnosis not present

## 2015-02-27 NOTE — Therapy (Signed)
[facility] [address] [place], [place], [address] Phone: [phone]   Fax:  [phone]  Physical Therapy Treatment  Patient Details  Name: Linda Morse MRN: [id number] Date of Birth: 01/12/1934 Referring Provider:  Gavin Pound, MD  Encounter Date: 02/27/2015      PT End of Session - 02/27/15 1403    Visit Number 7   Number of Visits 17   Date for PT Re-Evaluation 03/23/15   Authorization Type G-code every 10th visit.   PT Start Time 1400   PT Stop Time 1445   PT Time Calculation (min) 45 min   Equipment Utilized During Treatment Gait belt   Activity Tolerance Patient tolerated treatment well   Behavior During Therapy WFL for tasks assessed/performed      Past Medical History  Diagnosis Date  . Hypercholesterolemia   . Complication of anesthesia     takes 3- 4 days to wake up  . Arthritis   . Sleep apnea     Past Surgical History  Procedure Laterality Date  . Back surgery    . Knee surgery    . Eye surgery Bilateral     catarats  . Abdominal hysterectomy    . Orif wrist fracture Right 06/16/2013    Procedure: OPEN REDUCTION INTERNAL FIXATION (ORIF) RIGHT WRIST FRACTURE ;  Surgeon: Roseanne Kaufman, MD;  Location: [facility];  Service: Orthopedics;  Laterality: Right;    There were no vitals filed for this visit.  Visit Diagnosis:  Abnormality of gait  Falls frequently      Subjective Assessment - 02/27/15 1403    Subjective No new complaints. No falls or pain to report.   Currently in Pain? No/denies        02/27/15 0001  Transfers  Sit to Stand 5: Supervision;With upper extremity assist;From chair/3-in-1  Stand to Sit 5: Supervision;With upper extremity assist;To chair/3-in-1  Ambulation/Gait  Ambulation/Gait Yes  Ambulation/Gait Assistance 4: Min guard;4: Min assist;5: Supervision  Ambulation/Gait Assistance Details trialed anterior ottobock braces to bil legs with no change in  left foot/toe drag, right slightly improved. removed braces from both legs and trialed green theraband assisted DF/knee/hip flexion with second lap.                     Ambulation Distance (Feet) 115 Feet (x1; 230 x1: both with rollator;)  Assistive device Rollator  Gait Pattern Step-through pattern;Decreased stride length;Decreased hip/knee flexion - left;Decreased hip/knee flexion - right;Shuffle;Decreased trunk rotation;Poor foot clearance - left;Poor foot clearance - right (left > right foot/toe drag, pt able to self correct right )  Ambulation Surface Level;Indoor    Neuro re-ed: Left leg kicking bean bag to promote increase left knee/hip flexion with swing and increased DF with swing phase  At steps: up/down foot taps to bottom 3 steps, up then down, x 10 full reps each leg to work on hip/knee flexion and strengthening. Cues to "lift" the foot each time and not to "slide" foot down, "drag" foot up.  Toe walking 30 feet fwd with min HHA, cues on posture/ex form  Heel walking along ~30 feet path both fwd and bwd, min HHA with mod cues to maintain form with toes up for ant tibialis/ankle strengthening and to promote ankle stability.       PT Short Term Goals - 02/17/15 1320    PT SHORT TERM GOAL #1   Title Pt will be independent in HEP to improve strenght, balance, and endurance.  Target date: 02/19/15.   Baseline 02/17/15: met today   Status Achieved   PT SHORT TERM GOAL #2   Title Pt will improve BERG score to >/=42/56 to decrease falls risk. Target date: 02/19/15.   Baseline 02/17/15: met today with score 46/56   Status Achieved   PT SHORT TERM GOAL #3   Title Pt will report no falls in the last four weeks to improve safety during functional mobility. Target date: 02/19/15.   Baseline 02/17/15: met today   Status Achieved   PT SHORT TERM GOAL #4   Title Pt will ambulate 300' with LRAD, over even terrain,  with supervison to improve functional mobility. Target date: 02/19/15.    Baseline 02/17/15: met at rollator level, needs min guard assist at times with straight cane   Status Achieved   PT SHORT TERM GOAL #5   Title Pt will improve gait speed to >/=1.54f/sec. with LRAD to decrease falls risk. Target date: 02/19/15.   Baseline 02/17/15: met with rollator- 2.06 ft/sec. not met with cane- 1.64 ft/sec   Status Achieved           PT Long Term Goals - 01/22/15 1450    PT LONG TERM GOAL #1   Title Pt will verbalize understanding of fall risk strategies to decrease falls risk. Target date: 03/19/15.   Status New   PT LONG TERM GOAL #2   Title Pt will improve BERG score to >/=46/56 to reduce falls risk. Target date: 03/19/15.   Status New   PT LONG TERM GOAL #3   Title Pt will perform TUG with LRAD in >20 seconds to reduce falls risk. Target date: 03/19/15.   Status New   PT LONG TERM GOAL #4   Title Pt will ambulate 600' over even/uneven terrain with LRAD at MOD I level to improve functional mobility. Target date: 03/19/15.   Status New   PT LONG TERM GOAL #5   Title Pt will traverse 8 steps with one handrail at MOD I level to improve functional mobility. Target date: 03/19/15.   Status New   Additional Long Term Goals   Additional Long Term Goals Yes   PT LONG TERM GOAL #6   Title Pt will be report plans to join fitness center/[employer] upon discharge from PT to maintain gains made during PT. Target date: 03/19/15.   Status New            Plan - 02/27/15 1403    Clinical Impression Statement Trialed multiple off shelf braces with no improvement in foot drop noted. Trialed theraband assisted DF/hip and knee flexion with some improvement noted. Continued to work on balance. Pt continues to be challenged on complaint surfaces and with single leg stance activities. Pt making progress toward goals.                  Pt will benefit from skilled therapeutic intervention in order to improve on the following deficits Abnormal gait;Decreased endurance;Decreased  balance;Decreased knowledge of use of DME;Decreased strength;Decreased mobility   Rehab Potential Good   PT Frequency 2x / week   PT Duration 8 weeks   PT Treatment/Interventions ADLs/Self Care Home Management;Gait training;Neuromuscular re-education;Stair training;Biofeedback;Functional mobility training;Patient/family education;Therapeutic activities;Electrical Stimulation;Therapeutic exercise;Manual techniques;DME Instruction;Balance training   PT Next Visit Plan strengthening (specifically ankles and anterior tibialis) and balance activities   Recommended Other Services ? orthotic consult for foot drop/toe scuffing   Consulted and Agree with Plan of Care Patient  Problem List Patient Active Problem List   Diagnosis Date Noted  . TOBACCO ABUSE 09/06/2007  . OBSTRUCTIVE SLEEP APNEA 09/06/2007  . COPD 09/06/2007    Willow Ora 02/28/2015, 6:35 PM  Willow Ora, PTA, [facility] [address] [place], [place] [address] [phone] 02/28/2015, 6:35 PM

## 2015-03-03 ENCOUNTER — Ambulatory Visit: Payer: Medicare Other | Admitting: Physical Therapy

## 2015-03-03 ENCOUNTER — Encounter: Payer: Self-pay | Admitting: Physical Therapy

## 2015-03-03 DIAGNOSIS — R269 Unspecified abnormalities of gait and mobility: Secondary | ICD-10-CM

## 2015-03-03 DIAGNOSIS — R296 Repeated falls: Secondary | ICD-10-CM

## 2015-03-03 NOTE — Therapy (Signed)
[facility] [address] [place], [place], [address] Phone: [phone]   Fax:  [phone]  Physical Therapy Treatment  Patient Details  Name: Linda Morse MRN: [id number] Date of Birth: 09-Mar-1934 Referring Provider:  Gavin Pound, MD  Encounter Date: 03/03/2015      PT End of Session - 03/03/15 1535    Visit Number 8   Number of Visits 17   Date for PT Re-Evaluation 03/23/15   Authorization Type G-code every 10th visit.   PT Start Time 1408   PT Stop Time 1450   PT Time Calculation (min) 42 min   Equipment Utilized During Treatment Gait belt   Activity Tolerance Patient tolerated treatment well   Behavior During Therapy WFL for tasks assessed/performed      Past Medical History  Diagnosis Date  . Hypercholesterolemia   . Complication of anesthesia     takes 3- 4 days to wake up  . Arthritis   . Sleep apnea     Past Surgical History  Procedure Laterality Date  . Back surgery    . Knee surgery    . Eye surgery Bilateral     catarats  . Abdominal hysterectomy    . Orif wrist fracture Right 06/16/2013    Procedure: OPEN REDUCTION INTERNAL FIXATION (ORIF) RIGHT WRIST FRACTURE ;  Surgeon: Roseanne Kaufman, MD;  Location: [facility];  Service: Orthopedics;  Laterality: Right;    There were no vitals filed for this visit.  Visit Diagnosis:  Abnormality of gait  Falls frequently      Subjective Assessment - 03/03/15 1502    Subjective Reports fall in shower this morning. Was sitting on edge of shower seat, leaned forward and slipped off of seat, landed on bottom. Denies pain, but feels that R hip may be sore tomorrow from fall.   Currently in Pain? No/denies           [facility] Adult PT Treatment/Exercise - 03/03/15 0001    Transfers   Sit to Stand 5: Supervision   Stand to Sit 5: Supervision   Ambulation/Gait   Ambulation/Gait Yes   Ambulation/Gait Assistance 4: Min guard   Ambulation/Gait  Assistance Details Cues for bending L knee/hip to clear L foot from floor.   Ambulation Distance (Feet) 230 Feet   Assistive device Straight cane   Gait Pattern Step-through pattern;Decreased stride length;Decreased hip/knee flexion - left;Decreased hip/knee flexion - right;Shuffle;Decreased trunk rotation;Poor foot clearance - left;Poor foot clearance - right   Ambulation Surface Level;Indoor   Stairs Yes   Stairs Assistance 4: Min guard   Stair Management Technique One rail Right;One rail Left;Alternating pattern;Step to pattern;Forwards  Alternating pattern up/step to pattern down   Number of Stairs 4  x2     Balance Exercises: On red mat surface with counter for support/HHA (Min A for all exercises. Cues for sequencing and proper technique.) - high knees walking x3, heel walking x2, toe walking x1, tandem walking x2 (all performed forwards and backwards) - alt toe taps on cones with side step x2 laps - hurdles x 2 laps         PT Education - 03/03/15 1554    Education provided Yes   Education Details Using non-slip mats in shower to help prevent falls.   Person(s) Educated Patient   Methods Explanation   Comprehension Verbalized understanding          PT Short Term Goals - 02/17/15 1320    PT SHORT TERM  GOAL #1   Title Pt will be independent in HEP to improve strenght, balance, and endurance. Target date: 02/19/15.   Baseline 02/17/15: met today   Status Achieved   PT SHORT TERM GOAL #2   Title Pt will improve BERG score to >/=42/56 to decrease falls risk. Target date: 02/19/15.   Baseline 02/17/15: met today with score 46/56   Status Achieved   PT SHORT TERM GOAL #3   Title Pt will report no falls in the last four weeks to improve safety during functional mobility. Target date: 02/19/15.   Baseline 02/17/15: met today   Status Achieved   PT SHORT TERM GOAL #4   Title Pt will ambulate 300' with LRAD, over even terrain,  with supervison to improve functional mobility.  Target date: 02/19/15.   Baseline 02/17/15: met at rollator level, needs min guard assist at times with straight cane   Status Achieved   PT SHORT TERM GOAL #5   Title Pt will improve gait speed to >/=1.55ft/sec. with LRAD to decrease falls risk. Target date: 02/19/15.   Baseline 02/17/15: met with rollator- 2.06 ft/sec. not met with cane- 1.64 ft/sec   Status Achieved           PT Long Term Goals - 01/22/15 1450    PT LONG TERM GOAL #1   Title Pt will verbalize understanding of fall risk strategies to decrease falls risk. Target date: 03/19/15.   Status New   PT LONG TERM GOAL #2   Title Pt will improve BERG score to >/=46/56 to reduce falls risk. Target date: 03/19/15.   Status New   PT LONG TERM GOAL #3   Title Pt will perform TUG with LRAD in >20 seconds to reduce falls risk. Target date: 03/19/15.   Status New   PT LONG TERM GOAL #4   Title Pt will ambulate 600' over even/uneven terrain with LRAD at MOD I level to improve functional mobility. Target date: 03/19/15.   Status New   PT LONG TERM GOAL #5   Title Pt will traverse 8 steps with one handrail at MOD I level to improve functional mobility. Target date: 03/19/15.   Status New   Additional Long Term Goals   Additional Long Term Goals Yes   PT LONG TERM GOAL #6   Title Pt will be report plans to join fitness center/[employer] upon discharge from PT to maintain gains made during PT. Target date: 03/19/15.   Status New            Plan - 03/03/15 1530    Clinical Impression Statement Pt continues to have difficulty with LLE clearance during gait. Continued to work on balance on compliant surface with SLS activities and incorporating hip and knee flexion. Pt demonstrated LLE clearance during stair training and all balance activities. Pt is progressing toward goals.   Pt will benefit from skilled therapeutic intervention in order to improve on the following deficits Abnormal gait;Decreased endurance;Decreased  balance;Decreased strength;Decreased mobility   Rehab Potential Good   PT Frequency 2x / week   PT Duration 8 weeks   PT Treatment/Interventions ADLs/Self Care Home Management;Gait training;Neuromuscular re-education;Stair training;Biofeedback;Functional mobility training;Patient/family education;Therapeutic activities;Electrical Stimulation;Therapeutic exercise;Manual techniques;DME Instruction;Balance training   PT Next Visit Plan strengthening (specifically ankles and anterior tibialis) and balance activities   Consulted and Agree with Plan of Care Patient        Problem List Patient Active Problem List   Diagnosis Date Noted  . TOBACCO ABUSE 09/06/2007  .  OBSTRUCTIVE SLEEP APNEA 09/06/2007  . COPD 09/06/2007    Rubye Oaks 03/03/2015, 4:02 PM   [place], [place]  Willow Ora, [place], [place] [address] [place], [place] [address] [phone] 03/04/2015, 9:17 AM

## 2015-03-06 ENCOUNTER — Ambulatory Visit: Payer: Medicare Other | Admitting: Physical Therapy

## 2015-03-06 ENCOUNTER — Encounter: Payer: Self-pay | Admitting: Physical Therapy

## 2015-03-06 DIAGNOSIS — R269 Unspecified abnormalities of gait and mobility: Secondary | ICD-10-CM

## 2015-03-06 DIAGNOSIS — R296 Repeated falls: Secondary | ICD-10-CM

## 2015-03-06 NOTE — Therapy (Signed)
[facility] [address] [place], [place], [address] Phone: [phone]   Fax:  [phone]  Physical Therapy Treatment  Patient Details  Name: Linda Morse MRN: [id number] Date of Birth: 10-16-1933 Referring Provider:  Gavin Pound, MD  Encounter Date: 03/06/2015      PT End of Session - 03/06/15 1542    Visit Number 9   Number of Visits 17   Date for PT Re-Evaluation 03/23/15   Authorization Type G-code every 10th visit.   PT Start Time 1400   PT Stop Time 1445   PT Time Calculation (min) 45 min   Equipment Utilized During Treatment Gait belt   Activity Tolerance Patient tolerated treatment well   Behavior During Therapy WFL for tasks assessed/performed      Past Medical History  Diagnosis Date  . Hypercholesterolemia   . Complication of anesthesia     takes 3- 4 days to wake up  . Arthritis   . Sleep apnea     Past Surgical History  Procedure Laterality Date  . Back surgery    . Knee surgery    . Eye surgery Bilateral     catarats  . Abdominal hysterectomy    . Orif wrist fracture Right 06/16/2013    Procedure: OPEN REDUCTION INTERNAL FIXATION (ORIF) RIGHT WRIST FRACTURE ;  Surgeon: Roseanne Kaufman, MD;  Location: [facility];  Service: Orthopedics;  Laterality: Right;    There were no vitals filed for this visit.  Visit Diagnosis:  Abnormality of gait  Falls frequently      Subjective Assessment - 03/06/15 1536    Subjective No new complaints. No falls or pain to report.   Currently in Pain? No/denies           [facility] Adult PT Treatment/Exercise - 03/06/15 1537    Ambulation/Gait   Ambulation/Gait Yes   Ambulation/Gait Assistance 4: Min guard   Ambulation/Gait Assistance Details Cues for bending L hip/knee to clear L foot from floor.   Ambulation Distance (Feet) 230 Feet   Assistive device Straight cane   Gait Pattern Step-through pattern;Decreased stride length;Decreased hip/knee  flexion - right;Decreased hip/knee flexion - left;Poor foot clearance - left   Ambulation Surface Level;Indoor     Therapeutic Exercise (cues for technique and ex form): -Seated self-stretch with belt for ankle plantarflexors 3x30 sec each side -Seated dorsiflexion with red Thera-band 3x10 each side -Hook lying hip flexion off/onto mat with knee flexion 3x10 each side -SLR with 2 lb cuff weight around ankle 2x5 each side  Neuro Re-Ed (min assist for balance; cues for sequencing and technique): -On foam beam in parallel bars (to promote use of ankle strategy): DLS without UE support 2x30 sec; alt toe taps to front 3x10 each side with frequent use of B UE for support (inc ant sway requiring min assist to correct)         PT Short Term Goals - 02/17/15 1320    PT SHORT TERM GOAL #1   Title Pt will be independent in HEP to improve strenght, balance, and endurance. Target date: 02/19/15.   Baseline 02/17/15: met today   Status Achieved   PT SHORT TERM GOAL #2   Title Pt will improve BERG score to >/=42/56 to decrease falls risk. Target date: 02/19/15.   Baseline 02/17/15: met today with score 46/56   Status Achieved   PT SHORT TERM GOAL #3   Title Pt will report no falls in the last four weeks to  improve safety during functional mobility. Target date: 02/19/15.   Baseline 02/17/15: met today   Status Achieved   PT SHORT TERM GOAL #4   Title Pt will ambulate 300' with LRAD, over even terrain,  with supervison to improve functional mobility. Target date: 02/19/15.   Baseline 02/17/15: met at rollator level, needs min guard assist at times with straight cane   Status Achieved   PT SHORT TERM GOAL #5   Title Pt will improve gait speed to >/=1.74ft/sec. with LRAD to decrease falls risk. Target date: 02/19/15.   Baseline 02/17/15: met with rollator- 2.06 ft/sec. not met with cane- 1.64 ft/sec   Status Achieved           PT Long Term Goals - 01/22/15 1450    PT LONG TERM GOAL #1   Title Pt  will verbalize understanding of fall risk strategies to decrease falls risk. Target date: 03/19/15.   Status New   PT LONG TERM GOAL #2   Title Pt will improve BERG score to >/=46/56 to reduce falls risk. Target date: 03/19/15.   Status New   PT LONG TERM GOAL #3   Title Pt will perform TUG with LRAD in >20 seconds to reduce falls risk. Target date: 03/19/15.   Status New   PT LONG TERM GOAL #4   Title Pt will ambulate 600' over even/uneven terrain with LRAD at MOD I level to improve functional mobility. Target date: 03/19/15.   Status New   PT LONG TERM GOAL #5   Title Pt will traverse 8 steps with one handrail at MOD I level to improve functional mobility. Target date: 03/19/15.   Status New   Additional Long Term Goals   Additional Long Term Goals Yes   PT LONG TERM GOAL #6   Title Pt will be report plans to join fitness center/[employer] upon discharge from PT to maintain gains made during PT. Target date: 03/19/15.   Status New            Plan - 03/06/15 1543    Clinical Impression Statement Pt continues to have difficulty with LLE clearance during gait. Decreased strength of L ankle dorsiflexors apparent with movement against resistance. Pt challenged by static balance activities on compliant surface. Pt is progressing toward goals.   Pt will benefit from skilled therapeutic intervention in order to improve on the following deficits Abnormal gait;Decreased endurance;Decreased balance;Decreased strength;Decreased mobility   Rehab Potential Good   PT Frequency 2x / week   PT Duration 8 weeks   PT Treatment/Interventions ADLs/Self Care Home Management;Gait training;Neuromuscular re-education;Stair training;Biofeedback;Functional mobility training;Patient/family education;Therapeutic activities;Electrical Stimulation;Therapeutic exercise;Manual techniques;DME Instruction;Balance training   PT Next Visit Plan strengthening (specifically ankles and anterior tibialis) and balance  activities;G-code next visit   Consulted and Agree with Plan of Care Patient        Problem List Patient Active Problem List   Diagnosis Date Noted  . TOBACCO ABUSE 09/06/2007  . OBSTRUCTIVE SLEEP APNEA 09/06/2007  . COPD 09/06/2007   [place], [place], [place] 03/06/2015, 3:52 PM  Willow Ora, PTA, [place] [address] [place], [place] [address] [phone] 03/06/2015, 8:49 PM

## 2015-03-11 ENCOUNTER — Ambulatory Visit: Payer: Medicare Other

## 2015-03-17 ENCOUNTER — Encounter: Payer: Self-pay | Admitting: Physical Therapy

## 2015-03-17 ENCOUNTER — Ambulatory Visit: Payer: Medicare Other | Attending: Family Medicine | Admitting: Physical Therapy

## 2015-03-17 DIAGNOSIS — R296 Repeated falls: Secondary | ICD-10-CM | POA: Insufficient documentation

## 2015-03-17 DIAGNOSIS — R269 Unspecified abnormalities of gait and mobility: Secondary | ICD-10-CM

## 2015-03-17 NOTE — Therapy (Addendum)
[facility] [address] [place], [place], [address] Phone: [phone]   Fax:  [phone]  Physical Therapy Treatment  Patient Details  Name: Linda Morse MRN: [id number] Date of Birth: Jun 05, 1934 Referring Provider:  Gavin Pound, MD  Encounter Date: 03/17/2015      PT End of Session - 03/17/15 1511    Visit Number 10   Number of Visits 17   Date for PT Re-Evaluation 03/23/15   Authorization Type G-code every 10th visit.   PT Start Time 1402   PT Stop Time 1445   PT Time Calculation (min) 43 min   Equipment Utilized During Treatment Gait belt   Activity Tolerance Patient tolerated treatment well   Behavior During Therapy WFL for tasks assessed/performed      Past Medical History  Diagnosis Date  . Hypercholesterolemia   . Complication of anesthesia     takes 3- 4 days to wake up  . Arthritis   . Sleep apnea     Past Surgical History  Procedure Laterality Date  . Back surgery    . Knee surgery    . Eye surgery Bilateral     catarats  . Abdominal hysterectomy    . Orif wrist fracture Right 06/16/2013    Procedure: OPEN REDUCTION INTERNAL FIXATION (ORIF) RIGHT WRIST FRACTURE ;  Surgeon: Roseanne Kaufman, MD;  Location: [facility];  Service: Orthopedics;  Laterality: Right;    There were no vitals filed for this visit.  Visit Diagnosis:  Abnormality of gait  Falls frequently      Subjective Assessment - 03/17/15 1509    Subjective No new complaints. No falls or pain to report.   Pertinent History COPD, current smoker   Patient Stated Goals walk better and not fall, feel more confident when walking   Currently in Pain? No/denies           [facility] Adult PT Treatment/Exercise - 03/17/15 1406    Ambulation/Gait   Gait velocity 26.69 sec = 1.23 ft/sec with straight cane   Berg Balance Test   Sit to Stand Able to stand without using hands and stabilize independently   Standing Unsupported Able  to stand safely 2 minutes   Sitting with Back Unsupported but Feet Supported on Floor or Stool Able to sit safely and securely 2 minutes   Stand to Sit Sits safely with minimal use of hands   Transfers Able to transfer safely, minor use of hands   Standing Unsupported with Eyes Closed Able to stand 10 seconds safely   Standing Ubsupported with Feet Together Able to place feet together independently and stand 1 minute safely   From Standing, Reach Forward with Outstretched Arm Can reach forward >12 cm safely (5")  8"   From Standing Position, Pick up Object from Floor Able to pick up shoe, needs supervision   From Standing Position, Turn to Look Behind Over each Shoulder Looks behind from both sides and weight shifts well   Turn 360 Degrees Able to turn 360 degrees safely but slowly   Standing Unsupported, Alternately Place Feet on Step/Stool Able to stand independently and complete 8 steps >20 seconds   Standing Unsupported, One Foot in Front Able to plae foot ahead of the other independently and hold 30 seconds   Standing on One Leg Tries to lift leg/unable to hold 3 seconds but remains standing independently   Total Score 47   Timed Up and Go Test   TUG Normal TUG  Normal TUG (seconds) 26.81      Neuro Re-Ed (min guard for safety; occasional UE support needed): -On level surface in parallel bars: standing alt single toe taps with cones 2x10; standing alt double toe taps with cones 2x10 -On foam beam in parallel bars to promote ankle strategy: DLS with eyes open 30 sec; DLS with eyes closed 3x15 sec (significant anterior sway noted with first attempt requiring min assist to correct; with verbal/tactile cues, pt able to utilize ankle strategy to correct ant/post sway)         PT Short Term Goals - 02/17/15 1320    PT SHORT TERM GOAL #1   Title Pt will be independent in HEP to improve strenght, balance, and endurance. Target date: 02/19/15.   Baseline 02/17/15: met today   Status  Achieved   PT SHORT TERM GOAL #2   Title Pt will improve BERG score to >/=42/56 to decrease falls risk. Target date: 02/19/15.   Baseline 02/17/15: met today with score 46/56   Status Achieved   PT SHORT TERM GOAL #3   Title Pt will report no falls in the last four weeks to improve safety during functional mobility. Target date: 02/19/15.   Baseline 02/17/15: met today   Status Achieved   PT SHORT TERM GOAL #4   Title Pt will ambulate 300' with LRAD, over even terrain,  with supervison to improve functional mobility. Target date: 02/19/15.   Baseline 02/17/15: met at rollator level, needs min guard assist at times with straight cane   Status Achieved   PT SHORT TERM GOAL #5   Title Pt will improve gait speed to >/=1.97ft/sec. with LRAD to decrease falls risk. Target date: 02/19/15.   Baseline 02/17/15: met with rollator- 2.06 ft/sec. not met with cane- 1.64 ft/sec   Status Achieved           PT Long Term Goals - 03/17/15 1513    PT LONG TERM GOAL #1   Title Pt will verbalize understanding of fall risk strategies to decrease falls risk. Target date: 03/19/15.   Status New   PT LONG TERM GOAL #2   Title Pt will improve BERG score to >/=46/56 to reduce falls risk. Target date: 03/19/15.   Baseline Met 03/17/15: scored 47/56   Status Achieved   PT LONG TERM GOAL #3   Title Pt will perform TUG with LRAD in >20 seconds to reduce falls risk. Target date: 03/19/15.   Status New   PT LONG TERM GOAL #4   Title Pt will ambulate 600' over even/uneven terrain with LRAD at MOD I level to improve functional mobility. Target date: 03/19/15.   Status New   PT LONG TERM GOAL #5   Title Pt will traverse 8 steps with one handrail at MOD I level to improve functional mobility. Target date: 03/19/15.   Status New   PT LONG TERM GOAL #6   Title Pt will be report plans to join fitness center/[employer] upon discharge from PT to maintain gains made during PT. Target date: 03/19/15.   Status New            Plan - 03/17/15 1514    Clinical Impression Statement Pt has improved Berg Balance Assessment score to 47/56 and improved TUG time to 26.81 sec. Gait speed improved from 1.16 ft/sec (miscalculation on last assessment of 1.64 ft/sec) to 1.23 ft/sec. Pt has met LTG #2 with Berg score >46/56 and making steady progress toward other goals.    Pt  will benefit from skilled therapeutic intervention in order to improve on the following deficits Abnormal gait;Decreased endurance;Decreased balance;Decreased strength;Decreased mobility   Rehab Potential Good   PT Frequency 2x / week   PT Duration 8 weeks   PT Treatment/Interventions ADLs/Self Care Home Management;Gait training;Neuromuscular re-education;Stair training;Biofeedback;Functional mobility training;Patient/family education;Therapeutic activities;Electrical Stimulation;Therapeutic exercise;Manual techniques;DME Instruction;Balance training   PT Next Visit Plan strengthening (specifically ankles and anterior tibialis) and balance activities   Consulted and Agree with Plan of Care Patient        Problem List Patient Active Problem List   Diagnosis Date Noted  . TOBACCO ABUSE 09/06/2007  . OBSTRUCTIVE SLEEP APNEA 09/06/2007  . COPD 09/06/2007    Rubye Oaks 03/17/2015, 3:28 PM  [place], [place], [place], [place] [address] [place], [place] [address] [phone] 03/19/2015, 8:16 AM   Physical Therapy Progress Note  Dates of Reporting Period: 01/22/15 to 03/17/15  Objective Reports of Subjective Statement:  Pt is able to ambulate longer distances, with less gait deviations and less assist.  Objective Measurements: BERG: 47/56; TUG with SPC: 1.46ft/sec  Goal Update:      PT Short Term Goals - 02/17/15 1320    PT SHORT TERM GOAL #1   Title Pt will be independent in HEP to improve strenght, balance, and endurance. Target date: 02/19/15.   Baseline 02/17/15: met today   Status  Achieved   PT SHORT TERM GOAL #2   Title Pt will improve BERG score to >/=42/56 to decrease falls risk. Target date: 02/19/15.   Baseline 02/17/15: met today with score 46/56   Status Achieved   PT SHORT TERM GOAL #3   Title Pt will report no falls in the last four weeks to improve safety during functional mobility. Target date: 02/19/15.   Baseline 02/17/15: met today   Status Achieved   PT SHORT TERM GOAL #4   Title Pt will ambulate 300' with LRAD, over even terrain,  with supervison to improve functional mobility. Target date: 02/19/15.   Baseline 02/17/15: met at rollator level, needs min guard assist at times with straight cane   Status Achieved   PT SHORT TERM GOAL #5   Title Pt will improve gait speed to >/=1.59ft/sec. with LRAD to decrease falls risk. Target date: 02/19/15.   Baseline 02/17/15: met with rollator- 2.06 ft/sec. not met with cane- 1.64 ft/sec   Status Achieved         PT Long Term Goals - 03/17/15 1513    PT LONG TERM GOAL #1   Title Pt will verbalize understanding of fall risk strategies to decrease falls risk. Target date: 03/19/15.   Status New   PT LONG TERM GOAL #2   Title Pt will improve BERG score to >/=46/56 to reduce falls risk. Target date: 03/19/15.   Baseline Met 03/17/15: scored 47/56   Status Achieved   PT LONG TERM GOAL #3   Title Pt will perform TUG with LRAD in >20 seconds to reduce falls risk. Target date: 03/19/15.   Status New   PT LONG TERM GOAL #4   Title Pt will ambulate 600' over even/uneven terrain with LRAD at MOD I level to improve functional mobility. Target date: 03/19/15.   Status New   PT LONG TERM GOAL #5   Title Pt will traverse 8 steps with one handrail at MOD I level to improve functional mobility. Target date: 03/19/15.   Status New   PT LONG TERM GOAL #6  Title Pt will be report plans to join fitness center/[employer] upon discharge from PT to maintain gains made during PT. Target date: 03/19/15.   Status New       Plan:  See note above.  Reason Skilled Services are Required: Pt continues to require assist during ambulation with SPC and during high level balance activities and to improve strength.  Geoffry Paradise, PT,DPT 03/20/2015 9:12 AM Phone: [phone] Fax: [phone]

## 2015-03-19 ENCOUNTER — Encounter: Payer: Self-pay | Admitting: Physical Therapy

## 2015-03-19 ENCOUNTER — Ambulatory Visit: Payer: Medicare Other | Admitting: Physical Therapy

## 2015-03-19 DIAGNOSIS — R296 Repeated falls: Secondary | ICD-10-CM

## 2015-03-19 DIAGNOSIS — R269 Unspecified abnormalities of gait and mobility: Secondary | ICD-10-CM

## 2015-03-19 NOTE — Therapy (Signed)
[facility] [address] [place], [place], [address] Phone: [phone]   Fax:  [phone]  Physical Therapy Treatment  Patient Details  Name: Linda Morse MRN: [id number] Date of Birth: [birth year] Referring Provider:  Gavin Pound, MD  Encounter Date: 03/19/2015      PT End of Session - 03/19/15 1405    Visit Number 11   Number of Visits 17   Date for PT Re-Evaluation 03/23/15   Authorization Type G-code every 10th visit.   PT Start Time 1403   PT Stop Time 1447   PT Time Calculation (min) 44 min   Equipment Utilized During Treatment Gait belt   Activity Tolerance Patient tolerated treatment well   Behavior During Therapy WFL for tasks assessed/performed      Past Medical History  Diagnosis Date  . Hypercholesterolemia   . Complication of anesthesia     takes 3- 4 days to wake up  . Arthritis   . Sleep apnea     Past Surgical History  Procedure Laterality Date  . Back surgery    . Knee surgery    . Eye surgery Bilateral     catarats  . Abdominal hysterectomy    . Orif wrist fracture Right 06/16/2013    Procedure: OPEN REDUCTION INTERNAL FIXATION (ORIF) RIGHT WRIST FRACTURE ;  Surgeon: Roseanne Kaufman, MD;  Location: [facility];  Service: Orthopedics;  Laterality: Right;    There were no vitals filed for this visit.  Visit Diagnosis:  Abnormality of gait  Falls frequently      Subjective Assessment - 03/19/15 1405    Subjective No new complaints. No falls or pain to report.   Pertinent History COPD, current smoker   Patient Stated Goals walk better and not fall, feel more confident when walking   Currently in Pain? No/denies          [facility] Adult PT Treatment/Exercise - 03/19/15 1408    Ambulation/Gait   Ambulation/Gait Yes   Ambulation/Gait Assistance 4: Min guard;4: Min assist   Ambulation/Gait Assistance Details cues for L foot clearance and posture; LOB x4 due to L foot drag   Ambulation Distance (Feet) 500 Feet   Assistive device Straight cane   Gait Pattern Step-through pattern;Decreased stride length;Decreased hip/knee flexion - right;Decreased hip/knee flexion - left;Poor foot clearance - left   Ambulation Surface Level;Unlevel;Indoor;Outdoor;Paved;Gravel;Grass     Therapeutic Exercise (supervision for safety; cues for ex form and technique): performed new HEP (see pt instructions)           PT Education - 03/19/15 1507    Education provided Yes   Education Details New HEP focused on strengthening/stretching B LE   Person(s) Educated Patient   Methods Explanation;Demonstration;Verbal cues;Handout   Comprehension Verbalized understanding;Returned demonstration;Verbal cues required          PT Short Term Goals - 02/17/15 1320    PT SHORT TERM GOAL #1   Title Pt will be independent in HEP to improve strenght, balance, and endurance. Target date: 02/19/15.   Baseline 02/17/15: met today   Status Achieved   PT SHORT TERM GOAL #2   Title Pt will improve BERG score to >/=42/56 to decrease falls risk. Target date: 02/19/15.   Baseline 02/17/15: met today with score 46/56   Status Achieved   PT SHORT TERM GOAL #3   Title Pt will report no falls in the last four weeks to improve safety during functional mobility. Target date: 02/19/15.   Baseline  02/17/15: met today   Status Achieved   PT SHORT TERM GOAL #4   Title Pt will ambulate 300' with LRAD, over even terrain,  with supervison to improve functional mobility. Target date: 02/19/15.   Baseline 02/17/15: met at rollator level, needs min guard assist at times with straight cane   Status Achieved   PT SHORT TERM GOAL #5   Title Pt will improve gait speed to >/=1.52ft/sec. with LRAD to decrease falls risk. Target date: 02/19/15.   Baseline 02/17/15: met with rollator- 2.06 ft/sec. not met with cane- 1.64 ft/sec   Status Achieved           PT Long Term Goals - 03/17/15 1513    PT LONG TERM GOAL #1    Title Pt will verbalize understanding of fall risk strategies to decrease falls risk. Target date: 03/19/15.   Status New   PT LONG TERM GOAL #2   Title Pt will improve BERG score to >/=46/56 to reduce falls risk. Target date: 03/19/15.   Baseline Met 03/17/15: scored 47/56   Status Achieved   PT LONG TERM GOAL #3   Title Pt will perform TUG with LRAD in >20 seconds to reduce falls risk. Target date: 03/19/15.   Status New   PT LONG TERM GOAL #4   Title Pt will ambulate 600' over even/uneven terrain with LRAD at MOD I level to improve functional mobility. Target date: 03/19/15.   Status New   PT LONG TERM GOAL #5   Title Pt will traverse 8 steps with one handrail at MOD I level to improve functional mobility. Target date: 03/19/15.   Status New   PT LONG TERM GOAL #6   Title Pt will be report plans to join fitness center/[employer] upon discharge from PT to maintain gains made during PT. Target date: 03/19/15.   Status New           Plan - 03/19/15 1511    Clinical Impression Statement Pt demonstrated significant difficulty utilizing hip strategy to correct LOB with trunk flexion/anterior sway, especially on uneven surfaces. Left foot drop became more apparent with fatigue, causing foward LOB and requiring min to mod assist to correct. Pt also wearing poor fitting footwear that could have contributed to LOB. Encouraged pt to wear tennis shoes, especially when walking longer distances away from home. Pt continues to report lack of compliance with original HEP, so provided pt with a new HEP with fewer exercises. Plan to assess LTGs next session.   Pt will benefit from skilled therapeutic intervention in order to improve on the following deficits Abnormal gait;Decreased endurance;Decreased balance;Decreased strength;Decreased mobility   Rehab Potential Good   PT Frequency 2x / week   PT Duration 8 weeks   PT Treatment/Interventions ADLs/Self Care Home Management;Gait training;Neuromuscular  re-education;Stair training;Biofeedback;Functional mobility training;Patient/family education;Therapeutic activities;Electrical Stimulation;Therapeutic exercise;Manual techniques;DME Instruction;Balance training   PT Next Visit Plan Assess LTGs; strengthening (specifically ankles and anterior tibialis) and balance activities   Consulted and Agree with Plan of Care Patient        Problem List Patient Active Problem List   Diagnosis Date Noted  . TOBACCO ABUSE 09/06/2007  . OBSTRUCTIVE SLEEP APNEA 09/06/2007  . COPD 09/06/2007    Rubye Oaks 03/19/2015, 3:20 PM  Rubye Oaks, [facility] [address] [place], [place], [address] Phone: [phone]   Fax:  [phone]

## 2015-03-19 NOTE — Patient Instructions (Signed)
Quad Strength: Quarter Squat   With feet shoulder-width apart and back against wall, slide down wall until knees are at 30-45. Return. Repeat __10__ times or for _2___ . Do __2__ sessions per day. CAUTION: You should not bend knees deep enough to cause pain.  http://cc.exer.us/10     Copyright  VHI. All rights reserved.  ANKLE: Dorsiflexion - Standing   Stand with feet shoulder width apart and back supported against wall. Raise toes of both feet up at same time. Repeat _10__ reps for _2__ sets 2 times per day. Hold onto a support if needed.  Copyright  VHI. All rights reserved.  Achilles Tendon Stretch   Stand with hands supported on wall, elbows slightly bent, feet parallel and both heels on floor, front knee bent, back knee straight. Slowly relax back knee until a stretch is felt in back of leg. Hold _30___ seconds. Repeat with leg positions switched. Repeat both sides twice. Do 2 times per day.  Copyright  VHI. All rights reserved.

## 2015-03-20 NOTE — Addendum Note (Signed)
Addended by: Elza Rafter on: 03/20/2015 09:15 AM   Modules accepted: Orders

## 2015-03-24 ENCOUNTER — Ambulatory Visit: Payer: Medicare Other | Admitting: Physical Therapy

## 2015-03-24 ENCOUNTER — Encounter: Payer: Self-pay | Admitting: Physical Therapy

## 2015-03-24 DIAGNOSIS — R296 Repeated falls: Secondary | ICD-10-CM

## 2015-03-24 DIAGNOSIS — R269 Unspecified abnormalities of gait and mobility: Secondary | ICD-10-CM

## 2015-03-24 NOTE — Patient Instructions (Signed)
Fall Prevention and Home Safety Falls cause injuries and can affect all age groups. It is possible to use preventive measures to significantly decrease the likelihood of falls. There are many simple measures which can make your home safer and prevent falls. OUTDOORS  Repair cracks and edges of walkways and driveways.  Remove high doorway thresholds.  Trim shrubbery on the main path into your home.  Have good outside lighting.  Clear walkways of tools, rocks, debris, and clutter.  Check that handrails are not broken and are securely fastened. Both sides of steps should have handrails.  Have leaves, snow, and ice cleared regularly.  Use sand or salt on walkways during winter months.  In the garage, clean up grease or oil spills. BATHROOM  Install night lights.  Install grab bars by the toilet and in the tub and shower.  Use non-skid mats or decals in the tub or shower.  Place a plastic non-slip stool in the shower to sit on, if needed.  Keep floors dry and clean up all water on the floor immediately.  Remove soap buildup in the tub or shower on a regular basis.  Secure bath mats with non-slip, double-sided rug tape.  Remove throw rugs and tripping hazards from the floors. BEDROOMS  Install night lights.  Make sure a bedside light is easy to reach.  Do not use oversized bedding.  Keep a telephone by your bedside.  Have a firm chair with side arms to use for getting dressed.  Remove throw rugs and tripping hazards from the floor. KITCHEN  Keep handles on pots and pans turned toward the center of the stove. Use back burners when possible.  Clean up spills quickly and allow time for drying.  Avoid walking on wet floors.  Avoid hot utensils and knives.  Position shelves so they are not too high or low.  Place commonly used objects within easy reach.  If necessary, use a sturdy step stool with a grab bar when reaching.  Keep electrical cables out of the  way.  Do not use floor polish or wax that makes floors slippery. If you must use wax, use non-skid floor wax.  Remove throw rugs and tripping hazards from the floor. STAIRWAYS  Never leave objects on stairs.  Place handrails on both sides of stairways and use them. Fix any loose handrails. Make sure handrails on both sides of the stairways are as long as the stairs.  Check carpeting to make sure it is firmly attached along stairs. Make repairs to worn or loose carpet promptly.  Avoid placing throw rugs at the top or bottom of stairways, or properly secure the rug with carpet tape to prevent slippage. Get rid of throw rugs, if possible.  Have an electrician put in a light switch at the top and bottom of the stairs. OTHER FALL PREVENTION TIPS  Wear low-heel or rubber-soled shoes that are supportive and fit well. Wear closed toe shoes.  When using a stepladder, make sure it is fully opened and both spreaders are firmly locked. Do not climb a closed stepladder.  Add color or contrast paint or tape to grab bars and handrails in your home. Place contrasting color strips on first and last steps.  Learn and use mobility aids as needed. Install an electrical emergency response system.  Turn on lights to avoid dark areas. Replace light bulbs that burn out immediately. Get light switches that glow.  Arrange furniture to create clear pathways. Keep furniture in the same place.    Firmly attach carpet with non-skid or double-sided tape.  Eliminate uneven floor surfaces.  Select a carpet pattern that does not visually hide the edge of steps.  Be aware of all pets. OTHER HOME SAFETY TIPS  Set the water temperature for 120 F (48.8 C).  Keep emergency numbers on or near the telephone.  Keep smoke detectors on every level of the home and near sleeping areas. Document Released: 08/13/2002 Document Revised: 02/22/2012 Document Reviewed: 11/12/2011 [facility] Patient Information 2015  [place], [place]. This information is not intended to replace advice given to you by your health care provider. Make sure you discuss any questions you have with your health care provider. Fall Prevention and Home Safety Falls cause injuries and can affect all age groups. It is possible to prevent falls.  HOW TO PREVENT FALLS  Wear shoes with rubber soles that do not have an opening for your toes.  Keep the inside and outside of your house well lit.  Use night lights throughout your home.  Remove clutter from floors.  Clean up floor spills.  Remove throw rugs or fasten them to the floor with carpet tape.  Do not place electrical cords across pathways.  Put grab bars by your tub, shower, and toilet. Do not use towel bars as grab bars.  Put handrails on both sides of the stairway. Fix loose handrails.  Do not climb on stools or stepladders, if possible.  Do not wax your floors.  Repair uneven or unsafe sidewalks, walkways, or stairs.  Keep items you use a lot within reach.  Be aware of pets.  Keep emergency numbers next to the telephone.  Put smoke detectors in your home and near bedrooms. Ask your doctor what other things you can do to prevent falls. Document Released: 06/19/2009 Document Revised: 02/22/2012 Document Reviewed: 11/23/2011 [facility] Patient Information 2015 [place], [place]. This information is not intended to replace advice given to you by your health care provider. Make sure you discuss any questions you have with your health care provider.

## 2015-03-24 NOTE — Therapy (Signed)
[facility] [address] [place], [place], [address] Phone: [phone]   Fax:  [phone]  Physical Therapy Treatment  Patient Details  Name: Linda Morse MRN: [id number] Date of Birth: [birth year] Referring Provider:  Gavin Pound, MD  Encounter Date: 03/24/2015      PT End of Session - 03/24/15 1408    Visit Number 12   Number of Visits 17   Date for PT Re-Evaluation 03/23/15   Authorization Type G-code every 10th visit.   PT Start Time 1406   PT Stop Time 1445   PT Time Calculation (min) 39 min   Equipment Utilized During Treatment Gait belt   Activity Tolerance Patient tolerated treatment well   Behavior During Therapy WFL for tasks assessed/performed      Past Medical History  Diagnosis Date  . Hypercholesterolemia   . Complication of anesthesia     takes 3- 4 days to wake up  . Arthritis   . Sleep apnea     Past Surgical History  Procedure Laterality Date  . Back surgery    . Knee surgery    . Eye surgery Bilateral     catarats  . Abdominal hysterectomy    . Orif wrist fracture Right 06/16/2013    Procedure: OPEN REDUCTION INTERNAL FIXATION (ORIF) RIGHT WRIST FRACTURE ;  Surgeon: Roseanne Kaufman, MD;  Location: [facility];  Service: Orthopedics;  Laterality: Right;    There were no vitals filed for this visit.  Visit Diagnosis:  Abnormality of gait  Falls frequently      Subjective Assessment - 03/24/15 1409    Subjective No new complaints. No falls or pain to report.   Pertinent History COPD, current smoker   Patient Stated Goals walk better and not fall, feel more confident when walking   Currently in Pain? No/denies           [facility] Adult PT Treatment/Exercise - 03/24/15 1500    Ambulation/Gait   Ambulation/Gait Yes   Ambulation/Gait Assistance 4: Min guard;4: Min assist   Ambulation/Gait Assistance Details cues for L hip/knee flexion for L foot clearance and walker  positioning; L foot drag continuing to cause frequent stumble and anterior LOB   Ambulation Distance (Feet) 615 Feet  500' outdoors, 115' indoors   Assistive device 4-wheeled walker   Gait Pattern Step-through pattern;Decreased stride length;Decreased hip/knee flexion - right;Decreased hip/knee flexion - left;Poor foot clearance - left   Ambulation Surface Level;Unlevel;Indoor;Outdoor;Paved;Gravel;Grass   Stairs Yes   Stairs Assistance 5: Supervision   Stair Management Technique One rail Right;One rail Left;Alternating pattern;Forwards   Number of Stairs 8   Timed Up and Go Test   TUG Normal TUG   Normal TUG (seconds) 18.19  with SPC; 15.02 sec with rollator     Self Care Educated pt on fall prevention strategies for the home. Provided written instructions. Discussed community fitness options as well. Pt to look into [place].        PT Education - 03/24/15 1508    Education provided Yes   Education Details Fall prevention strategies; [employer] scholarship program for joining fitness center after d/c from PT   Person(s) Educated Patient   Methods Explanation;Handout   Comprehension Verbalized understanding;Returned demonstration          PT Short Term Goals - 02/17/15 1320    PT SHORT TERM GOAL #1   Title Pt will be independent in HEP to improve strenght, balance, and endurance.  Target date: 02/19/15.   Baseline 02/17/15: met today   Status Achieved   PT SHORT TERM GOAL #2   Title Pt will improve BERG score to >/=42/56 to decrease falls risk. Target date: 02/19/15.   Baseline 02/17/15: met today with score 46/56   Status Achieved   PT SHORT TERM GOAL #3   Title Pt will report no falls in the last four weeks to improve safety during functional mobility. Target date: 02/19/15.   Baseline 02/17/15: met today   Status Achieved   PT SHORT TERM GOAL #4   Title Pt will ambulate 300' with LRAD, over even terrain,  with supervison to improve functional mobility.  Target date: 02/19/15.   Baseline 02/17/15: met at rollator level, needs min guard assist at times with straight cane   Status Achieved   PT SHORT TERM GOAL #5   Title Pt will improve gait speed to >/=1.80ft/sec. with LRAD to decrease falls risk. Target date: 02/19/15.   Baseline 02/17/15: met with rollator- 2.06 ft/sec. not met with cane- 1.64 ft/sec   Status Achieved           PT Long Term Goals - 03/24/15 1511    PT LONG TERM GOAL #1   Title Pt will verbalize understanding of fall risk strategies to decrease falls risk. Target date: 03/19/15.   Baseline Met 03/24/15   Status Achieved   PT LONG TERM GOAL #2   Title Pt will improve BERG score to >/=46/56 to reduce falls risk. Target date: 03/19/15.   Baseline Met 03/17/15: scored 47/56   Status Achieved   PT LONG TERM GOAL #3   Title Pt will perform TUG with LRAD in <20 seconds to reduce falls risk. Target date: 03/19/15.   Baseline Met 03/24/15: 18.19 sec with SPC, 15.02 sec with rollator   Status Achieved   PT LONG TERM GOAL #4   Title Pt will ambulate 600' over even/uneven terrain with LRAD at MOD I level to improve functional mobility. Target date: 03/19/15.   Baseline Partially met 03/24/15: Pt able to ambulate 600' over even/uneven terrain with rollator, but required min guard for safety.   Status Partially Met   PT LONG TERM GOAL #5   Title Pt will traverse 8 steps with one handrail at MOD I level to improve functional mobility. Target date: 03/19/15.   Baseline 03/24/15: Able to traverse 8 steps with one handrail, but required supervision for safety.   Status On-going   PT LONG TERM GOAL #6   Title Pt will be report plans to join fitness center/[employer] upon discharge from PT to maintain gains made during PT. Target date: 03/19/15.   Baseline Met 03/24/15: Pt reported plans to join [employer] through scholarship program or go to [facility] upon d/c from PT.   Status Achieved               Plan - 03/24/15 1526    Clinical  Impression Statement Pt continues to present with significant left foot drop causing frequent missteps and anterior LOB requiring min to mod assist to correct when using SPC. Pt educated on fall prevention techniques and use of rollator versus SPC to increase safety with ambulation. Met LTGs 1, 3, and 5, and partially met LTGs 4 and 5. Plan to assess goals not met and discuss d/c plans with PT next session.   Pt will benefit from skilled therapeutic intervention in order to improve on the following deficits Abnormal gait;Decreased endurance;Decreased balance;Decreased strength;Decreased mobility  Rehab Potential Good   PT Frequency 2x / week   PT Duration 8 weeks   PT Treatment/Interventions ADLs/Self Care Home Management;Gait training;Neuromuscular re-education;Stair training;Biofeedback;Functional mobility training;Patient/family education;Therapeutic activities;Electrical Stimulation;Therapeutic exercise;Manual techniques;DME Instruction;Balance training   PT Next Visit Plan Discharge vs renew pending progress toward remaining LTG"S. Primary PT see's pt next for this assessment. Pt aware next visit may be her discharge visit.   Consulted and Agree with Plan of Care Patient        Problem List Patient Active Problem List   Diagnosis Date Noted  . TOBACCO ABUSE 09/06/2007  . OBSTRUCTIVE SLEEP APNEA 09/06/2007  . COPD 09/06/2007    Rubye Oaks 03/24/2015, 4:39 PM  [place], [place], [place], [facility] [address], [place], [place] [address] [phone] 03/25/2015, 12:54 PM

## 2015-03-26 ENCOUNTER — Encounter: Payer: Medicare Other | Admitting: Physical Therapy

## 2015-03-28 ENCOUNTER — Ambulatory Visit: Payer: Medicare Other

## 2015-03-28 DIAGNOSIS — R269 Unspecified abnormalities of gait and mobility: Secondary | ICD-10-CM

## 2015-03-28 DIAGNOSIS — R296 Repeated falls: Secondary | ICD-10-CM

## 2015-03-28 NOTE — Therapy (Signed)
[facility] [address] [place], [place], [address] Phone: [phone]   Fax:  [phone]  Physical Therapy Treatment  Patient Details  Name: Linda Morse MRN: [id number] Date of Birth: 02-07-1934 Referring Provider:  Gavin Pound, MD  Encounter Date: 03/28/2015      PT End of Session - 03/28/15 1440    Visit Number 13   Number of Visits 17   Date for PT Re-Evaluation 04/21/15   Authorization Type G-code every 10th visit.   PT Start Time 1402   PT Stop Time 1433   PT Time Calculation (min) 31 min   Equipment Utilized During Treatment Gait belt   Activity Tolerance Patient tolerated treatment well   Behavior During Therapy WFL for tasks assessed/performed      Past Medical History  Diagnosis Date  . Hypercholesterolemia   . Complication of anesthesia     takes 3- 4 days to wake up  . Arthritis   . Sleep apnea     Past Surgical History  Procedure Laterality Date  . Back surgery    . Knee surgery    . Eye surgery Bilateral     catarats  . Abdominal hysterectomy    . Orif wrist fracture Right 06/16/2013    Procedure: OPEN REDUCTION INTERNAL FIXATION (ORIF) RIGHT WRIST FRACTURE ;  Surgeon: Roseanne Kaufman, MD;  Location: [facility];  Service: Orthopedics;  Laterality: Right;    There were no vitals filed for this visit.  Visit Diagnosis:  Abnormality of gait  Falls frequently      Subjective Assessment - 03/28/15 1406    Subjective Pt denied falls or changes since last visit.    Pertinent History COPD, current smoker   Patient Stated Goals walk better and not fall, feel more confident when walking   Currently in Pain? No/denies                         [facility] Adult PT Treatment/Exercise - 03/28/15 1410    Ambulation/Gait   Ambulation/Gait Yes   Ambulation/Gait Assistance 5: Supervision;6: Modified independent (Device/Increase time)   Ambulation/Gait Assistance Details Cues to  decrease forward momentum when traversing decline outside. Supervision while on decline outside in grass, otherwise pt was MOD I with rollator.   Ambulation Distance (Feet) --  200', 75', 600' all with SPC and rollator   Assistive device Rollator;Straight cane   Gait Pattern Step-through pattern;Decreased stride length;Decreased hip/knee flexion - left;Decreased hip/knee flexion - right;Decreased dorsiflexion - left   Ambulation Surface Level;Indoor;Unlevel;Outdoor;Paved;Grass   Gait velocity 2.74  with rollator and 1.63f/sec. with SPC   Stairs Yes   Stairs Assistance 6: Modified independent (Device/Increase time)  demonstrated safe technique.   Stair Management Technique One rail Right   Number of Stairs 8   Height of Stairs 6                PT Education - 03/28/15 1439    Education provided Yes   Education Details PT educated pt on placing rollator in backseat of car vs. trunk, so she can [facility] roll[facility]   Person(s) Educated Patient   Methods Explanation   Comprehension Verbalized understanding          PT Short Term Goals - 02/17/15 1320    PT SHORT TERM GOAL #1   Title Pt will be independent in HEP to improve strenght, balance, and endurance. Target date: 02/19/15.  Baseline 02/17/15: met today   Status Achieved   PT SHORT TERM GOAL #2   Title Pt will improve BERG score to >/=42/56 to decrease falls risk. Target date: 02/19/15.   Baseline 02/17/15: met today with score 46/56   Status Achieved   PT SHORT TERM GOAL #3   Title Pt will report no falls in the last four weeks to improve safety during functional mobility. Target date: 02/19/15.   Baseline 02/17/15: met today   Status Achieved   PT SHORT TERM GOAL #4   Title Pt will ambulate 300' with LRAD, over even terrain,  with supervison to improve functional mobility. Target date: 02/19/15.   Baseline 02/17/15: met at rollator level, needs min guard assist at times with straight  cane   Status Achieved   PT SHORT TERM GOAL #5   Title Pt will improve gait speed to >/=1.32f/sec. with LRAD to decrease falls risk. Target date: 02/19/15.   Baseline 02/17/15: met with rollator- 2.06 ft/sec. not met with cane- 1.64 ft/sec   Status Achieved           PT Long Term Goals - 0Jul 26, 20161441    PT LONG TERM GOAL #1   Title Pt will verbalize understanding of fall risk strategies to decrease falls risk. Target date: 03/19/15.   Baseline Met 03/24/15   Status Achieved   PT LONG TERM GOAL #2   Title Pt will improve BERG score to >/=46/56 to reduce falls risk. Target date: 03/19/15.   Baseline Met 03/17/15: scored 47/56   Status Achieved   PT LONG TERM GOAL #3   Title Pt will perform TUG with LRAD in <20 seconds to reduce falls risk. Target date: 03/19/15.   Baseline Met 03/24/15: 18.19 sec with SPC, 15.02 sec with rollator   Status Achieved   PT LONG TERM GOAL #4   Title Pt will ambulate 600' over even/uneven terrain with LRAD at MOD I level to improve functional mobility. Target date: 03/19/15.   Status Partially Met   PT LONG TERM GOAL #5   Title Pt will traverse 8 steps with one handrail at MOD I level to improve functional mobility. Target date: 03/19/15.   Status Achieved   PT LONG TERM GOAL #6   Title Pt will be report plans to join fitness center/[employer] upon discharge from PT to maintain gains made during PT. Target date: 03/19/15.   Baseline Met 03/24/15: Pt reported plans [facility] [employer] [facility]   Status Achieved               Plan - [id number]    Clinical Impression Statement Pt met all LTGs, except she partially met LTG 4. Please see d/c summary for details.          G-Codes - 007/26/161441    Functional Assessment Tool Used Berg=47/56, TUG=18.19 with SPC and 15.02 with rollator, gait speed=1.75 ft/sec with SPC and 2.727fsec with rollator   Functional Limitation Mobility: Walking and  moving around   Mobility: Walking and Moving Around Goal Status ([id number] least 1 percent but less than 20 percent impaired, limited or restricted   Mobility: Walking and Moving Around Discharge Status ([id number] least 1 percent but less than 20 percent impaired, limited or restricted      Problem List Patient Active Problem List   Diagnosis Date Noted  . TOBACCO ABUSE 09/06/2007  . OBSTRUCTIVE SLEEP APNEA 09/06/2007  .  COPD 09/06/2007    , L 03/28/2015, 2:42 PM  [facility] [address] [place], [place], [address] Phone: [phone]   Fax:  [phone]     PHYSICAL THERAPY DISCHARGE SUMMARY  Visits from Start of Care: 13  Current functional level related to goals / functional outcomes:     PT Long Term Goals - 03/28/15 1441    PT LONG TERM GOAL #1   Title Pt will verbalize understanding of fall risk strategies to decrease falls risk. Target date: 03/19/15.   Baseline Met 03/24/15   Status Achieved   PT LONG TERM GOAL #2   Title Pt will improve BERG score to >/=46/56 to reduce falls risk. Target date: 03/19/15.   Baseline Met 03/17/15: scored 47/56   Status Achieved   PT LONG TERM GOAL #3   Title Pt will perform TUG with LRAD in <20 seconds to reduce falls risk. Target date: 03/19/15.   Baseline Met 03/24/15: 18.19 sec with SPC, 15.02 sec with rollator   Status Achieved   PT LONG TERM GOAL #4   Title Pt will ambulate 600' over even/uneven terrain with LRAD at MOD I level to improve functional mobility. Target date: 03/19/15.   Status Partially Met   PT LONG TERM GOAL #5   Title Pt will traverse 8 steps with one handrail at MOD I level to improve functional mobility. Target date: 03/19/15.   Status Achieved   PT LONG TERM GOAL #6   Title Pt will be report plans to join fitness center/[employer] upon discharge from PT to maintain gains made during PT. Target date: 03/19/15.   Baseline Met  03/24/15: Pt reported plans to join [employer] through scholarship program or go to [facility] upon d/c from PT.   Status Achieved        Remaining deficits: Decrease L dorsiflexion during ambulation when fatigued.   Education / Equipment: HEP  Plan: Patient agrees to discharge.  Patient goals were met. Patient is being discharged due to meeting the stated rehab goals.  ?????         Geoffry Paradise, PT,DPT 03/28/2015 2:42 PM Phone: [phone] Fax: [phone]

## 2015-03-31 ENCOUNTER — Ambulatory Visit: Payer: Medicare Other | Admitting: Physical Therapy

## 2015-04-02 ENCOUNTER — Ambulatory Visit: Payer: Medicare Other | Admitting: Physical Therapy

## 2015-04-28 ENCOUNTER — Ambulatory Visit (INDEPENDENT_AMBULATORY_CARE_PROVIDER_SITE_OTHER): Payer: Medicare Other | Admitting: Neurology

## 2015-04-28 ENCOUNTER — Encounter: Payer: Self-pay | Admitting: Neurology

## 2015-04-28 VITALS — BP 120/64 | HR 68 | Ht 60.0 in | Wt 100.0 lb

## 2015-04-28 DIAGNOSIS — R413 Other amnesia: Secondary | ICD-10-CM | POA: Diagnosis not present

## 2015-04-28 DIAGNOSIS — R269 Unspecified abnormalities of gait and mobility: Secondary | ICD-10-CM | POA: Diagnosis not present

## 2015-04-28 NOTE — Progress Notes (Signed)
PATIENT: Linda Morse DOB: July 10, 1934  Chief Complaint  Patient presents with  . Memory Loss    MMSE - animals.  She is here with her daughter, Linda Morse, to discuss worsening of her memory.  She has been noticing a decline for the last eight months.       HISTORICAL  Linda Morse is a 81 years old right-handed female, seen in refer by her primary care physician  From [facility] physician Dr. 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, since 2015, she was noticed to have mild memory loss, tends to repeat herself, difficulty with multitasking, she still driving, without getting lost, was able to keep her book imbalance  In addition she was noticed gradual onset gait difficulty, getting worse since 2015, stiff cautious gait, also had urinary urgency, incontinence  REVIEW OF SYSTEMS: Full 14 system review of systems performed and notable only for weight loss, fatigue, snoring, incontinence, runny nose, feeling cold, easy bruising, memory loss, confusion, depression  ALLERGIES: Not on File  HOME MEDICATIONS: Current Outpatient Prescriptions  Medication Sig Dispense Refill  . Ascorbic Acid (VITAMIN C) 1000 MG tablet Take 1,000 mg by mouth daily.    Marland Kitchen aspirin EC 81 MG tablet Take 81 mg by mouth daily.    Marland Kitchen atorvastatin (LIPITOR) 40 MG tablet Take 40 mg by mouth daily.    . Cholecalciferol (D3-1000) 1000 UNITS capsule Take 1,000 Units by mouth daily.    . Cyanocobalamin (VITAMIN B-12) 1000 MCG SUBL Place 1,000 mcg under the tongue daily.    . magnesium citrate SOLN Take 1 Bottle by mouth once.    . Multiple  Vitamins-Minerals (VISION FORMULA/LUTEIN) TABS Take 1 tablet by mouth daily.    Marland Kitchen oxyCODONE (OXY IR/ROXICODONE) 5 MG immediate release tablet Take 1 tablet (5 mg total) by mouth every 3 (three) hours as needed. (Patient not taking: Reported on 01/22/2015) 40 tablet 0  . pyridOXINE (VITAMIN B-6) 100 MG tablet Take 100 mg by mouth daily.    Marland Kitchen senna-docusate (SENOKOT-S) 8.6-50 MG per tablet Take 1 tablet by mouth as needed for constipation.    . sertraline (ZOLOFT) 50 MG tablet Take 50 mg by mouth daily.     No current facility-administered medications for this visit.    PAST MEDICAL HISTORY: Past Medical History  Diagnosis Date  . Hypercholesterolemia   . Complication of anesthesia     takes 3- 4 days to wake up  . Arthritis   . Sleep apnea   . Memory loss   . Depression     PAST SURGICAL HISTORY: Past Surgical History  Procedure Laterality Date  . Back surgery    . Knee surgery    . Eye surgery Bilateral     catarats  . Abdominal hysterectomy    . Orif wrist fracture Right 06/16/2013    Procedure: OPEN REDUCTION INTERNAL FIXATION (ORIF) RIGHT WRIST FRACTURE ;  Surgeon: Roseanne Kaufman, MD;  Location: [facility];  Service: Orthopedics;  Laterality: Right;  . Neck surgery      FAMILY HISTORY: Family History  Problem Relation Age of Onset  . Hypertension Mother   . Lung  cancer Father   . Congestive Heart Failure Mother     SOCIAL HISTORY:  Social History   Social History  . Marital Status: Widowed    Spouse Name: [name]  . Number of Children: 3  . Years of Education: Some colle   Occupational History  . Retired    Social History Main Topics  . Smoking status: Current Every Day Smoker -- 1.00 packs/day for 57 years  . Smokeless tobacco: Not on file  . Alcohol Use: No  . Drug Use: No  . Sexual Activity: Not on file   Other Topics Concern  . Not on file   Social History Narrative   Lives at home with her grandson.   Right-handed.   2 cups caffeine daily.      PHYSICAL EXAM   Filed Vitals:   04/28/15 1310  BP: 120/64  Pulse: 68  Height: 5' (1.524 m)  Weight: 100 lb (45.36 kg)    Not recorded      Body mass index is 19.53 kg/(m^2).  PHYSICAL EXAMNIATION:  Gen: NAD, conversant, well nourised, obese, well groomed                     Cardiovascular: Regular rate rhythm, no peripheral edema, warm, nontender. Eyes: Conjunctivae clear without exudates or hemorrhage Neck: Supple, no carotid bruise. Pulmonary: Clear to auscultation bilaterally   NEUROLOGICAL EXAM:  MENTAL STATUS: Speech:    Speech is normal; fluent and spontaneous with normal comprehension.  Cognition: Mini-Mental Status Examination 29 out of 30     Orientation to time, place and person     Normal recent and remote memory     Attention span and concentration:  She missed one out of 3 recalls     Normal Language, naming, repeating,spontaneous speech     Fund of knowledge   CRANIAL NERVES: CN II: Visual fields are full to confrontation. Fundoscopic exam is normal with sharp discs and no vascular changes. Pupils are round equal and briskly reactive to light. CN III, IV, VI: extraocular movement are normal. No ptosis. CN V: Facial sensation is intact to pinprick in all 3 divisions bilaterally. Corneal responses are intact.  CN VII: Face is symmetric with normal eye closure and smile. CN VIII: Hearing is normal to rubbing fingers CN IX, X: Palate elevates symmetrically. Phonation is normal. CN XI: Head turning and shoulder shrug are intact CN XII: Tongue is midline with normal movements and no atrophy.  MOTOR: There is no pronator drift of out-stretched arms. Muscle bulk and tone are normal. Muscle strength is normal.  REFLEXES: Reflexes are  3 and symmetric at the biceps, triceps, knees, and ankles. Plantar responses are extensor bilaterally  SENSORY:  length dependent decreased light touch, pinprick, vibratory sensation to distal  shin.  COORDINATION: Rapid alternating movements and fine finger movements are intact. There is no dysmetria on finger-to-nose and heel-knee-shin.    GAIT/STANCE:  she needs to push up to get up from seated position, wide-based, stiff, unsteady gait Romberg is absent.   DIAGNOSTIC DATA (LABS, IMAGING, TESTING) - I reviewed patient records, labs, notes, testing and imaging myself where available.   ASSESSMENT AND PLAN  Linda Morse is a 81 y.o. female    Unsteady gait  most consistent with cervical  Spondylitic myelopathy   MRI of cervical   memory loss   Mini-Mental Status Examination is 29 out of 30   Laboratory showed normal CMP, B12, TSH   MRI of brain  Linda Morse, M.D. Ph.D.  [facility] [address], [place], [place] [address] Ph: [phone] Fax: [phone]  CC:  Dr. Kathyrn Lass

## 2015-05-08 ENCOUNTER — Ambulatory Visit
Admission: RE | Admit: 2015-05-08 | Discharge: 2015-05-08 | Disposition: A | Discharge status: Home / Self Care | Payer: Medicare Other | Source: Ambulatory Visit | Attending: Neurology | Admitting: Neurology

## 2015-05-08 DIAGNOSIS — R413 Other amnesia: Secondary | ICD-10-CM

## 2015-05-08 DIAGNOSIS — R269 Unspecified abnormalities of gait and mobility: Secondary | ICD-10-CM | POA: Diagnosis not present

## 2015-05-13 ENCOUNTER — Telehealth: Payer: Self-pay | Admitting: Neurology

## 2015-05-13 NOTE — Telephone Encounter (Signed)
Patient and daughter, Linda Morse, aware of results.

## 2015-05-13 NOTE — Telephone Encounter (Addendum)
  Michelle: Please call patient, MRI of the brain showed age related changes, atrophy, mild small vessel disease MRI of the cervical spine showed multilevel degenerative changes, postsurgical fusion C4-C7, evidence of previous myelopathy will go over detail at her next follow-up visit.  This is an abnormal MRI of the brain without contrast showing: 1. Severe generalized cortical atrophy. The enlarged ventricles appear to be in proportion to the extent of cortical atrophy, though an element of normal pressure hydrocephalus cannot be ruled out.  2. There are as scattered periventricular deep white matter T2/FLAIR hyperintense foci consistent with moderate chronic microvascular ischemic changes. None of these appears to be acute.  This is an abnormal MRI of the cervical spine show the following: 1. Prior surgical fusion from C4-C7.  2. There is hyperintense signal within the spinal cord adjacent to C5-C6 consistent with prior myelopathy. At this level, there is a midline bony ridge associated with mild spinal stenosis and right foraminal narrowing. There is no definite nerve root compression though there is some encroachment upon the exiting right C6 nerve root. 3. At C3-C4, there is moderate biforaminal narrowing. There is no definite nerve root compression though there is some encroachment upon both of the exiting C4 nerve roots. 4. Moderate degenerative changes at C4-C5 and C6-C7 did not lead to nerve root impingement. 5. A small midline disc protrusion is noted at C7-T1 causing mild spinal stenosis but no nerve root impingement.

## 2015-05-19 ENCOUNTER — Ambulatory Visit (INDEPENDENT_AMBULATORY_CARE_PROVIDER_SITE_OTHER): Payer: Medicare Other | Admitting: Neurology

## 2015-05-19 ENCOUNTER — Encounter: Payer: Self-pay | Admitting: Neurology

## 2015-05-19 VITALS — BP 134/69 | HR 73 | Ht 60.0 in | Wt 99.0 lb

## 2015-05-19 DIAGNOSIS — R413 Other amnesia: Secondary | ICD-10-CM | POA: Diagnosis not present

## 2015-05-19 DIAGNOSIS — R269 Unspecified abnormalities of gait and mobility: Secondary | ICD-10-CM | POA: Diagnosis not present

## 2015-05-19 DIAGNOSIS — R32 Unspecified urinary incontinence: Secondary | ICD-10-CM

## 2015-05-19 MED ORDER — MEMANTINE HCL 10 MG PO TABS
10.0000 mg | ORAL_TABLET | Freq: Two times a day (BID) | ORAL | Status: DC
Start: 2015-05-19 — End: 2016-05-24

## 2015-05-19 MED ORDER — SOLIFENACIN SUCCINATE 5 MG PO TABS: 5.0000 mg | ORAL_TABLET | Freq: Every day | ORAL | Status: DC

## 2015-05-19 NOTE — Progress Notes (Signed)
Chief Complaint  Patient presents with  . Gait Difficulty    She is here with her daugher, Linda Morse, to discuss her MRI results.  . Memory Loss    MMSE 28/30 - 10 animals.      PATIENT: Linda Morse DOB: 11-Apr-1934  Chief Complaint  Patient presents with  . Gait Difficulty    She is here with her daugher, Linda Morse, to discuss her MRI results.  . Memory Loss    MMSE 28/30 - 10 animals.     HISTORICAL  Linda Morse is a 81 years old right-handed female, seen in refer by her primary care physician  From [facility] physician Dr. 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, since 2015, she was noticed to have mild memory loss, tends to repeat herself, difficulty with multitasking, she still driving, without getting lost, was able to keep her book imbalance  In addition she was noticed gradual onset gait difficulty, getting worse since 2015, stiff cautious gait, also had urinary urgency, incontinence  UPDATE May 19 2015: She is with her daughter Linda Morse at today's clinical visit, she lives in attached unit at her daughter's house, was able to check on her daily basis, She continue has slow worsening memory trouble, unsteady gait, most bothersome symptoms is her worsening urinary urgency, frequent urinary incontinence. We have reviewed MRI of the brain September 2016:  Severe generalized cortical atrophy. The enlarged ventricles appear to be in proportion to the extent of cortical atrophy, though an element of normal pressure hydrocephalus cannot be ruled out. There are as scattered periventricular deep white matter  T2/FLAIR hyperintense foci consistent with moderate chronic microvascular ischemic changes. None of these appears to be acute.  MRI of cervical spine September 2016: MRI of the cervical spine show the following: 1. Prior surgical fusion from C4-C7.  2. There is hyperintense signal within the spinal cord adjacent to C5-C6 consistent with prior myelopathy. At this level, there is a midline bony ridge associated with mild spinal stenosis and right foraminal narrowing. There is no definite nerve root compression though there is some encroachment upon the exiting right C6 nerve root. 3. At C3-C4, there is moderate biforaminal narrowing. There is no definite nerve root compression though there is some encroachment upon both of the exiting C4 nerve roots. 4. Moderate degenerative changes at C4-C5 and C6-C7 did not lead to nerve root impingement. 5. A small midline disc protrusion is noted at C7-T1 causing mild spinal stenosis but no nerve root impingement   REVIEW OF SYSTEMS: Morse 14 system review of systems performed and notable only for insomnia, sleepiness, depression, decreased energy, fatigue, double vision, incontinence, feeling cold, increased thirst, running nose  ALLERGIES: Not on File  HOME MEDICATIONS: Current Outpatient Prescriptions  Medication Sig Dispense Refill  . Ascorbic Acid (VITAMIN C) 1000 MG tablet Take 1,000 mg by mouth daily.    Marland Kitchen aspirin EC 81 MG tablet Take 81 mg by mouth daily.    . Cholecalciferol (D3-1000) 1000 UNITS capsule Take 1,000 Units by mouth daily.    . Cyanocobalamin (VITAMIN B-12) 1000 MCG SUBL Place 1,000 mcg under the tongue daily.    Marland Kitchen  Multiple Vitamins-Minerals (VISION FORMULA/LUTEIN) TABS Take 1 tablet by mouth daily.    Marland Kitchen pyridOXINE (VITAMIN B-6) 100 MG tablet Take 100 mg by mouth daily.     No current facility-administered medications for this visit.    PAST MEDICAL HISTORY: Past Medical History  Diagnosis Date  . Hypercholesterolemia     . Complication of anesthesia     takes 3- 4 days to wake up  . Arthritis   . Sleep apnea   . Memory loss   . Depression     PAST SURGICAL HISTORY: Past Surgical History  Procedure Laterality Date  . Back surgery    . Knee surgery    . Eye surgery Bilateral     catarats  . Abdominal hysterectomy    . Orif wrist fracture Right 06/16/2013    Procedure: OPEN REDUCTION INTERNAL FIXATION (ORIF) RIGHT WRIST FRACTURE ;  Surgeon: Roseanne Kaufman, MD;  Location: [facility];  Service: Orthopedics;  Laterality: Right;  . Neck surgery      FAMILY HISTORY: Family History  Problem Relation Age of Onset  . Hypertension Mother   . Lung cancer Father   . Congestive Heart Failure Mother     SOCIAL HISTORY:  Social History   Social History  . Marital Status: Widowed    Spouse Name: [name]  . Number of Children: 3  . Years of Education: Some colle   Occupational History  . Retired    Social History Main Topics  . Smoking status: Current Every Day Smoker -- 1.00 packs/day for 57 years  . Smokeless tobacco: Not on file  . Alcohol Use: No  . Drug Use: No  . Sexual Activity: Not on file   Other Topics Concern  . Not on file   Social History Narrative   Lives at home with her grandson.   Right-handed.   2 cups caffeine daily.     PHYSICAL EXAM   Filed Vitals:   05/19/15 1522  BP: 134/69  Pulse: 73  Height: 5' (1.524 m)  Weight: 99 lb (44.906 kg)    Not recorded      Body mass index is 19.33 kg/(m^2).  PHYSICAL EXAMNIATION:  Gen: NAD, conversant, well nourised, obese, well groomed                     Cardiovascular: Regular rate rhythm, no peripheral edema, warm, nontender. Eyes: Conjunctivae clear without exudates or hemorrhage Neck: Supple, no carotid bruise. Pulmonary: Clear to auscultation bilaterally   NEUROLOGICAL EXAM:  MENTAL STATUS: Speech:    Speech is normal; fluent and spontaneous with normal comprehension.  Cognition: Mini-Mental Status Examination 28  out of 30, animal naming 10     Orientation to time, place and person     Recent and remote memory, missed 2 out of 3 recalls     Attention span and concentration:  She missed one out of 3 recalls     Normal Language, naming, repeating,spontaneous speech     Fund of knowledge   CRANIAL NERVES: CN II: Visual fields are Morse to confrontation. Fundoscopic exam is normal with sharp discs and no vascular changes. Pupils are round equal and briskly reactive to light. CN III, IV, VI: extraocular movement are normal. No ptosis. CN V: Facial sensation is intact to pinprick in all 3 divisions bilaterally. Corneal responses are intact.  CN VII: Face is symmetric with normal eye closure and smile. CN VIII: Hearing is normal to rubbing fingers CN IX, X: Palate  elevates symmetrically. Phonation is normal. CN XI: Head turning and shoulder shrug are intact CN XII: Tongue is midline with normal movements and no atrophy.  MOTOR: There is no pronator drift of out-stretched arms. Muscle bulk and tone are normal. Muscle strength is normal.  REFLEXES: Reflexes are  3 and symmetric at the biceps, triceps, knees, and ankles. Plantar responses are extensor bilaterally  SENSORY:  length dependent decreased light touch, pinprick, vibratory sensation to distal shin.  COORDINATION: Rapid alternating movements and fine finger movements are intact. There is no dysmetria on finger-to-nose and heel-knee-shin.    GAIT/STANCE:  she needs to push up to get up from seated position, wide-based, stiff, unsteady gait Romberg is absent.   DIAGNOSTIC DATA (LABS, IMAGING, TESTING) - I reviewed patient records, labs, notes, testing and imaging myself where available.   ASSESSMENT AND PLAN  Linda Morse is a 81 y.o. female    Unsteady gait,   Multifactorial, evidence of cervical myelopathy, cervical myelomalacia, periventricular small vessel disease, also likely a component of lumbar stenosis,    Mild  cognitive impairment:    Mini-Mental Status Examination is 28 out of 30   Central nervous system degenerative disorder, significant atrophy on MRI of the brain, possible Alzheimer's disease   Namenda 10 mg twice a day  Marcial Pacas, M.D. Ph.D.  [facility] [address], [place], [place] [address] Ph: [phone] Fax: [phone]  CC:  Dr. Kathyrn Lass

## 2015-05-20 ENCOUNTER — Telehealth: Payer: Self-pay | Admitting: Neurology

## 2015-05-20 NOTE — Telephone Encounter (Signed)
I was not able to reach patient or daughter about CREAD MCI trial. Chrisitan, left message.

## 2015-05-30 ENCOUNTER — Telehealth: Payer: Self-pay

## 2015-05-30 NOTE — Telephone Encounter (Signed)
I spoke to the patient's daughter, Jeanne Ivan, in regards to the CREAD study. The daughter stated that the patient was very interested in participating. The patient started memantine (NAMENDA) 10 MG tablet on [id number]. Therefore, the patient will be eligible for the study by [id number] and will be contacted by then. In the meantime, I will mail the Informed Consent Form (ICF) for further consideration. Paulette voiced understanding.

## 2015-08-14 ENCOUNTER — Telehealth: Payer: Self-pay

## 2015-08-14 NOTE — Telephone Encounter (Signed)
I spoke to the patient's daughter's Paulette in regards to the CREAD study. Paulette stated that the patient is still on memantine (NAMENDA) 10 MG tablet and would like to participate in the study. Therefore, a visit for a memory loss test (FCSRT) has been scheduled for [id number] at 14:00h. Paulette voiced understanding and appreciation.

## 2015-08-19 ENCOUNTER — Ambulatory Visit: Payer: Medicare Other | Admitting: Nurse Practitioner

## 2015-08-19 ENCOUNTER — Encounter (INDEPENDENT_AMBULATORY_CARE_PROVIDER_SITE_OTHER): Payer: Self-pay

## 2015-08-19 DIAGNOSIS — Z0289 Encounter for other administrative examinations: Secondary | ICD-10-CM

## 2015-09-03 ENCOUNTER — Telehealth: Payer: Self-pay | Admitting: *Deleted

## 2015-09-03 ENCOUNTER — Ambulatory Visit: Payer: Medicare Other | Admitting: Nurse Practitioner

## 2015-09-03 NOTE — Telephone Encounter (Signed)
I spoke to daughter.  Due to a family emer/ hospice situation, they rescheduled for next week.

## 2015-09-03 NOTE — Telephone Encounter (Signed)
I called and LMVM for paulette, daughter, asking if could delay appt for pt to 1430 today.  Please call back.

## 2015-09-10 ENCOUNTER — Encounter: Payer: Self-pay | Admitting: Nurse Practitioner

## 2015-09-10 ENCOUNTER — Ambulatory Visit (INDEPENDENT_AMBULATORY_CARE_PROVIDER_SITE_OTHER): Payer: Medicare Other | Admitting: Nurse Practitioner

## 2015-09-10 ENCOUNTER — Encounter (INDEPENDENT_AMBULATORY_CARE_PROVIDER_SITE_OTHER): Payer: Self-pay

## 2015-09-10 VITALS — BP 132/70 | HR 71 | Ht 60.0 in | Wt 102.0 lb

## 2015-09-10 DIAGNOSIS — Z0289 Encounter for other administrative examinations: Secondary | ICD-10-CM

## 2015-09-10 DIAGNOSIS — R413 Other amnesia: Secondary | ICD-10-CM

## 2015-09-10 DIAGNOSIS — R269 Unspecified abnormalities of gait and mobility: Secondary | ICD-10-CM | POA: Diagnosis not present

## 2015-09-10 NOTE — Progress Notes (Signed)
I have reviewed and agreed above plan. 

## 2015-09-10 NOTE — Patient Instructions (Signed)
Continue Namenda 1 twice daily Memory score is stable F/U 6 months

## 2015-09-10 NOTE — Progress Notes (Signed)
[facility]  PATIENT: Linda Morse DOB: September 20, 1933   REASON FOR VISIT:  Memory loss , gait difficulty , incontinence HISTORY FROM:patient and daughter    HISTORY OF PRESENT ILLNESS: HISTORY Linda Morse is a 82 years old right-handed female, seen in refer by her primary care physician From [facility] physician Linda Morse for evaluation of gait difficulty, memory loss  I have reviewed and summarized most recent office note in April 10 2015, she has past medical history of cervical decompression surgery in 2010, presented with neck pain, cervical radiculopathy prior to decompression, also had a history of lumbar laminectomy in 2004, COPD, left knee surgery  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, since 2015, she was noticed to have mild memory loss, tends to repeat herself, difficulty with multitasking, she still driving, without getting lost, was able to keep her book imbalance  In addition she was noticed gradual onset gait difficulty, getting worse since 2015, stiff cautious gait, also had urinary urgency, incontinence  UPDATE May 19 2015: She is with her daughter Linda Morse at today's clinical visit, she lives in attached unit at her daughter's house, was able to check on her daily basis, She continue has slow worsening memory trouble, unsteady gait, most bothersome symptoms is her worsening urinary urgency, frequent urinary incontinence. We have reviewed MRI of the brain September 2016:  Severe generalized cortical atrophy. The enlarged ventricles appear to be in proportion to the extent of cortical atrophy, though an element of normal pressure hydrocephalus cannot be ruled out. There are as scattered periventricular deep white matter T2/FLAIR hyperintense foci consistent with moderate chronic microvascular ischemic changes. None of these appears to be acute.  MRI of cervical spine September 2016: MRI of the cervical spine show the  following: 1. Prior surgical fusion from C4-C7.  2. There is hyperintense signal within the spinal cord adjacent to C5-C6 consistent with prior myelopathy. At this level, there is a midline bony ridge associated with mild spinal stenosis and right foraminal narrowing. There is no definite nerve root compression though there is some encroachment upon the exiting right C6 nerve root. 3. At C3-C4, there is moderate biforaminal narrowing. There is no definite nerve root compression though there is some encroachment upon both of the exiting C4 nerve roots. 4. Moderate degenerative changes at C4-C5 and C6-C7 did not lead to nerve root impingement. 5. A small midline disc protrusion is noted at C7-T1 causing mild spinal stenosis but no nerve root impingement  UPDATE 09/10/2015 Linda Morse, 82 year old female returns for follow-up. She was last seen by Dr.  Krista Morse 05/19/2015. She has a history of memory loss and currently only taking Namenda daily instead of twice daily. She returns with her daughter today. The patient continues to live alone but the daughter is close by. She also has a history of obstructive sleep apnea but stopped using her CPAP machine over 2 years ago.No safety issues identified. Appetite is reportedly good and sleeping well. She returns for reevaluation  REVIEW OF SYSTEMS: Full 14 system review of systems performed and notable only for those listed, all others are neg:  Constitutional: neg  Cardiovascular: neg Ear/Nose/Throat: neg  Skin: neg Eyes: neg Respiratory: neg Gastroitestinal: neg  Hematology/Lymphatic: neg  Endocrine: intolerance to cold Musculoskeletal:aching muscles Allergy/Immunology: neg Neurological: memory loss Psychiatric: depression Sleep : neg   ALLERGIES: Not on File  HOME MEDICATIONS: Outpatient Prescriptions Prior to Visit  Medication Sig Dispense Refill  . Ascorbic Acid (VITAMIN  C) 1000 MG tablet Take 1,000 mg by mouth daily.    Marland Kitchen aspirin EC  81 MG tablet Take 81 mg by mouth daily.    . Cholecalciferol (D3-1000) 1000 UNITS capsule Take 1,000 Units by mouth daily.    . Cyanocobalamin (VITAMIN B-12) 1000 MCG SUBL Place 1,000 mcg under the tongue daily.    . memantine (NAMENDA) 10 MG tablet Take 1 tablet (10 mg total) by mouth 2 (two) times daily. 60 tablet 11  . Multiple Vitamins-Minerals (VISION FORMULA/LUTEIN) TABS Take 1 tablet by mouth daily.    Marland Kitchen pyridOXINE (VITAMIN B-6) 100 MG tablet Take 100 mg by mouth daily.    . solifenacin (VESICARE) 5 MG tablet Take 1 tablet (5 mg total) by mouth daily. 30 tablet 6   No facility-administered medications prior to visit.    PAST MEDICAL HISTORY: Past Medical History  Diagnosis Date  . Hypercholesterolemia   . Complication of anesthesia     takes 3- 4 days to wake up  . Arthritis   . Sleep apnea   . Memory loss   . Depression     PAST SURGICAL HISTORY: Past Surgical History  Procedure Laterality Date  . Back surgery    . Knee surgery    . Eye surgery Bilateral     catarats  . Abdominal hysterectomy    . Orif wrist fracture Right 06/16/2013    Procedure: OPEN REDUCTION INTERNAL FIXATION (ORIF) RIGHT WRIST FRACTURE ;  Surgeon: Roseanne Kaufman, MD;  Location: [place];  Service: Orthopedics;  Laterality: Right;  . Neck surgery      FAMILY HISTORY: Family History  Problem Relation Age of Onset  . Hypertension Mother   . Lung cancer Father   . Congestive Heart Failure Mother     SOCIAL HISTORY: Social History   Social History  . Marital Status: Widowed    Spouse Name: [name]  . Number of Children: 3  . Years of Education: Some colle   Occupational History  . Retired    Social History Main Topics  . Smoking status: Current Every Day Smoker -- 1.00 packs/day for 57 years  . Smokeless tobacco: Not on file  . Alcohol Use: No  . Drug Use: No  . Sexual Activity: Not on file   Other Topics Concern  . Not on file   Social History Narrative   Lives at home with her  grandson.   Right-handed.   2 cups caffeine daily.     PHYSICAL EXAM  Filed Vitals:   09/10/15 1347  BP: 132/70  Pulse: 71  Height: 5' (1.524 m)  Weight: 102 lb (46.267 kg)   Body mass index is 19.92 kg/(m^2).  Generalized: Well developed, in no acute distress ,well-groomed Head: normocephalic and atraumatic,. Oropharynx benign  Neck: Supple, no carotid bruits  Cardiac: Regular rate rhythm, no murmur  Musculoskeletal: No deformity   Neurological examination   Mentation: Alert MMSE 28/30. Missing 1 of 3 recall and today's date.AFT 8. Clock drawing 2/4.  Follows all commands speech and language fluent.   Cranial nerve II-XII: Fundoscopic exam reveals sharp disc margins.Pupils were equal round reactive to light extraocular movements were full, visual field were full on confrontational test. Facial sensation and strength were normal. hearing was intact to finger rubbing bilaterally. Uvula tongue midline. head turning and shoulder shrug were normal and symmetric.Tongue protrusion into cheek strength was normal. Motor: normal bulk and tone, full strength in the BUE, BLE, fine finger movements normal, no pronator drift.  No focal weakness Sensory: length dependent decreased light touch pinprick and vibratory sensation to distal shin Coordination: finger-nose-finger, heel-to-shin bilaterally, no dysmetria Reflexes: reflexes are 3+ and symmetric upper and lower plantar responses were extensor bilaterally. Gait and Station: Rising up from seated position with push off, wide based unsteady gait, Romberg negative ambulates with rolling walker.  DIAGNOSTIC DATA (LABS, IMAGING, TESTING) -  ASSESSMENT AND PLAN  82 y.o. year old female  has a past medical history of unsteady gait which is multifactorial and evidence of evidence of cervical myelopathy, cervical myelomalacia, periventricular small vessel disease, also likely a component of lumbar stenosis,mild cognitive impairment with  Mini-Mental Status exam 28 out of 30 significant atrophy on MRI of the brain currently taking Namenda 10 mg daily. Instructed to take twice daily. She will follow-up in 6 months Dennie Bible, [facility], [facility], APRN  [facility] [address], [place], [place] [address] [phone]

## 2015-09-22 ENCOUNTER — Encounter (HOSPITAL_COMMUNITY): Payer: Self-pay | Admitting: Emergency Medicine

## 2015-09-22 ENCOUNTER — Emergency Department (HOSPITAL_COMMUNITY)
Admission: EM | Admit: 2015-09-22 | Discharge: 2015-09-22 | Disposition: A | Discharge status: Home / Self Care | Payer: Medicare Other | Attending: Emergency Medicine | Admitting: Emergency Medicine

## 2015-09-22 ENCOUNTER — Emergency Department (HOSPITAL_COMMUNITY): Payer: Medicare Other

## 2015-09-22 DIAGNOSIS — F17200 Nicotine dependence, unspecified, uncomplicated: Secondary | ICD-10-CM | POA: Insufficient documentation

## 2015-09-22 DIAGNOSIS — R55 Syncope and collapse: Secondary | ICD-10-CM | POA: Insufficient documentation

## 2015-09-22 DIAGNOSIS — F329 Major depressive disorder, single episode, unspecified: Secondary | ICD-10-CM | POA: Diagnosis not present

## 2015-09-22 DIAGNOSIS — Z8669 Personal history of other diseases of the nervous system and sense organs: Secondary | ICD-10-CM | POA: Diagnosis not present

## 2015-09-22 DIAGNOSIS — M1990 Unspecified osteoarthritis, unspecified site: Secondary | ICD-10-CM | POA: Insufficient documentation

## 2015-09-22 DIAGNOSIS — Z8639 Personal history of other endocrine, nutritional and metabolic disease: Secondary | ICD-10-CM | POA: Diagnosis not present

## 2015-09-22 DIAGNOSIS — Z7982 Long term (current) use of aspirin: Secondary | ICD-10-CM | POA: Diagnosis not present

## 2015-09-22 DIAGNOSIS — Z79899 Other long term (current) drug therapy: Secondary | ICD-10-CM | POA: Insufficient documentation

## 2015-09-22 LAB — URINE MICROSCOPIC-ADD ON

## 2015-09-22 LAB — CBC
HCT: 41.6 % (ref 36.0–46.0)
Hemoglobin: 13.5 g/dL (ref 12.0–15.0)
MCH: 30.3 pg (ref 26.0–34.0)
MCHC: 32.5 g/dL (ref 30.0–36.0)
MCV: 93.3 fL (ref 78.0–100.0)
PLATELETS: 285 10*3/uL (ref 150–400)
RBC: 4.46 MIL/uL (ref 3.87–5.11)
RDW: 12.8 % (ref 11.5–15.5)
WBC: 8.6 10*3/uL (ref 4.0–10.5)

## 2015-09-22 LAB — BASIC METABOLIC PANEL
ANION GAP: 10 (ref 5–15)
BUN: 19 mg/dL (ref 6–20)
CALCIUM: 9 mg/dL (ref 8.9–10.3)
CO2: 27 mmol/L (ref 22–32)
Chloride: 104 mmol/L (ref 101–111)
Creatinine, Ser: 0.72 mg/dL (ref 0.44–1.00)
GFR calc Af Amer: >60 mL/min (ref >60)
GLUCOSE: 122 mg/dL — AB (ref 65–99)
Potassium: 3.9 mmol/L (ref 3.5–5.1)
Sodium: 141 mmol/L (ref 135–145)

## 2015-09-22 LAB — URINALYSIS, ROUTINE W REFLEX MICROSCOPIC
BILIRUBIN URINE: NEGATIVE
Glucose, UA: NEGATIVE mg/dL
Hgb urine dipstick: NEGATIVE
KETONES UR: NEGATIVE mg/dL
NITRITE: NEGATIVE
Protein, ur: NEGATIVE mg/dL
Specific Gravity, Urine: 1.017 (ref 1.005–1.030)
pH: 7.5 (ref 5.0–8.0)

## 2015-09-22 LAB — I-STAT TROPONIN, ED: TROPONIN I, POC: 0 ng/mL (ref 0.00–0.08)

## 2015-09-22 MED ORDER — SODIUM CHLORIDE 0.9 % IV BOLUS (SEPSIS)
1000.0000 mL | Freq: Once | INTRAVENOUS | Status: AC
  Administered 2015-09-22: 1000 mL via INTRAVENOUS

## 2015-09-22 NOTE — Discharge Instructions (Signed)
You have been seen today for a syncopal episode. Your lab tests showed no abnormalities. Keep your appointment for follow-up with your PCP at 2:30 PM tomorrow. Return to ED should symptoms worsen.

## 2015-09-22 NOTE — ED Notes (Signed)
Patient aware urine sample is needed, Patient attempted to provide sample and was unsuccessful. Patient will notify staff when able to urinate again.

## 2015-09-22 NOTE — ED Provider Notes (Signed)
CSN: [id number]     Arrival date & time 09/22/15  1150 History   First MD Initiated Contact with Patient 09/22/15 1345     Chief Complaint  Patient presents with  . Near Syncope     (Consider location/radiation/quality/duration/timing/severity/associated sxs/prior Treatment) HPI   Linda Morse is a 82 y.o. female, with a history of depression and memory loss, presenting to the ED with a syncopal episode during an exercise class at her church. Pt states she was sitting in a chair putting her head up and down, working her neck muscles when she "passed out." Witnesses state pt became pale, sweaty, and was unresponsive for approximately 5-6 minutes. Patient did not fall, but stayed sitting upright in the chair with the help of her daughter and bystanders. Patient was then lowered to the ground. After patient regained consciousness, she quickly became alert and oriented. Patient states that she does not remember the events leading up to her syncope, but her daughter at the bedside states that this would not be unusual for her due to some recent memory problems. Currently states she feels fine and has no complaints. Patient denies chest pain, shortness of breath, dizziness, recent illness, nausea/vomiting, fever/chills, or any other complaints. Pt has no history of heart issues or arrhythmias, including Afib. Patient is accompanied by her 3 daughters at the bedside.    Past Medical History  Diagnosis Date  . Hypercholesterolemia   . Complication of anesthesia     takes 3- 4 days to wake up  . Arthritis   . Sleep apnea   . Memory loss   . Depression    Past Surgical History  Procedure Laterality Date  . Back surgery    . Knee surgery    . Eye surgery Bilateral     catarats  . Abdominal hysterectomy    . Orif wrist fracture Right 06/16/2013    Procedure: OPEN REDUCTION INTERNAL FIXATION (ORIF) RIGHT WRIST FRACTURE ;  Surgeon: Roseanne Kaufman, MD;  Location: [facility];  Service:  Orthopedics;  Laterality: Right;  . Neck surgery     Family History  Problem Relation Age of Onset  . Hypertension Mother   . Lung cancer Father   . Congestive Heart Failure Mother    Social History  Substance Use Topics  . Smoking status: Current Every Day Smoker -- 1.00 packs/day for 57 years  . Smokeless tobacco: None  . Alcohol Use: No   OB History    No data available     Review of Systems  Constitutional: Negative for fever and chills.  Respiratory: Negative for cough and shortness of breath.   Cardiovascular: Negative for chest pain.  Gastrointestinal: Negative for nausea, vomiting, abdominal pain, diarrhea, constipation and blood in stool.  Genitourinary: Negative for dysuria and flank pain.  Skin: Negative for color change and pallor.  Neurological: Positive for syncope. Negative for dizziness, facial asymmetry, speech difficulty, weakness, light-headedness and headaches.  All other systems reviewed and are negative.     Allergies  Review of patient's allergies indicates no known allergies.  Home Medications   Prior to Admission medications   Medication Sig Start Date End Date Taking? Authorizing Provider  alendronate (FOSAMAX) 70 MG tablet Take 70 mg by mouth once a week.  08/09/15  Yes Historical Provider, MD  Ascorbic Acid (VITAMIN C) 1000 MG tablet Take 1,000 mg by mouth daily.   Yes Historical Provider, MD  aspirin EC 81 MG tablet Take 81 mg by mouth daily.  Yes Historical Provider, MD  Cholecalciferol (D3-1000) 1000 UNITS capsule Take 1,000 Units by mouth daily.   Yes Historical Provider, MD  Cyanocobalamin (VITAMIN B-12) 1000 MCG SUBL Place 1,000 mcg under the tongue daily.   Yes Historical Provider, MD  FLUoxetine (PROZAC) 20 MG capsule Take 20 mg by mouth daily.  08/07/15  Yes Historical Provider, MD  memantine (NAMENDA) 10 MG tablet Take 1 tablet (10 mg total) by mouth 2 (two) times daily. 05/19/15  Yes Marcial Pacas, MD  Multiple Vitamins-Minerals (ALIVE  WOMENS GUMMY PO) Take 2 capsules by mouth daily.   Yes Historical Provider, MD  pyridOXINE (VITAMIN B-6) 100 MG tablet Take 100 mg by mouth daily.   Yes Historical Provider, MD  solifenacin (VESICARE) 5 MG tablet Take 1 tablet (5 mg total) by mouth daily. 05/19/15  Yes Marcial Pacas, MD   BP 137/65 mmHg  Pulse 75  Temp(Src) 98.7 F (37.1 C) (Oral)  Resp 22  SpO2 98% Physical Exam  Constitutional: She is oriented to person, place, and time. She appears well-developed and well-nourished. No distress.  HENT:  Head: Normocephalic and atraumatic.  Eyes: Conjunctivae and EOM are normal. Pupils are equal, round, and reactive to light.  Neck: Normal range of motion. Neck supple.  Cardiovascular: Normal rate, regular rhythm, normal heart sounds and intact distal pulses.   Pulmonary/Chest: Effort normal and breath sounds normal. No respiratory distress.  Abdominal: Soft. Bowel sounds are normal. There is no tenderness.  Musculoskeletal: She exhibits no edema or tenderness.  Full ROM in all extremities and spine. No paraspinal tenderness.   Lymphadenopathy:    She has no cervical adenopathy.  Neurological: She is alert and oriented to person, place, and time. She has normal reflexes.  No sensory deficits. Strength 5/5 in all extremities. Gait testing deferred. Coordination intact. Cranial nerves III-XII grossly intact. No facial droop.   Skin: Skin is warm and dry. She is not diaphoretic.  Nursing note and vitals reviewed.   ED Course  Procedures (including critical care time) Labs Review Labs Reviewed  BASIC METABOLIC PANEL - Abnormal; Notable for the following:    Glucose, Bld 122 (*)    All other components within normal limits  URINE CULTURE  CBC  URINALYSIS, ROUTINE W REFLEX MICROSCOPIC (NOT AT [facility])  Randolm Idol, ED    Imaging Review Dg Chest 2 View  09/22/2015  CLINICAL DATA:  Syncope EXAM: CHEST  2 VIEW COMPARISON:  11/15/2014 chest radiograph. FINDINGS: Partially visualized  surgical plate with interlocking screws overlies the lower cervical spine. Stable cardiomediastinal silhouette with normal heart size. No pneumothorax. No pleural effusion. Lungs appear clear, with no acute consolidative airspace disease and no pulmonary edema. IMPRESSION: No active cardiopulmonary disease. Electronically Signed   By: Ilona Sorrel M.D.   On: 09/22/2015 14:32   I have personally reviewed and evaluated these images and lab results as part of my medical decision-making.   EKG Interpretation   Date/Time:  Monday September 22 2015 15:34:10 EST Ventricular Rate:  77 PR Interval:    QRS Duration: 82 QT Interval:  353 QTC Calculation: 399 R Axis:   70 Text Interpretation:  Atrial fibrillation Anteroseptal infarct, old  Borderline T abnormalities, inferior leads Since last tracing of earlier  today No significant change was found Confirmed by [facility]  MD, ELLIOTT  [phone]) on 09/22/2015 4:00:59 PM       Orthostatic VS for the past 24 hrs:  BP- Lying Pulse- Lying BP- Sitting Pulse- Sitting BP- Standing at 0  minutes Pulse- Standing at 0 minutes  09/22/15 1601 137/65 mmHg 96 135/82 mmHg 81 (!) 157/114 mmHg 79  09/22/15 1212 142/70 mmHg 67 132/71 mmHg 77 156/64 mmHg 81       MDM   Final diagnoses:  Syncope, unspecified syncope type    [name] presents following a syncopal episode just prior to arrival.  Findings and plan of care discussed with Daleen Bo, MD.  Patient's A. fib seems as though it may be new, but it is not unstable. Patient was not orthostatic here in the ED. Patient will get some fluids and be reevaluated. Patient is nontoxic appearing, not tachycardic, is normotensive, is afebrile, not tachypneic on my exam, maintains SPO2 of 98-100% on room air, and is in no apparent distress. Fluid challenge, repeat orthostatics, and repeat neuro exam and it is possible that the patient can be discharged to have close follow-up with her PCP. Patient stayed  stable throughout her time in the ED. Patient regained her appetite and ate a full meal. Patient was ambulated and orthostatic vital signs were checked again, both actions were normal. Patient and patient's daughters were given return precautions. The patient was able to secure a follow-up appointment with her PCP, Dr. Sabra Heck, for 2:30 PM tomorrow. Patient voiced understanding of the importance of keeping this appointment. Patient and patient's daughters voice understanding of all instructions, accept the plan, and are comfortable with discharge.  Filed Vitals:   09/22/15 1204 09/22/15 1439 09/22/15 1600  BP: 146/71 132/81 137/65  Pulse: 85 66 75  Temp: 97.7 F (36.5 C)  98.7 F (37.1 C)  TempSrc: Oral  Oral  Resp: 16 14 22   SpO2: 100% 100% 98%     Lorayne Bender, PA-C 09/22/15 [place], MD 09/22/15 1654

## 2015-09-22 NOTE — ED Provider Notes (Signed)
  Face-to-face evaluation   History: Patient was doing exercises, including head movements, and flexing at the waist, when she felt dizzy and wanted to lie down. So was with her and had her sit in a chair, whereupon she passed out for "5 minutes". She was supported in a sitting position during this episode of syncope. When she awoke, she acted normal and had no ongoing symptoms. Prior to this, she had not, in much. She is hungry now.  Physical exam: Alert, elderly female who is lucid, and comfortable. Heart irregular, no murmur. Lungs clear. Neurologic exam is nonfocal.  Medical screening examination/treatment/procedure(s) were conducted as a shared visit with non-physician practitioner(s) and myself.  I personally evaluated the patient during the encounter  Daleen Bo, MD 09/22/15 1654

## 2015-09-22 NOTE — ED Notes (Signed)
Pa 

## 2015-09-22 NOTE — ED Notes (Signed)
Per EMS, Pt was in [employer] at [employer] doing light chair exercises when pt had to be helped down into a chair because she became unresponsive and pale. Pt did not fall. She was found to be orthostatic for first responders. A&Ox4.

## 2015-09-22 NOTE — ED Notes (Signed)
Patient ambulated with pulse ox and readings remained between 92-97 for oxygen and 81-88 for pulse

## 2015-09-23 LAB — URINE CULTURE

## 2015-11-06 ENCOUNTER — Ambulatory Visit: Payer: Medicare Other | Admitting: Cardiology

## 2015-12-08 ENCOUNTER — Encounter: Payer: Self-pay | Admitting: Cardiology

## 2015-12-08 ENCOUNTER — Ambulatory Visit (INDEPENDENT_AMBULATORY_CARE_PROVIDER_SITE_OTHER): Payer: Medicare Other | Admitting: Cardiology

## 2015-12-08 VITALS — BP 120/78 | HR 84 | Ht 60.0 in | Wt 100.8 lb

## 2015-12-08 DIAGNOSIS — I481 Persistent atrial fibrillation: Secondary | ICD-10-CM | POA: Diagnosis not present

## 2015-12-08 DIAGNOSIS — R0602 Shortness of breath: Secondary | ICD-10-CM | POA: Diagnosis not present

## 2015-12-08 DIAGNOSIS — R55 Syncope and collapse: Secondary | ICD-10-CM | POA: Diagnosis not present

## 2015-12-08 DIAGNOSIS — R5383 Other fatigue: Secondary | ICD-10-CM

## 2015-12-08 DIAGNOSIS — R413 Other amnesia: Secondary | ICD-10-CM

## 2015-12-08 DIAGNOSIS — I4819 Other persistent atrial fibrillation: Secondary | ICD-10-CM

## 2015-12-08 DIAGNOSIS — T732XXA Exhaustion due to exposure, initial encounter: Secondary | ICD-10-CM

## 2015-12-08 HISTORY — DX: Other fatigue: R53.83

## 2015-12-08 HISTORY — DX: Other persistent atrial fibrillation: I48.19

## 2015-12-08 HISTORY — DX: Syncope and collapse: R55

## 2015-12-08 LAB — TSH: TSH: 1.21 mIU/L

## 2015-12-08 MED ORDER — APIXABAN 5 MG PO TABS: 5.0000 mg | ORAL_TABLET | Freq: Two times a day (BID) | ORAL | Status: DC

## 2015-12-08 NOTE — Patient Instructions (Addendum)
Medication Instructions:  Your physician has recommended you make the following change in your medication:  1) STOP PRADAXA 2) START ELIQUIS 5 mg TWICE DAILY   Labwork: TODAY: TSH  Testing/Procedures: Your physician has requested that you have an echocardiogram. Echocardiography is a painless test that uses sound waves to create images of your heart. It provides your doctor with information about the size and shape of your heart and how well your heart's chambers and valves are working. This procedure takes approximately one hour. There are no restrictions for this procedure.  Follow-Up: Your physician recommends that you schedule a follow-up appointment in 4 weeks with a PA or NP.  Any Other Special Instructions Will Be Listed Below (If Applicable).     If you need a refill on your cardiac medications before your next appointment, please call your pharmacy.

## 2015-12-08 NOTE — Progress Notes (Signed)
Cardiology Office Note    Date:  12/08/2015   ID:  Linda Morse, DOB [birth year], MRN [id number]  PCP:  Tawanna Solo, MD  Cardiologist:  Sueanne Margarita, MD   Chief Complaint  Patient presents with  . Loss of Consciousness  . Atrial Fibrillation    History of Present Illness:  Linda Morse is a 82 y.o. female who presents today for evaluation of atrial fibrillation and syncope.  She was in exercise class at her church and became pale, diaphoretic and passed out.  At the time she had been sitting in a chair putting her head up and down, working her neck muscles. Patient did not fall, but stayed sitting upright in the chair with the help of her daughter and bystanders. Patient was then lowered to the ground. After patient regained consciousness, she quickly became alert and oriented. Patient states that she does not remember the events leading up to her syncope, but her daughter at the bedside states that this would not be unusual for her due to some recent memory problems.   This is the first episode of syncope that her daughter is aware of.  The patient has memory issues. In the ER her cardiac workup was normal except for EKG showing new onset atrial fibrillation.  Patient denies chest pain, LE edema, palpitations or dizziness. She does complain of DOE. Patient has no history of cardiac problems.  Her daughter states that the ER MD felt she was dehydrated and she received fluids in the ER.  She has no complaints today except for fatigue.  She has a history of OSA but did not tolerate CPAP     Past Medical History  Diagnosis Date  . Hypercholesterolemia   . Complication of anesthesia     takes 3- 4 days to wake up  . Arthritis   . Sleep apnea   . Memory loss   . Depression   . Atrial fibrillation, persistent ([place]) 12/08/2015  . Syncope 12/08/2015    Past Surgical History  Procedure Laterality Date  . Back surgery    . Knee surgery    . Eye surgery Bilateral    catarats  . Abdominal hysterectomy    . Orif wrist fracture Right 06/16/2013    Procedure: OPEN REDUCTION INTERNAL FIXATION (ORIF) RIGHT WRIST FRACTURE ;  Surgeon: Roseanne Kaufman, MD;  Location: [facility];  Service: Orthopedics;  Laterality: Right;  . Neck surgery      Current Medications: Outpatient Prescriptions Prior to Visit  Medication Sig Dispense Refill  . alendronate (FOSAMAX) 70 MG tablet Take 70 mg by mouth once a week.     . Ascorbic Acid (VITAMIN C) 1000 MG tablet Take 1,000 mg by mouth daily.    Marland Kitchen aspirin EC 81 MG tablet Take 81 mg by mouth daily.    . Cholecalciferol (D3-1000) 1000 UNITS capsule Take 1,000 Units by mouth daily.    . Cyanocobalamin (VITAMIN B-12) 1000 MCG SUBL Place 1,000 mcg under the tongue daily.    Marland Kitchen FLUoxetine (PROZAC) 20 MG capsule Take 20 mg by mouth daily.     . memantine (NAMENDA) 10 MG tablet Take 1 tablet (10 mg total) by mouth 2 (two) times daily. 60 tablet 11  . Multiple Vitamins-Minerals (ALIVE WOMENS GUMMY PO) Take 2 capsules by mouth daily.    Marland Kitchen pyridOXINE (VITAMIN B-6) 100 MG tablet Take 100 mg by mouth daily.    . solifenacin (VESICARE) 5 MG tablet Take 1 tablet (5 mg total)  by mouth daily. 30 tablet 6   No facility-administered medications prior to visit.     Allergies:   Review of patient's allergies indicates no known allergies.   Social History   Social History  . Marital Status: Widowed    Spouse Name: [name]  . Number of Children: 3  . Years of Education: Some colle   Occupational History  . Retired    Social History Main Topics  . Smoking status: Current Every Day Smoker -- 1.00 packs/day for 57 years  . Smokeless tobacco: None  . Alcohol Use: No  . Drug Use: No  . Sexual Activity: Not Asked   Other Topics Concern  . None   Social History Narrative   Lives at home with her grandson.   Right-handed.   2 cups caffeine daily.     Family History:  The patient's family history includes Congestive Heart Failure in her  mother; Hypertension in her mother; Lung cancer in her father.   ROS:   Please see the history of present illness.    ROS All other systems reviewed and are negative.   PHYSICAL EXAM:   VS:  BP 120/78 mmHg  Pulse 84  Ht 5' (1.524 m)  Wt 100 lb 12.8 oz (45.723 kg)  BMI 19.69 kg/m2  SpO2 96%   GEN: Well nourished, well developed, in no acute distress HEENT: normal Neck: no JVD, carotid bruits, or masses Cardiac: RRR; no murmurs, rubs, or gallops,no edema.  Intact distal pulses bilaterally.  Respiratory:  clear to auscultation bilaterally, normal work of breathing GI: soft, nontender, nondistended, + BS MS: no deformity or atrophy Skin: warm and dry, no rash Neuro:  Alert and Oriented x 3, Strength and sensation are intact Psych: euthymic mood, full affect  Wt Readings from Last 3 Encounters:  12/08/15 100 lb 12.8 oz (45.723 kg)  09/10/15 102 lb (46.267 kg)  05/19/15 99 lb (44.906 kg)      Studies/Labs Reviewed:   EKG:  EKG is ordered today.  The ekg ordered today demonstrates atrial fibrillation with CVR at 73bpm with nonspecific ST abnormality  Recent Labs: 09/22/2015: BUN 19; Creatinine, Ser 0.72; Hemoglobin 13.5; Platelets 285; Potassium 3.9; Sodium 141   Lipid Panel No results found for: CHOL, TRIG, HDL, CHOLHDL, VLDL, LDLCALC, LDLDIRECT  Additional studies/ records that were reviewed today include:  Review of records from ER visit 09/23/2015    ASSESSMENT:    1. Atrial fibrillation, persistent ([place])   2. Syncope, unspecified syncope type   3. SOB (shortness of breath)   4. Memory loss      PLAN:  In order of problems listed above:  1. New onset atrial fibrillation - this has been occurring now for 2 months and currently anticoagulated with Pradaxa.  Her CHADS2VASC score is 3.  She is asymptomatic at present and rate controlled.  We discussed options for treatment including rate vs. Rhythm control.  Her daughter states that she has had a lot of fatigue but  hard to say whether this is from untreated OSA or afib.  I have recommended that we try to get her back in NSR and see if she has improvement in her fatigue.  She has not been very compliant with her Pradaxa and has missed doses.  I have stressed the importance of being compliant with her NOAC due to increased risk of cardioembolic events.  I will change her to Eliquis due to less bleeding risk and start at 5mg  BID.  I will  have my PA see her back in 4 weeks and if she has not missed any doses then will set her up for DCCV.  Check TSH and 2D echo to assess LA size.  2. Syncope - felt secondary to dehydration in the ER with normal workup.  Unlikely related to afib.  Will check a 2D echo to assess LVF.  Will get a 30 day heart monitor to rule out any significant bradycardia. 3. DOE - most likely secondary to afib.  Will check a 2D echo to assess LVF.   4. Fatigue - she has a history of OSA but did not want to use CPAP.  We discussed repeat sleep study but she is not sure that she wants to proceed and will think about it.      Medication Adjustments/Labs and Tests Ordered: Current medicines are reviewed at length with the patient today.  Concerns regarding medicines are outlined above.  Medication changes, Labs and Tests ordered today are listed in the Patient Instructions below. There are no Patient Instructions on file for this visit.   Lurena Nida, MD  12/08/2015 3:02 PM    [facility] [place], [place], [place]  [address] Phone: [phone]; Fax: [phone]

## 2015-12-10 ENCOUNTER — Telehealth: Payer: Self-pay | Admitting: Cardiology

## 2015-12-10 NOTE — Telephone Encounter (Signed)
Follow Up:   Returning call,she said she thought it was Dr Virgel Manifold pt's test results.

## 2015-12-10 NOTE — Telephone Encounter (Signed)
New message    Patient calling stating the nurse call her today - returning call back

## 2015-12-10 NOTE — Telephone Encounter (Signed)
-----   Message from Sueanne Margarita, MD sent at 12/09/2015  9:52 AM EDT ----- Please let patient know that labs were normal.  Continue current medical therapy.

## 2015-12-10 NOTE — Telephone Encounter (Signed)
Informed patient of results and verbal understanding expressed.  

## 2015-12-24 ENCOUNTER — Ambulatory Visit (HOSPITAL_COMMUNITY): Payer: Medicare Other | Attending: Cardiovascular Disease

## 2015-12-25 ENCOUNTER — Telehealth: Payer: Self-pay

## 2015-12-25 DIAGNOSIS — I4891 Unspecified atrial fibrillation: Secondary | ICD-10-CM

## 2015-12-25 NOTE — Telephone Encounter (Signed)
Patient missed her appointment for echo yesterday, because she thought her appointment was today. There is an opening over at [facility] at this time, due to a cancellation. Put in new order for echo, because previous echo not showing on the hospital's end. Patient was able to get appointment over at the hospital and is on her way now.

## 2015-12-30 ENCOUNTER — Ambulatory Visit (HOSPITAL_COMMUNITY)
Admission: RE | Admit: 2015-12-30 | Discharge: 2015-12-30 | Disposition: A | Discharge status: Home / Self Care | Payer: Medicare Other | Source: Ambulatory Visit | Attending: Cardiology | Admitting: Cardiology

## 2015-12-30 DIAGNOSIS — Z720 Tobacco use: Secondary | ICD-10-CM | POA: Diagnosis not present

## 2015-12-30 DIAGNOSIS — I340 Nonrheumatic mitral (valve) insufficiency: Secondary | ICD-10-CM | POA: Diagnosis not present

## 2015-12-30 DIAGNOSIS — I517 Cardiomegaly: Secondary | ICD-10-CM | POA: Diagnosis not present

## 2015-12-30 DIAGNOSIS — I4891 Unspecified atrial fibrillation: Secondary | ICD-10-CM | POA: Insufficient documentation

## 2015-12-30 NOTE — Progress Notes (Signed)
*  PRELIMINARY RESULTS* Echocardiogram 2D Echocardiogram has been performed.  Leavy Cella 12/30/2015, 2:00 PM

## 2016-01-02 ENCOUNTER — Other Ambulatory Visit (HOSPITAL_COMMUNITY): Payer: Medicare Other

## 2016-01-05 ENCOUNTER — Encounter: Payer: Self-pay | Admitting: Physician Assistant

## 2016-01-05 ENCOUNTER — Ambulatory Visit (INDEPENDENT_AMBULATORY_CARE_PROVIDER_SITE_OTHER): Payer: Medicare Other | Admitting: Physician Assistant

## 2016-01-05 ENCOUNTER — Encounter: Payer: Self-pay | Admitting: *Deleted

## 2016-01-05 VITALS — BP 130/90 | HR 62 | Ht 60.0 in | Wt 100.4 lb

## 2016-01-05 DIAGNOSIS — I4891 Unspecified atrial fibrillation: Secondary | ICD-10-CM | POA: Diagnosis not present

## 2016-01-05 DIAGNOSIS — G4733 Obstructive sleep apnea (adult) (pediatric): Secondary | ICD-10-CM

## 2016-01-05 DIAGNOSIS — R55 Syncope and collapse: Secondary | ICD-10-CM | POA: Diagnosis not present

## 2016-01-05 DIAGNOSIS — I481 Persistent atrial fibrillation: Secondary | ICD-10-CM

## 2016-01-05 DIAGNOSIS — I4819 Other persistent atrial fibrillation: Secondary | ICD-10-CM

## 2016-01-05 LAB — CBC
HEMATOCRIT: 40 % (ref 35.0–45.0)
HEMOGLOBIN: 13.5 g/dL (ref 11.7–15.5)
MCH: 30.1 pg (ref 27.0–33.0)
MCHC: 33.8 g/dL (ref 32.0–36.0)
MCV: 89.1 fL (ref 80.0–100.0)
MPV: 10.1 fL (ref 7.5–12.5)
Platelets: 414 10*3/uL — ABNORMAL HIGH (ref 140–400)
RBC: 4.49 MIL/uL (ref 3.80–5.10)
RDW: 13.5 % (ref 11.0–15.0)
WBC: 9.2 10*3/uL (ref 3.8–10.8)

## 2016-01-05 LAB — BASIC METABOLIC PANEL
BUN: 20 mg/dL (ref 7–25)
CHLORIDE: 101 mmol/L (ref 98–110)
CO2: 28 mmol/L (ref 20–31)
CREATININE: 0.59 mg/dL — AB (ref 0.60–0.88)
Calcium: 9.2 mg/dL (ref 8.6–10.4)
GLUCOSE: 78 mg/dL (ref 65–99)
POTASSIUM: 4.7 mmol/L (ref 3.5–5.3)
Sodium: 139 mmol/L (ref 135–146)

## 2016-01-05 LAB — APTT: APTT: 30 s (ref 24–37)

## 2016-01-05 NOTE — Progress Notes (Signed)
Cardiology Office Note:    Date:  01/05/2016   ID:  Linda Morse, DOB [birth year], MRN [id number]  PCP:  Tawanna Solo, MD  Cardiologist:  Dr. Fransico Him   Electrophysiologist:  n/a  Referring MD: Sueanne Margarita, MD   Chief Complaint  Patient presents with  . Atrial Fibrillation    Follow up    History of Present Illness:     Linda Morse is a 82 y.o. female with a hx of OSA (does not tolerate CPAP), dementia who was evaluated by Dr. Fransico Him 12/08/15 for AFib and syncope.  She was in exercise class at her church and became pale, diaphoretic and passed out.  She was doing neck exercises.  She went to the ED.  Had a normal workup except she was noted to be in AFib.  She was given IVFs in the ED for dehydration.  Patient was on Pradaxa but noted missing some doses.  Dr. Radford Pax changed her to Eliquis due to its better bleeding risk profile.  Echo was done and demonstrated normal LVEF.  Event monitor was to be arranged. But this was not done.   She returns today with an eye towards DCCV if she has been on Eliquis without interruption for 4 weeks.  Here with her daughter.  The patient and her daughter are not aware that she was to get an event monitor.  Review of the chart demonstrates that she did not have a monitor ordered.  She denies chest pain, significant dyspnea, edema, orthopnea, PND.  She continues to feel fatigued.   Past Medical History  Diagnosis Date  . Hypercholesterolemia   . Complication of anesthesia     takes 3- 4 days to wake up  . Arthritis   . Sleep apnea   . Memory loss   . Depression   . Atrial fibrillation, persistent ([place]) 12/08/2015  . Syncope 12/08/2015  . Fatigue 12/08/2015    Past Surgical History  Procedure Laterality Date  . Back surgery    . Knee surgery    . Eye surgery Bilateral     catarats  . Abdominal hysterectomy    . Orif wrist fracture Right 06/16/2013    Procedure: OPEN REDUCTION INTERNAL FIXATION (ORIF) RIGHT WRIST  FRACTURE ;  Surgeon: Roseanne Kaufman, MD;  Location: [facility];  Service: Orthopedics;  Laterality: Right;  . Neck surgery      Current Medications: Outpatient Prescriptions Prior to Visit  Medication Sig Dispense Refill  . alendronate (FOSAMAX) 70 MG tablet Take 70 mg by mouth once a week.     Marland Kitchen apixaban (ELIQUIS) 5 MG TABS tablet Take 1 tablet (5 mg total) by mouth 2 (two) times daily. 60 tablet 11  . Ascorbic Acid (VITAMIN C) 1000 MG tablet Take 1,000 mg by mouth daily.    . Cholecalciferol (D3-1000) 1000 UNITS capsule Take 1,000 Units by mouth daily.    . Cyanocobalamin (VITAMIN B-12) 1000 MCG SUBL Place 1,000 mcg under the tongue daily.    Marland Kitchen FLUoxetine (PROZAC) 20 MG capsule Take 20 mg by mouth daily.     . memantine (NAMENDA) 10 MG tablet Take 1 tablet (10 mg total) by mouth 2 (two) times daily. 60 tablet 11  . Multiple Vitamins-Minerals (ALIVE WOMENS GUMMY PO) Take 2 capsules by mouth daily.    Marland Kitchen pyridOXINE (VITAMIN B-6) 100 MG tablet Take 100 mg by mouth daily.    . solifenacin (VESICARE) 5 MG tablet Take 1 tablet (5 mg total) by  mouth daily. 30 tablet 6  . aspirin EC 81 MG tablet Take 81 mg by mouth daily. Reported on 01/05/2016     No facility-administered medications prior to visit.      Allergies:   Review of patient's allergies indicates no known allergies.   Social History   Social History  . Marital Status: Widowed    Spouse Name: [name]  . Number of Children: 3  . Years of Education: Some colle   Occupational History  . Retired    Social History Main Topics  . Smoking status: Current Every Day Smoker -- 1.00 packs/day for 57 years  . Smokeless tobacco: None  . Alcohol Use: No  . Drug Use: No  . Sexual Activity: Not Asked   Other Topics Concern  . None   Social History Narrative   Lives at home with her grandson.   Right-handed.   2 cups caffeine daily.     Family History:  The patient's family history includes Congestive Heart Failure in her mother;  Hypertension in her mother; Lung cancer in her father.   ROS:   Please see the history of present illness.    Review of Systems  Cardiovascular: Positive for irregular heartbeat.  Respiratory: Positive for snoring.   Neurological: Positive for loss of balance.   All other systems reviewed and are negative.   Physical Exam:    VS:  BP 130/90 mmHg  Pulse 62  Ht 5' (1.524 m)  Wt 100 lb 6.4 oz (45.541 kg)  BMI 19.61 kg/m2   GEN: Well nourished, well developed, in no acute distress HEENT: normal Neck: no JVD, no masses Cardiac: Normal S1/S2, irreg irreg rhythm; no murmurs, rubs, or gallops, no edema;   Respiratory:  clear to auscultation bilaterally; no wheezing, rhonchi or rales GI: soft, nontender, nondistended MS: no deformity or atrophy Skin: warm and dry Neuro: No focal deficits  Psych: Alert and oriented x 3, normal affect  Wt Readings from Last 3 Encounters:  01/05/16 100 lb 6.4 oz (45.541 kg)  12/08/15 100 lb 12.8 oz (45.723 kg)  09/10/15 102 lb (46.267 kg)      Studies/Labs Reviewed:     EKG:  EKG is  ordered today.  The ekg ordered today demonstrates AFib, HR 76  Recent Labs: 09/22/2015: BUN 19; Creatinine, Ser 0.72; Hemoglobin 13.5; Platelets 285; Potassium 3.9; Sodium 141 12/08/2015: TSH 1.21   Recent Lipid Panel No results found for: CHOL, TRIG, HDL, CHOLHDL, VLDL, LDLCALC, LDLDIRECT  Additional studies/ records that were reviewed today include:    Echo 12/30/15 EF 55-60%, indeterminate diastolic function, normal wall motion, MAC, trivial MR, mild LAE, normal RVEF  ASSESSMENT:     1. Persistent atrial fibrillation ([place])   2. Syncope, unspecified syncope type   3. OSA (obstructive sleep apnea)     PLAN:     In order of problems listed above:  1. Persistent AFib - She remains in AFib with controlled rate.  There is a question of whether she is symptomatic or not given her fatigue.  Plan was to proceed with DCCV after 1 month of uninterrupted  anticoagulation.  She thinks she missed 2-3 doses of Eliquis since last seen.  We discussed the importance of not missing any doses.  I reviewed her case with Dr. Dorris Carnes.  We will need to arrange TEE to exclude LAA thrombus.  Risks and benefits d/w patient and her daughter and she agrees to proceed.  If she has no change in her  symptoms in NSR, plan on rate control Rx in the future if she has return of AFib.   -  Arrange TEE-DCCV later this week.   -  FU with Dr. Fransico Him or me 2 weeks post DCCV.  2. Syncope - Recent Echo with normal LVEF.  Will review with Dr. Fransico Him regarding +/- Event Monitor.    3. OSA - Intol of CPAP.     Medication Adjustments/Labs and Tests Ordered: Current medicines are reviewed at length with the patient today.  Concerns regarding medicines are outlined above.  Medication changes, Labs and Tests ordered today are outlined in the Patient Instructions noted below. Patient Instructions  Medication Instructions:  Your physician recommends that you continue on your current medications as directed. Please refer to the Current Medication list given to you today. If you need a refill on your cardiac medications before your next appointment, please call your pharmacy. Labwork:  CBC BMET AND PT/PTT  Testing/Procedures:  SEE LETTER FOR TEE CARDIOVERSON ON 01/08/16 Follow-Up: 2 WEEKS POST DCCV AFTER 01/08/16.. Any Other Special Instructions Will Be Listed Below (If Applicable).   Signed, Richardson Dopp, PA-C  01/05/2016 3:27 PM    [facility] [place], [place], [place]  [address] Phone: [phone]; Fax: [phone]

## 2016-01-05 NOTE — Patient Instructions (Addendum)
Medication Instructions:  Your physician recommends that you continue on your current medications as directed. Please refer to the Current Medication list given to you today. If you need a refill on your cardiac medications before your next appointment, please call your pharmacy. Labwork:  CBC BMET AND PT/PTT  Testing/Procedures:  SEE LETTER FOR TEE CARDIOVERSON ON 01/08/16 Follow-Up: 2 WEEKS POST DCCV AFTER 01/08/16.. Any Other Special Instructions Will Be Listed Below (If Applicable).

## 2016-01-07 ENCOUNTER — Telehealth: Payer: Self-pay | Admitting: *Deleted

## 2016-01-07 NOTE — Telephone Encounter (Signed)
DPR to speak with daughter Jeanne Ivan and Moshe Salisbury. I lmtcb to go over results.

## 2016-01-08 ENCOUNTER — Encounter (HOSPITAL_COMMUNITY)
Admission: RE | Disposition: A | Discharge status: Home / Self Care | Payer: Self-pay | Source: Ambulatory Visit | Attending: Cardiology

## 2016-01-08 ENCOUNTER — Ambulatory Visit (HOSPITAL_COMMUNITY): Payer: Medicare Other | Admitting: Certified Registered"

## 2016-01-08 ENCOUNTER — Ambulatory Visit (HOSPITAL_BASED_OUTPATIENT_CLINIC_OR_DEPARTMENT_OTHER): Payer: Medicare Other

## 2016-01-08 ENCOUNTER — Encounter (HOSPITAL_COMMUNITY): Payer: Self-pay

## 2016-01-08 ENCOUNTER — Ambulatory Visit (HOSPITAL_COMMUNITY)
Admission: RE | Admit: 2016-01-08 | Discharge: 2016-01-08 | Disposition: A | Discharge status: Home / Self Care | Payer: Medicare Other | Source: Ambulatory Visit | Attending: Cardiology | Admitting: Cardiology

## 2016-01-08 DIAGNOSIS — I481 Persistent atrial fibrillation: Secondary | ICD-10-CM | POA: Insufficient documentation

## 2016-01-08 DIAGNOSIS — J449 Chronic obstructive pulmonary disease, unspecified: Secondary | ICD-10-CM | POA: Diagnosis not present

## 2016-01-08 DIAGNOSIS — Z7901 Long term (current) use of anticoagulants: Secondary | ICD-10-CM | POA: Diagnosis not present

## 2016-01-08 DIAGNOSIS — Z7982 Long term (current) use of aspirin: Secondary | ICD-10-CM | POA: Insufficient documentation

## 2016-01-08 DIAGNOSIS — I4891 Unspecified atrial fibrillation: Secondary | ICD-10-CM | POA: Diagnosis present

## 2016-01-08 DIAGNOSIS — G4733 Obstructive sleep apnea (adult) (pediatric): Secondary | ICD-10-CM | POA: Insufficient documentation

## 2016-01-08 DIAGNOSIS — F0390 Unspecified dementia without behavioral disturbance: Secondary | ICD-10-CM | POA: Diagnosis not present

## 2016-01-08 DIAGNOSIS — I4819 Other persistent atrial fibrillation: Secondary | ICD-10-CM | POA: Insufficient documentation

## 2016-01-08 DIAGNOSIS — F17200 Nicotine dependence, unspecified, uncomplicated: Secondary | ICD-10-CM | POA: Diagnosis not present

## 2016-01-08 HISTORY — PX: CARDIOVERSION: SHX1299

## 2016-01-08 HISTORY — PX: TEE WITHOUT CARDIOVERSION: SHX5443

## 2016-01-08 SURGERY — ECHOCARDIOGRAM, TRANSESOPHAGEAL
Anesthesia: Monitor Anesthesia Care

## 2016-01-08 MED ORDER — MIDAZOLAM HCL 5 MG/ML IJ SOLN
INTRAMUSCULAR | Status: AC
  Filled 2016-01-08: qty 2

## 2016-01-08 MED ORDER — FENTANYL CITRATE (PF) 100 MCG/2ML IJ SOLN
INTRAMUSCULAR | Status: AC
  Filled 2016-01-08: qty 2

## 2016-01-08 MED ORDER — HYDROCORTISONE 1 % EX CREA: 1.0000 "application " | TOPICAL_CREAM | Freq: Three times a day (TID) | CUTANEOUS | Status: DC | PRN

## 2016-01-08 MED ORDER — PROPOFOL 500 MG/50ML IV EMUL
INTRAVENOUS | Status: DC | PRN
  Administered 2016-01-08: 200 ug/kg/min via INTRAVENOUS

## 2016-01-08 MED ORDER — SODIUM CHLORIDE 0.9 % IV SOLN
INTRAVENOUS | Status: DC
  Administered 2016-01-08: 11:00:00 via INTRAVENOUS

## 2016-01-08 NOTE — Progress Notes (Signed)
Echocardiogram 2D Echocardiogram has been performed.  Linda Morse 01/08/2016, 11:50 AM

## 2016-01-08 NOTE — Anesthesia Preprocedure Evaluation (Signed)
Anesthesia Evaluation  Patient identified by MRN, date of birth, ID band Patient awake    Reviewed: Allergy & Precautions, NPO status , Patient's Chart, lab work & pertinent test results  Airway Mallampati: II  TM Distance: >3 FB Neck ROM: Full    Dental  (+) Teeth Intact, Dental Advisory Given, Caps,    Pulmonary sleep apnea , COPD, Current Smoker,    Pulmonary exam normal breath sounds clear to auscultation       Cardiovascular + dysrhythmias Atrial Fibrillation  Rhythm:Irregular Rate:Abnormal     Neuro/Psych Depression negative neurological ROS     GI/Hepatic negative GI ROS, Neg liver ROS,   Endo/Other  negative endocrine ROS  Renal/GU negative Renal ROS     Musculoskeletal negative musculoskeletal ROS (+)   Abdominal   Peds  Hematology negative hematology ROS (+)   Anesthesia Other Findings Day of surgery medications reviewed with the patient.  Reproductive/Obstetrics                             Anesthesia Physical Anesthesia Plan  ASA: III  Anesthesia Plan: MAC   Post-op Pain Management:    Induction: Intravenous  Airway Management Planned: Nasal Cannula  Additional Equipment:   Intra-op Plan:   Post-operative Plan:   Informed Consent: I have reviewed the patients History and Physical, chart, labs and discussed the procedure including the risks, benefits and alternatives for the proposed anesthesia with the patient or authorized representative who has indicated his/her understanding and acceptance.   Dental advisory given  Plan Discussed with: CRNA and Anesthesiologist  Anesthesia Plan Comments: (Discussed risks/benefits/alternatives to MAC sedation including need for ventilatory support, hypotension, need for conversion to general anesthesia.  All patient questions answered.  Patient/guardian wishes to proceed.)        Anesthesia Quick Evaluation

## 2016-01-08 NOTE — Interval H&P Note (Signed)
History and Physical Interval Note:  01/08/2016 10:11 AM  Linda Morse  has presented today for surgery, with the diagnosis of A FIB  The various methods of treatment have been discussed with the patient and family. After consideration of risks, benefits and other options for treatment, the patient has consented to  Procedure(s): TRANSESOPHAGEAL ECHOCARDIOGRAM (TEE) ([name]) CARDIOVERSION ([name]) as a surgical intervention .  The patient's history has been reviewed, patient examined, no change in status, stable for surgery.  I have reviewed the patient's chart and labs.  Questions were answered to the patient's satisfaction.     SKAINS, MARK

## 2016-01-08 NOTE — H&P (View-Only) (Signed)
Cardiology Office Note:    Date:  01/05/2016   ID:  [name], DOB 02/11/1934, MRN [id number]  PCP:  Tawanna Solo, MD  Cardiologist:  Dr. Fransico Him   Electrophysiologist:  n/a  Referring MD: Sueanne Margarita, MD   Chief Complaint  Patient presents with  . Atrial Fibrillation    Follow up    History of Present Illness:     Linda Morse is a 82 y.o. female with a hx of OSA (does not tolerate CPAP), dementia who was evaluated by Dr. Fransico Him 12/08/15 for AFib and syncope.  She was in exercise class at her church and became pale, diaphoretic and passed out.  She was doing neck exercises.  She went to the ED.  Had a normal workup except she was noted to be in AFib.  She was given IVFs in the ED for dehydration.  Patient was on Pradaxa but noted missing some doses.  Dr. Radford Pax changed her to Eliquis due to its better bleeding risk profile.  Echo was done and demonstrated normal LVEF.  Event monitor was to be arranged. But this was not done.   She returns today with an eye towards DCCV if she has been on Eliquis without interruption for 4 weeks.  Here with her daughter.  The patient and her daughter are not aware that she was to get an event monitor.  Review of the chart demonstrates that she did not have a monitor ordered.  She denies chest pain, significant dyspnea, edema, orthopnea, PND.  She continues to feel fatigued.   Past Medical History  Diagnosis Date  . Hypercholesterolemia   . Complication of anesthesia     takes 3- 4 days to wake up  . Arthritis   . Sleep apnea   . Memory loss   . Depression   . Atrial fibrillation, persistent ([place]) 12/08/2015  . Syncope 12/08/2015  . Fatigue 12/08/2015    Past Surgical History  Procedure Laterality Date  . Back surgery    . Knee surgery    . Eye surgery Bilateral     catarats  . Abdominal hysterectomy    . Orif wrist fracture Right 06/16/2013    Procedure: OPEN REDUCTION INTERNAL FIXATION (ORIF) RIGHT WRIST  FRACTURE ;  Surgeon: Roseanne Kaufman, MD;  Location: [facility];  Service: Orthopedics;  Laterality: Right;  . Neck surgery      Current Medications: Outpatient Prescriptions Prior to Visit  Medication Sig Dispense Refill  . alendronate (FOSAMAX) 70 MG tablet Take 70 mg by mouth once a week.     Marland Kitchen apixaban (ELIQUIS) 5 MG TABS tablet Take 1 tablet (5 mg total) by mouth 2 (two) times daily. 60 tablet 11  . Ascorbic Acid (VITAMIN C) 1000 MG tablet Take 1,000 mg by mouth daily.    . Cholecalciferol (D3-1000) 1000 UNITS capsule Take 1,000 Units by mouth daily.    . Cyanocobalamin (VITAMIN B-12) 1000 MCG SUBL Place 1,000 mcg under the tongue daily.    Marland Kitchen FLUoxetine (PROZAC) 20 MG capsule Take 20 mg by mouth daily.     . memantine (NAMENDA) 10 MG tablet Take 1 tablet (10 mg total) by mouth 2 (two) times daily. 60 tablet 11  . Multiple Vitamins-Minerals (ALIVE WOMENS GUMMY PO) Take 2 capsules by mouth daily.    Marland Kitchen pyridOXINE (VITAMIN B-6) 100 MG tablet Take 100 mg by mouth daily.    . solifenacin (VESICARE) 5 MG tablet Take 1 tablet (5 mg total) by  mouth daily. 30 tablet 6  . aspirin EC 81 MG tablet Take 81 mg by mouth daily. Reported on 01/05/2016     No facility-administered medications prior to visit.      Allergies:   Review of patient's allergies indicates no known allergies.   Social History   Social History  . Marital Status: Widowed    Spouse Name: [name]  . Number of Children: 3  . Years of Education: Some colle   Occupational History  . Retired    Social History Main Topics  . Smoking status: Current Every Day Smoker -- 1.00 packs/day for 57 years  . Smokeless tobacco: None  . Alcohol Use: No  . Drug Use: No  . Sexual Activity: Not Asked   Other Topics Concern  . None   Social History Narrative   Lives at home with her grandson.   Right-handed.   2 cups caffeine daily.     Family History:  The patient's family history includes Congestive Heart Failure in her mother;  Hypertension in her mother; Lung cancer in her father.   ROS:   Please see the history of present illness.    Review of Systems  Cardiovascular: Positive for irregular heartbeat.  Respiratory: Positive for snoring.   Neurological: Positive for loss of balance.   All other systems reviewed and are negative.   Physical Exam:    VS:  BP 130/90 mmHg  Pulse 62  Ht 5' (1.524 m)  Wt 100 lb 6.4 oz (45.541 kg)  BMI 19.61 kg/m2   GEN: Well nourished, well developed, in no acute distress HEENT: normal Neck: no JVD, no masses Cardiac: Normal S1/S2, irreg irreg rhythm; no murmurs, rubs, or gallops, no edema;   Respiratory:  clear to auscultation bilaterally; no wheezing, rhonchi or rales GI: soft, nontender, nondistended MS: no deformity or atrophy Skin: warm and dry Neuro: No focal deficits  Psych: Alert and oriented x 3, normal affect  Wt Readings from Last 3 Encounters:  01/05/16 100 lb 6.4 oz (45.541 kg)  12/08/15 100 lb 12.8 oz (45.723 kg)  09/10/15 102 lb (46.267 kg)      Studies/Labs Reviewed:     EKG:  EKG is  ordered today.  The ekg ordered today demonstrates AFib, HR 76  Recent Labs: 09/22/2015: BUN 19; Creatinine, Ser 0.72; Hemoglobin 13.5; Platelets 285; Potassium 3.9; Sodium 141 12/08/2015: TSH 1.21   Recent Lipid Panel No results found for: CHOL, TRIG, HDL, CHOLHDL, VLDL, LDLCALC, LDLDIRECT  Additional studies/ records that were reviewed today include:    Echo 12/30/15 EF 55-60%, indeterminate diastolic function, normal wall motion, MAC, trivial MR, mild LAE, normal RVEF  ASSESSMENT:     1. Persistent atrial fibrillation ([place])   2. Syncope, unspecified syncope type   3. OSA (obstructive sleep apnea)     PLAN:     In order of problems listed above:  1. Persistent AFib - She remains in AFib with controlled rate.  There is a question of whether she is symptomatic or not given her fatigue.  Plan was to proceed with DCCV after 1 month of uninterrupted  anticoagulation.  She thinks she missed 2-3 doses of Eliquis since last seen.  We discussed the importance of not missing any doses.  I reviewed her case with Dr. Dorris Carnes.  We will need to arrange TEE to exclude LAA thrombus.  Risks and benefits d/w patient and her daughter and she agrees to proceed.  If she has no change in her  symptoms in NSR, plan on rate control Rx in the future if she has return of AFib.   -  Arrange TEE-DCCV later this week.   -  FU with Dr. Fransico Him or me 2 weeks post DCCV.  2. Syncope - Recent Echo with normal LVEF.  Will review with Dr. Fransico Him regarding +/- Event Monitor.    3. OSA - Intol of CPAP.     Medication Adjustments/Labs and Tests Ordered: Current medicines are reviewed at length with the patient today.  Concerns regarding medicines are outlined above.  Medication changes, Labs and Tests ordered today are outlined in the Patient Instructions noted below. Patient Instructions  Medication Instructions:  Your physician recommends that you continue on your current medications as directed. Please refer to the Current Medication list given to you today. If you need a refill on your cardiac medications before your next appointment, please call your pharmacy. Labwork:  CBC BMET AND PT/PTT  Testing/Procedures:  SEE LETTER FOR TEE CARDIOVERSON ON 01/08/16 Follow-Up: 2 WEEKS POST DCCV AFTER 01/08/16.. Any Other Special Instructions Will Be Listed Below (If Applicable).   Signed, Richardson Dopp, PA-C  01/05/2016 3:27 PM    [facility] [place], [place], [place]  [address] Phone: [phone]; Fax: [phone]

## 2016-01-08 NOTE — CV Procedure (Signed)
    Electrical Cardioversion Procedure Note MEAGHEN THIESSE [id number] 01-14-1934  Procedure: Electrical Cardioversion Indications:  Atrial Fibrillation  Time Out: Verified patient identification, verified procedure,medications/allergies/relevent history reviewed, required imaging and test results available.  Performed  Procedure Details  The patient was NPO after midnight. Anesthesia was administered at the beside  by Dr. Gifford Shave with propofol.  Cardioversion was performed with synchronized biphasic defibrillation via AP pads with 120, 150 joules.  2 attempt(s) were performed.  The patient converted to normal sinus rhythm. Occasional PAC. The patient tolerated the procedure well   IMPRESSION:  Successful cardioversion of atrial fibrillation to NSR. On Eliquis.Discussed with family.     SKAINS, [place] 01/08/2016, 11:42 AM

## 2016-01-08 NOTE — Transfer of Care (Signed)
Immediate Anesthesia Transfer of Care Note  Patient: Linda Morse  Procedure(s) Performed: Procedure(s): TRANSESOPHAGEAL ECHOCARDIOGRAM (TEE) ([name]) CARDIOVERSION ([name])  Patient Location: Endoscopy Unit  Anesthesia Type:MAC  Level of Consciousness: awake, alert , oriented and patient cooperative  Airway & Oxygen Therapy: Patient Spontanous Breathing and Patient connected to nasal cannula oxygen  Post-op Assessment: Report given to RN, Post -op Vital signs reviewed and stable and Patient moving all extremities  Post vital signs: Reviewed and stable  Last Vitals:  Filed Vitals:   01/08/16 1042  BP: 170/73  Pulse: 69  Temp: 36.7 C  Resp: 19    Last Pain: There were no vitals filed for this visit.       Complications: No apparent anesthesia complications

## 2016-01-08 NOTE — Telephone Encounter (Signed)
DPR on file for daughter Linda Morse who has been notified of TEE results and lab results by phone with verbal understanding.

## 2016-01-08 NOTE — Anesthesia Postprocedure Evaluation (Signed)
Anesthesia Post Note  Patient: Linda Morse  Procedure(s) Performed: Procedure(s) (LRB): TRANSESOPHAGEAL ECHOCARDIOGRAM (TEE) ([name]) CARDIOVERSION ([name])  Patient location during evaluation: Endoscopy Anesthesia Type: MAC Level of consciousness: oriented and awake and alert Pain management: pain level controlled Vital Signs Assessment: post-procedure vital signs reviewed and stable Respiratory status: spontaneous breathing, respiratory function stable, nonlabored ventilation and patient connected to nasal cannula oxygen Cardiovascular status: blood pressure returned to baseline and stable Postop Assessment: no signs of nausea or vomiting    Last Vitals:  Filed Vitals:   01/08/16 1042  BP: 170/73  Pulse: 69  Temp: 36.7 C  Resp: 19    Last Pain: There were no vitals filed for this visit.               Myna Bright

## 2016-01-08 NOTE — Discharge Instructions (Signed)
Electrical Cardioversion, Care After °Refer to this sheet in the next few weeks. These instructions provide you with information on caring for yourself after your procedure. Your health care provider may also give you more specific instructions. Your treatment has been planned according to current medical practices, but problems sometimes occur. Call your health care provider if you have any problems or questions after your procedure. °WHAT TO EXPECT AFTER THE PROCEDURE °After your procedure, it is typical to have the following sensations: °· Some redness on the skin where the shocks were delivered. If this is tender, a sunburn lotion or hydrocortisone cream may help. °· Possible return of an abnormal heart rhythm within hours or days after the procedure. °HOME CARE INSTRUCTIONS °· Take medicines only as directed by your health care provider. Be sure you understand how and when to take your medicine. °· Learn how to feel your pulse and check it often. °· Limit your activity for 48 hours after the procedure or as directed by your health care provider. °· Avoid or minimize caffeine and other stimulants as directed by your health care provider. °SEEK MEDICAL CARE IF: °· You feel like your heart is beating too fast or your pulse is not regular. °· You have any questions about your medicines. °· You have bleeding that will not stop. °SEEK IMMEDIATE MEDICAL CARE IF: °· You are dizzy or feel faint. °· It is hard to breathe or you feel short of breath. °· There is a change in discomfort in your chest. °· Your speech is slurred or you have trouble moving an arm or leg on one side of your body. °· You get a serious muscle cramp that does not go away. °· Your fingers or toes turn cold or blue. °  °This information is not intended to replace advice given to you by your health care provider. Make sure you discuss any questions you have with your health care provider. °  °Document Released: 06/13/2013 Document Revised: 09/13/2014  Document Reviewed: 06/13/2013 °Elsevier Interactive Patient Education ©2016 [employer] ° °

## 2016-01-09 ENCOUNTER — Encounter (HOSPITAL_COMMUNITY): Payer: Self-pay | Admitting: Cardiology

## 2016-01-23 ENCOUNTER — Ambulatory Visit (INDEPENDENT_AMBULATORY_CARE_PROVIDER_SITE_OTHER): Payer: Medicare Other | Admitting: Podiatry

## 2016-01-23 ENCOUNTER — Encounter: Payer: Self-pay | Admitting: Podiatry

## 2016-01-23 VITALS — BP 131/68 | HR 63 | Resp 18

## 2016-01-23 DIAGNOSIS — B351 Tinea unguium: Secondary | ICD-10-CM

## 2016-01-23 DIAGNOSIS — M79674 Pain in right toe(s): Secondary | ICD-10-CM | POA: Diagnosis not present

## 2016-01-23 DIAGNOSIS — M79675 Pain in left toe(s): Secondary | ICD-10-CM | POA: Diagnosis not present

## 2016-01-23 NOTE — Progress Notes (Signed)
   Subjective:    Patient ID: Linda Morse, female    DOB: 08-08-1934, 82 y.o.   MRN: [id number]  HPI  82 year old female presents the office today for concerns of thick, painful, elongated toenails that she cannot trim herself. Denies any redness or swelling on the toenails. No other complaints at this time.    Review of Systems  All other systems reviewed and are negative.      Objective:   Physical Exam General: AAO x3, NAD  Dermatological: Nails are hypertrophic, dystrophic, brittle, discolored, elongated 10. There is no swelling redness or drainage. Tenderness to nails 1-5 bilaterally. No open lesions are present.  Vascular: Dorsalis Pedis artery and Posterior Tibial artery pedal pulses are 2/4 bilateral with immedate capillary fill time. Pedal hair growth present.  There is no pain with calf compression, swelling, warmth, erythema.   Neruologic: Grossly intact via light touch bilateral. Vibratory intact via tuning fork bilateral. Protective threshold with Semmes Wienstein monofilament intact to all pedal sites bilateral.   Musculoskeletal: No gross boney pedal deformities bilateral. No pain, crepitus, or limitation noted with foot and ankle range of motion bilateral. Muscular strength 5/5 in all groups tested bilateral.  Gait: Unassisted, Nonantalgic.       Assessment & Plan:  82 year old female with symptomatic onychomycosis -Treatment options discussed including all alternatives, risks, and complications -Etiology of symptoms were discussed -Nails debrided 10 without complications or bleeding. -Daily foot inspection -Follow-up in 3 months or sooner if any problems arise. In the meantime, encouraged to call the office with any questions, concerns, change in symptoms.   Celesta Gentile, DPM

## 2016-01-25 NOTE — Progress Notes (Signed)
Cardiology Office Note:    Date:  01/25/2016   ID:  Linda Morse, DOB June 14, 1934, MRN [id number]  PCP:  Tawanna Solo, MD  Cardiologist:  Dr. Fransico Him   Electrophysiologist:  n/a  Referring MD: Kathyrn Lass, MD   Chief Complaint  Patient presents with  . Atrial Fibrillation    s/p TEE-DCCV 01/08/16    History of Present Illness:     Linda Morse is a 82 y.o. female with a hx of OSA (does not tolerate CPAP), dementia who was evaluated by Dr. Fransico Him 12/08/15 for AFib and syncope. She was in exercise class at her church and became pale, diaphoretic and passed out. She was doing neck exercises. She went to the ED. Had a normal workup except she was noted to be in AFib. She was given IVFs in the ED for dehydration. Patient was on Pradaxa but noted missing some doses. Dr. Radford Pax changed her to Eliquis due to its better bleeding risk profile. Echo was done and demonstrated normal LVEF. Event monitor was to be arranged. But this was not done.   She returns today with an eye towards DCCV if she has been on Eliquis without interruption for 4 weeks. Here with her daughter. The patient and her daughter are not aware that she was to get an event monitor. Review of the chart demonstrates that she did not have a monitor ordered. She denies chest pain, significant dyspnea, edema, orthopnea, PND. She continues to feel fatigued.     Past Medical History  Diagnosis Date  . Hypercholesterolemia   . Complication of anesthesia     takes 3- 4 days to wake up  . Arthritis   . Sleep apnea   . Memory loss   . Depression   . Atrial fibrillation, persistent ([place]) 12/08/2015  . Syncope 12/08/2015  . Fatigue 12/08/2015    Past Surgical History  Procedure Laterality Date  . Back surgery    . Knee surgery    . Eye surgery Bilateral     catarats  . Abdominal hysterectomy    . Orif wrist fracture Right 06/16/2013    Procedure: OPEN REDUCTION INTERNAL FIXATION (ORIF) RIGHT  WRIST FRACTURE ;  Surgeon: Roseanne Kaufman, MD;  Location: [facility];  Service: Orthopedics;  Laterality: Right;  . Neck surgery    . Tee without cardioversion [name] 01/08/2016    Procedure: TRANSESOPHAGEAL ECHOCARDIOGRAM (TEE);  Surgeon: Jerline Pain, MD;  Location: [facility];  Service: Cardiovascular;  Laterality: [name];  . Cardioversion [name] 01/08/2016    Procedure: CARDIOVERSION;  Surgeon: Jerline Pain, MD;  Location: [facility];  Service: Cardiovascular;  Laterality: [name];    Current Medications: Outpatient Prescriptions Prior to Visit  Medication Sig Dispense Refill  . alendronate (FOSAMAX) 70 MG tablet Take 70 mg by mouth once a week.     Marland Kitchen apixaban (ELIQUIS) 5 MG TABS tablet Take 1 tablet (5 mg total) by mouth 2 (two) times daily. 60 tablet 11  . Ascorbic Acid (VITAMIN C) 1000 MG tablet Take 1,000 mg by mouth daily.    . Cholecalciferol (D3-1000) 1000 UNITS capsule Take 1,000 Units by mouth daily.    . Cyanocobalamin (VITAMIN B-12) 1000 MCG SUBL Place 1,000 mcg under the tongue daily.    Marland Kitchen FLUoxetine (PROZAC) 20 MG capsule Take 20 mg by mouth daily.     . memantine (NAMENDA) 10 MG tablet Take 1 tablet (10 mg total) by mouth 2 (two) times daily. 60 tablet 11  .  Multiple Vitamins-Minerals (ALIVE WOMENS GUMMY PO) Take 2 capsules by mouth daily.    Marland Kitchen pyridOXINE (VITAMIN B-6) 100 MG tablet Take 100 mg by mouth daily.    . solifenacin (VESICARE) 5 MG tablet Take 1 tablet (5 mg total) by mouth daily. 30 tablet 6   No facility-administered medications prior to visit.      Allergies:   Review of patient's allergies indicates no known allergies.   Social History   Social History  . Marital Status: Widowed    Spouse Name: [name]  . Number of Children: 3  . Years of Education: Some colle   Occupational History  . Retired    Social History Main Topics  . Smoking status: Current Every Day Smoker -- 1.00 packs/day for 57 years  . Smokeless tobacco: Not on file  . Alcohol Use: No  . Drug Use:  No  . Sexual Activity: Not on file   Other Topics Concern  . Not on file   Social History Narrative   Lives at home with her grandson.   Right-handed.   2 cups caffeine daily.     Family History:  The patient's family history includes Congestive Heart Failure in her mother; Hypertension in her mother; Lung cancer in her father.   ROS:   Please see the history of present illness.    ROS All other systems reviewed and are negative.   Physical Exam:    VS:  There were no vitals taken for this visit.   GEN: Well nourished, well developed, in no acute distress HEENT: normal Neck: no JVD, no masses Cardiac: Normal S1/S2, RRR; no murmurs, rubs, or gallops, no edema;   carotid bruits,   Respiratory:  clear to auscultation bilaterally; no wheezing, rhonchi or rales GI: soft, nontender, nondistended MS: no deformity or atrophy Skin: warm and dry Neuro: No focal deficits  Psych: Alert and oriented x 3, normal affect  Wt Readings from Last 3 Encounters:  01/08/16 100 lb (45.36 kg)  01/05/16 100 lb 6.4 oz (45.541 kg)  12/08/15 100 lb 12.8 oz (45.723 kg)      Studies/Labs Reviewed:     EKG:  EKG is  ordered today.  The ekg ordered today demonstrates   Recent Labs: 12/08/2015: TSH 1.21 01/05/2016: BUN 20; Creat 0.59*; Hemoglobin 13.5; Platelets 414*; Potassium 4.7; Sodium 139   Recent Lipid Panel No results found for: CHOL, TRIG, HDL, CHOLHDL, VLDL, LDLCALC, LDLDIRECT  Additional studies/ records that were reviewed today include:   TEE 01/08/16 - Left ventricle: Systolic function was normal. The estimated  ejection fraction was in the range of 60% to 65%. Wall motion was  normal; there were no regional wall motion abnormalities. - Aortic valve: There was trivial regurgitation. - Mitral valve: There was mild regurgitation. - Left atrium: No evidence of thrombus in the atrial cavity or  appendage. No evidence of thrombus in the atrial cavity or  appendage. No evidence of  thrombus in the appendage. - Right atrium: No evidence of thrombus in the atrial cavity or  appendage.  Echo 12/30/15 EF 55-60%, indeterminate diastolic function, normal wall motion, MAC, trivial MR, mild LAE, normal RVEF   ASSESSMENT:     No diagnosis found.  PLAN:     In order of problems listed above:  1. Persistent AFib - She remains in AFib with controlled rate. There is a question of whether she is symptomatic or not given her fatigue. Plan was to proceed with DCCV after 1 month of  uninterrupted anticoagulation. She thinks she missed 2-3 doses of Eliquis since last seen. We discussed the importance of not missing any doses. I reviewed her case with Dr. Dorris Carnes. We will need to arrange TEE to exclude LAA thrombus. Risks and benefits d/w patient and her daughter and she agrees to proceed. If she has no change in her symptoms in NSR, plan on rate control Rx in the future if she has return of AFib.  - Arrange TEE-DCCV later this week.  - FU with Dr. Fransico Him or me 2 weeks post DCCV.  2. Syncope - Recent Echo with normal LVEF. Will review with Dr. Fransico Him regarding +/- Event Monitor.   3. OSA - Intol of CPAP.    Medication Adjustments/Labs and Tests Ordered: Current medicines are reviewed at length with the patient today.  Concerns regarding medicines are outlined above.  Medication changes, Labs and Tests ordered today are outlined in the Patient Instructions noted below. There are no Patient Instructions on file for this visit. Signed, Richardson Dopp, PA-C  01/25/2016 9:53 PM    [facility] [place], [place],   [address] Phone: [phone]; Fax: [phone]     This encounter was created in error - please disregard.

## 2016-01-26 ENCOUNTER — Encounter: Payer: Medicare Other | Admitting: Physician Assistant

## 2016-01-27 ENCOUNTER — Inpatient Hospital Stay (HOSPITAL_COMMUNITY): Payer: Medicare Other

## 2016-01-27 ENCOUNTER — Emergency Department (HOSPITAL_COMMUNITY): Payer: Medicare Other

## 2016-01-27 ENCOUNTER — Encounter (HOSPITAL_COMMUNITY): Payer: Self-pay

## 2016-01-27 ENCOUNTER — Inpatient Hospital Stay (HOSPITAL_COMMUNITY)
Admission: EM | Admit: 2016-01-27 | Discharge: 2016-02-04 | DRG: 094 | Disposition: A | Discharge status: Home Health | Payer: Medicare Other | Attending: Internal Medicine | Admitting: Internal Medicine

## 2016-01-27 DIAGNOSIS — M810 Age-related osteoporosis without current pathological fracture: Secondary | ICD-10-CM | POA: Diagnosis present

## 2016-01-27 DIAGNOSIS — R4182 Altered mental status, unspecified: Secondary | ICD-10-CM | POA: Diagnosis present

## 2016-01-27 DIAGNOSIS — I481 Persistent atrial fibrillation: Secondary | ICD-10-CM | POA: Diagnosis present

## 2016-01-27 DIAGNOSIS — F419 Anxiety disorder, unspecified: Secondary | ICD-10-CM | POA: Diagnosis present

## 2016-01-27 DIAGNOSIS — Z9114 Patient's other noncompliance with medication regimen: Secondary | ICD-10-CM

## 2016-01-27 DIAGNOSIS — M1990 Unspecified osteoarthritis, unspecified site: Secondary | ICD-10-CM | POA: Diagnosis present

## 2016-01-27 DIAGNOSIS — E538 Deficiency of other specified B group vitamins: Secondary | ICD-10-CM | POA: Diagnosis present

## 2016-01-27 DIAGNOSIS — R519 Headache, unspecified: Secondary | ICD-10-CM

## 2016-01-27 DIAGNOSIS — G9340 Encephalopathy, unspecified: Secondary | ICD-10-CM | POA: Diagnosis present

## 2016-01-27 DIAGNOSIS — D181 Lymphangioma, any site: Secondary | ICD-10-CM | POA: Diagnosis present

## 2016-01-27 DIAGNOSIS — G4733 Obstructive sleep apnea (adult) (pediatric): Secondary | ICD-10-CM | POA: Diagnosis present

## 2016-01-27 DIAGNOSIS — J449 Chronic obstructive pulmonary disease, unspecified: Secondary | ICD-10-CM | POA: Diagnosis present

## 2016-01-27 DIAGNOSIS — F0390 Unspecified dementia without behavioral disturbance: Secondary | ICD-10-CM | POA: Diagnosis present

## 2016-01-27 DIAGNOSIS — I482 Chronic atrial fibrillation: Secondary | ICD-10-CM | POA: Diagnosis present

## 2016-01-27 DIAGNOSIS — W19XXXA Unspecified fall, initial encounter: Secondary | ICD-10-CM

## 2016-01-27 DIAGNOSIS — G0490 Encephalitis and encephalomyelitis, unspecified: Secondary | ICD-10-CM | POA: Diagnosis not present

## 2016-01-27 DIAGNOSIS — R1310 Dysphagia, unspecified: Secondary | ICD-10-CM | POA: Diagnosis present

## 2016-01-27 DIAGNOSIS — G009 Bacterial meningitis, unspecified: Secondary | ICD-10-CM | POA: Diagnosis present

## 2016-01-27 DIAGNOSIS — E876 Hypokalemia: Secondary | ICD-10-CM | POA: Diagnosis not present

## 2016-01-27 DIAGNOSIS — F17210 Nicotine dependence, cigarettes, uncomplicated: Secondary | ICD-10-CM | POA: Diagnosis present

## 2016-01-27 DIAGNOSIS — G9389 Other specified disorders of brain: Secondary | ICD-10-CM | POA: Diagnosis present

## 2016-01-27 DIAGNOSIS — M25522 Pain in left elbow: Secondary | ICD-10-CM

## 2016-01-27 DIAGNOSIS — Z7901 Long term (current) use of anticoagulants: Secondary | ICD-10-CM

## 2016-01-27 DIAGNOSIS — Z66 Do not resuscitate: Secondary | ICD-10-CM | POA: Diagnosis present

## 2016-01-27 DIAGNOSIS — F0391 Unspecified dementia with behavioral disturbance: Secondary | ICD-10-CM | POA: Diagnosis not present

## 2016-01-27 DIAGNOSIS — R51 Headache: Secondary | ICD-10-CM

## 2016-01-27 DIAGNOSIS — I10 Essential (primary) hypertension: Secondary | ICD-10-CM | POA: Diagnosis present

## 2016-01-27 DIAGNOSIS — G039 Meningitis, unspecified: Secondary | ICD-10-CM | POA: Diagnosis not present

## 2016-01-27 DIAGNOSIS — G319 Degenerative disease of nervous system, unspecified: Secondary | ICD-10-CM | POA: Diagnosis present

## 2016-01-27 DIAGNOSIS — R410 Disorientation, unspecified: Secondary | ICD-10-CM | POA: Diagnosis present

## 2016-01-27 DIAGNOSIS — E785 Hyperlipidemia, unspecified: Secondary | ICD-10-CM | POA: Diagnosis present

## 2016-01-27 DIAGNOSIS — D72829 Elevated white blood cell count, unspecified: Secondary | ICD-10-CM | POA: Diagnosis present

## 2016-01-27 DIAGNOSIS — I4819 Other persistent atrial fibrillation: Secondary | ICD-10-CM | POA: Diagnosis present

## 2016-01-27 HISTORY — DX: Unspecified dementia, unspecified severity, without behavioral disturbance, psychotic disturbance, mood disturbance, and anxiety: F03.90

## 2016-01-27 HISTORY — DX: Chronic obstructive pulmonary disease, unspecified: J44.9

## 2016-01-27 LAB — URINALYSIS, ROUTINE W REFLEX MICROSCOPIC
Bilirubin Urine: NEGATIVE
GLUCOSE, UA: NEGATIVE mg/dL
HGB URINE DIPSTICK: NEGATIVE
Ketones, ur: NEGATIVE mg/dL
LEUKOCYTES UA: NEGATIVE
Nitrite: NEGATIVE
Protein, ur: NEGATIVE mg/dL
SPECIFIC GRAVITY, URINE: 1.015 (ref 1.005–1.030)
pH: 7 (ref 5.0–8.0)

## 2016-01-27 LAB — PROTIME-INR
INR: 1.17 (ref 0.00–1.49)
INR: 1.09 (ref 0.00–1.49)
PROTHROMBIN TIME: 15 s (ref 11.6–15.2)
Prothrombin Time: 14.3 seconds (ref 11.6–15.2)

## 2016-01-27 LAB — TSH: TSH: 1.268 u[IU]/mL (ref 0.350–4.500)

## 2016-01-27 LAB — CBC
HCT: 39.4 % (ref 36.0–46.0)
HEMOGLOBIN: 13 g/dL (ref 12.0–15.0)
MCH: 29.8 pg (ref 26.0–34.0)
MCHC: 33 g/dL (ref 30.0–36.0)
MCV: 90.4 fL (ref 78.0–100.0)
Platelets: 345 10*3/uL (ref 150–400)
RBC: 4.36 MIL/uL (ref 3.87–5.11)
RDW: 12.8 % (ref 11.5–15.5)
WBC: 14.8 10*3/uL — ABNORMAL HIGH (ref 4.0–10.5)

## 2016-01-27 LAB — COMPREHENSIVE METABOLIC PANEL
ALT: 16 U/L (ref 14–54)
ANION GAP: 9 (ref 5–15)
AST: 26 U/L (ref 15–41)
Albumin: 4.1 g/dL (ref 3.5–5.0)
Alkaline Phosphatase: 86 U/L (ref 38–126)
BUN: 12 mg/dL (ref 6–20)
CHLORIDE: 103 mmol/L (ref 101–111)
CO2: 26 mmol/L (ref 22–32)
Calcium: 9.5 mg/dL (ref 8.9–10.3)
Creatinine, Ser: 0.68 mg/dL (ref 0.44–1.00)
GFR calc non Af Amer: >60 mL/min (ref >60)
Glucose, Bld: 101 mg/dL — ABNORMAL HIGH (ref 65–99)
Potassium: 3.8 mmol/L (ref 3.5–5.1)
SODIUM: 138 mmol/L (ref 135–145)
Total Bilirubin: 0.6 mg/dL (ref 0.3–1.2)
Total Protein: 6.6 g/dL (ref 6.5–8.1)

## 2016-01-27 LAB — I-STAT ARTERIAL BLOOD GAS, ED
ACID-BASE EXCESS: 2 mmol/L (ref 0.0–2.0)
Bicarbonate: 25.8 mEq/L — ABNORMAL HIGH (ref 20.0–24.0)
O2 Saturation: 86 %
PH ART: 7.45 (ref 7.350–7.450)
PO2 ART: 51 mmHg — AB (ref 80.0–100.0)
TCO2: 27 mmol/L (ref 0–100)
pCO2 arterial: 37.3 mmHg (ref 35.0–45.0)

## 2016-01-27 LAB — I-STAT TROPONIN, ED: TROPONIN I, POC: 0 ng/mL (ref 0.00–0.08)

## 2016-01-27 LAB — AMMONIA: AMMONIA: 18 umol/L (ref 9–35)

## 2016-01-27 LAB — VITAMIN B12: Vitamin B-12: 282 pg/mL (ref 180–914)

## 2016-01-27 MED ORDER — ONDANSETRON HCL 4 MG/2ML IJ SOLN: 4.0000 mg | Freq: Four times a day (QID) | INTRAMUSCULAR | Status: DC | PRN

## 2016-01-27 MED ORDER — APIXABAN 5 MG PO TABS
5.0000 mg | ORAL_TABLET | Freq: Two times a day (BID) | ORAL | Status: DC
  Filled 2016-01-27: qty 1

## 2016-01-27 MED ORDER — ACETAMINOPHEN 650 MG RE SUPP
650.0000 mg | Freq: Four times a day (QID) | RECTAL | Status: DC | PRN
  Administered 2016-01-28: 650 mg via RECTAL
  Filled 2016-01-27: qty 1

## 2016-01-27 MED ORDER — ONDANSETRON HCL 4 MG PO TABS: 4.0000 mg | ORAL_TABLET | Freq: Four times a day (QID) | ORAL | Status: DC | PRN

## 2016-01-27 MED ORDER — LORAZEPAM 2 MG/ML IJ SOLN
1.0000 mg | Freq: Once | INTRAMUSCULAR | Status: AC
  Administered 2016-01-27: 1 mg via INTRAVENOUS
  Filled 2016-01-27: qty 1

## 2016-01-27 MED ORDER — HYDRALAZINE HCL 20 MG/ML IJ SOLN: 10.0000 mg | Freq: Four times a day (QID) | INTRAMUSCULAR | Status: DC | PRN

## 2016-01-27 MED ORDER — DARIFENACIN HYDROBROMIDE ER 7.5 MG PO TB24
7.5000 mg | ORAL_TABLET | Freq: Every day | ORAL | Status: DC
  Administered 2016-01-30 – 2016-02-04 (×6): 7.5 mg via ORAL
  Filled 2016-01-27 (×8): qty 1

## 2016-01-27 MED ORDER — HALOPERIDOL LACTATE 5 MG/ML IJ SOLN
2.0000 mg | Freq: Four times a day (QID) | INTRAMUSCULAR | Status: DC | PRN
  Administered 2016-01-27 – 2016-02-03 (×3): 2 mg via INTRAVENOUS
  Filled 2016-01-27 (×3): qty 1

## 2016-01-27 MED ORDER — VITAMIN B-12 1000 MCG PO TABS
1000.0000 ug | ORAL_TABLET | Freq: Every day | ORAL | Status: DC
  Filled 2016-01-27 (×2): qty 10

## 2016-01-27 MED ORDER — ACETAMINOPHEN 325 MG PO TABS
650.0000 mg | ORAL_TABLET | Freq: Four times a day (QID) | ORAL | Status: DC | PRN
  Administered 2016-02-02: 650 mg via ORAL
  Filled 2016-01-27: qty 2

## 2016-01-27 MED ORDER — MEMANTINE HCL 10 MG PO TABS: 10.0000 mg | ORAL_TABLET | Freq: Two times a day (BID) | ORAL | Status: DC

## 2016-01-27 MED ORDER — VITAMIN B-6 100 MG PO TABS
100.0000 mg | ORAL_TABLET | Freq: Every day | ORAL | Status: DC
Start: 2016-01-27 — End: 2016-01-28
  Filled 2016-01-27 (×2): qty 1

## 2016-01-27 MED ORDER — VITAMIN D 1000 UNITS PO TABS
1000.0000 [IU] | ORAL_TABLET | Freq: Every day | ORAL | Status: DC
  Filled 2016-01-27: qty 1

## 2016-01-27 NOTE — Consult Note (Signed)
Admission H&P    Chief Complaint: Encephalopathic state of unclear etiology.  HPI: Linda Morse is an 82 y.o. female with a history of hypercholesterolemia, dementia, sleep apnea, arthritis, and atrial fibrillation and Eliquis, brought to the emergency room where evaluation following change in mental status. Patient was last known well at 8 AM this morning. She was found for at 8:20 AM, confused and having fallen. She had a subsequent fall as well. She has remained confused and was agitated on arrival in the ED. No focal deficits were noted. She was given Ativan and subsequently Haldol blood sugar was normal 1. prior to an MRI study. She has remained somewhat somnolen,t but agitated on arousal. MRI of the brain showed advanced atrophy, but no acute findings. She had a low-grade fever on arrival with temperature of 99.7 rectally. Urinalysis was unremarkable. Mild leukocytosis was noted. Electrolytes are unremarkable.  Past Medical History  Diagnosis Date  . Hypercholesterolemia   . Complication of anesthesia     takes 3- 4 days to wake up  . Arthritis   . Sleep apnea   . Memory loss   . Depression   . Atrial fibrillation, persistent ([place]) 12/08/2015  . Syncope 12/08/2015  . Fatigue 12/08/2015    Past Surgical History  Procedure Laterality Date  . Back surgery    . Knee surgery    . Eye surgery Bilateral     catarats  . Abdominal hysterectomy    . Orif wrist fracture Right 06/16/2013    Procedure: OPEN REDUCTION INTERNAL FIXATION (ORIF) RIGHT WRIST FRACTURE ;  Surgeon: Roseanne Kaufman, MD;  Location: [place];  Service: Orthopedics;  Laterality: Right;  . Neck surgery    . Tee without cardioversion [name] 01/08/2016    Procedure: TRANSESOPHAGEAL ECHOCARDIOGRAM (TEE);  Surgeon: Jerline Pain, MD;  Location: [facility];  Service: Cardiovascular;  Laterality: [name];  . Cardioversion [name] 01/08/2016    Procedure: CARDIOVERSION;  Surgeon: Jerline Pain, MD;  Location: [facility] ENDOSCOPY;  Service:  Cardiovascular;  Laterality: [name];    Family History  Problem Relation Age of Onset  . Hypertension Mother   . Lung cancer Father   . Congestive Heart Failure Mother    Social History:  reports that she has been smoking.  She does not have any smokeless tobacco history on file. She reports that she does not drink alcohol or use illicit drugs.  Allergies: No Known Allergies  Medications Prior to Admission  Medication Sig Dispense Refill  . apixaban (ELIQUIS) 5 MG TABS tablet Take 1 tablet (5 mg total) by mouth 2 (two) times daily. 60 tablet 11  . memantine (NAMENDA) 10 MG tablet Take 1 tablet (10 mg total) by mouth 2 (two) times daily. 60 tablet 11  . solifenacin (VESICARE) 5 MG tablet Take 1 tablet (5 mg total) by mouth daily. 30 tablet 6  . alendronate (FOSAMAX) 70 MG tablet Take 70 mg by mouth once a week.     . Ascorbic Acid (VITAMIN C) 1000 MG tablet Take 1,000 mg by mouth daily.    . Cholecalciferol (D3-1000) 1000 UNITS capsule Take 1,000 Units by mouth daily.    . Cyanocobalamin (VITAMIN B-12) 1000 MCG SUBL Place 1,000 mcg under the tongue daily.    Marland Kitchen FLUoxetine (PROZAC) 20 MG capsule Take 20 mg by mouth daily.     . Multiple Vitamins-Minerals (ALIVE WOMENS GUMMY PO) Take 2 capsules by mouth daily.    Marland Kitchen pyridOXINE (VITAMIN B-6) 100 MG tablet Take 100 mg  by mouth daily.      ROS: Unavailable due to patient's current mental status.  Physical Examination: Blood pressure 134/69, pulse 65, temperature 99.3 F (37.4 C), temperature source Oral, resp. rate 18, height 5' 3" (1.6 m), weight 43.9 kg (96 lb 12.5 oz), SpO2 94 %.  HEENT-  Normocephalic, no lesions, without obvious abnormality.  Normal external eye and conjunctiva.  Normal TM's bilaterally.  Normal auditory canals and external ears. Normal external nose, mucus membranes and septum.  Normal pharynx. Neck supple with no masses, nodes, nodules or enlargement. Cardiovascular - regular rate and rhythm, S1, S2 normal, no murmur,  click, rub or gallop Lungs - chest clear, no wheezing, rales, normal symmetric air entry Abdomen - soft, non-tender; bowel sounds normal; no masses,  no organomegaly Extremities - no joint deformities, effusion, or inflammation  Neurologic Examination: Patient was somnolent but could be aroused. She was disoriented to time as well as place. She was moderately agitated when aroused. Pupils were equal and reacted normally to light. Extraocular movements were intact with oculocephalic maneuvers. Face was symmetrical with no focal weakness. Muscle tone was increased diffusely and was symmetrical. She moved extremities equally with symmetrical strength throughout. Deep tendon reflexes were 2+ and symmetrical. Plantar responses were mute. She had moderate frontal release signs.  Results for orders placed or performed during the hospital encounter of 01/27/16 (from the past 48 hour(s))  Urinalysis, Routine w reflex microscopic (not at [facility])     Status: None   Collection Time: 01/27/16  2:25 PM  Result Value Ref Range   Color, Urine YELLOW YELLOW   APPearance CLEAR CLEAR   Specific Gravity, Urine 1.015 1.005 - 1.030   pH 7.0 5.0 - 8.0   Glucose, UA NEGATIVE NEGATIVE mg/dL   Hgb urine dipstick NEGATIVE NEGATIVE   Bilirubin Urine NEGATIVE NEGATIVE   Ketones, ur NEGATIVE NEGATIVE mg/dL   Protein, ur NEGATIVE NEGATIVE mg/dL   Nitrite NEGATIVE NEGATIVE   Leukocytes, UA NEGATIVE NEGATIVE    Comment: MICROSCOPIC NOT DONE ON URINES WITH NEGATIVE PROTEIN, BLOOD, LEUKOCYTES, NITRITE, OR GLUCOSE <1000 mg/dL.  Comprehensive metabolic panel     Status: Abnormal   Collection Time: 01/27/16  2:46 PM  Result Value Ref Range   Sodium 138 135 - 145 mmol/L   Potassium 3.8 3.5 - 5.1 mmol/L   Chloride 103 101 - 111 mmol/L   CO2 26 22 - 32 mmol/L   Glucose, Bld 101 (H) 65 - 99 mg/dL   BUN 12 6 - 20 mg/dL   Creatinine, Ser 0.68 0.44 - 1.00 mg/dL   Calcium 9.5 8.9 - 10.3 mg/dL   Total Protein 6.6 6.5 - 8.1  g/dL   Albumin 4.1 3.5 - 5.0 g/dL   AST 26 15 - 41 U/L   ALT 16 14 - 54 U/L   Alkaline Phosphatase 86 38 - 126 U/L   Total Bilirubin 0.6 0.3 - 1.2 mg/dL   GFR calc non Af Amer >60 >60 mL/min   GFR calc Af Amer >60 >60 mL/min    Comment: (NOTE) The eGFR has been calculated using the CKD EPI equation. This calculation has not been validated in all clinical situations. eGFR's persistently <60 mL/min signify possible Chronic Kidney Disease.    Anion gap 9 5 - 15  CBC     Status: Abnormal   Collection Time: 01/27/16  2:46 PM  Result Value Ref Range   WBC 14.8 (H) 4.0 - 10.5 K/uL   RBC 4.36 3.87 - 5.11  MIL/uL   Hemoglobin 13.0 12.0 - 15.0 g/dL   HCT 39.4 36.0 - 46.0 %   MCV 90.4 78.0 - 100.0 fL   MCH 29.8 26.0 - 34.0 pg   MCHC 33.0 30.0 - 36.0 g/dL   RDW 12.8 11.5 - 15.5 %   Platelets 345 150 - 400 K/uL  Protime-INR     Status: None   Collection Time: 01/27/16  2:46 PM  Result Value Ref Range   Prothrombin Time 15.0 11.6 - 15.2 seconds   INR 1.17 0.00 - 1.49  I-Stat Troponin, ED (not at [facility])     Status: None   Collection Time: 01/27/16  2:52 PM  Result Value Ref Range   Troponin i, poc 0.00 0.00 - 0.08 ng/mL   Comment 3            Comment: Due to the release kinetics of cTnI, a negative result within the first hours of the onset of symptoms does not rule out myocardial infarction with certainty. If myocardial infarction is still suspected, repeat the test at appropriate intervals.   I-Stat arterial blood gas, ED     Status: Abnormal   Collection Time: 01/27/16  5:49 PM  Result Value Ref Range   pH, Arterial 7.450 7.350 - 7.450   pCO2 arterial 37.3 35.0 - 45.0 mmHg   pO2, Arterial 51.0 (L) 80.0 - 100.0 mmHg   Bicarbonate 25.8 (H) 20.0 - 24.0 mEq/L   TCO2 27 0 - 100 mmol/L   O2 Saturation 86.0 %   Acid-Base Excess 2.0 0.0 - 2.0 mmol/L   Patient temperature 99.7 F    Collection site RADIAL, [facility] TEST ACCEPTABLE    Drawn by Operator    Sample type ARTERIAL    Dg  Chest 1 View  01/27/2016  CLINICAL DATA:  2 falls since last night.  Altered mental status. EXAM: CHEST 1 VIEW COMPARISON:  Two-view chest x-ray 09/22/2015. FINDINGS: The heart size is normal. Atherosclerotic calcifications are present at the aortic arch. Mild interstitial coarsening is stable. No focal superimposed disease is present. The visualized soft tissues and bony thorax are unremarkable. IMPRESSION: 1. No acute cardiopulmonary disease. 2. Emphysema. 3. Atherosclerosis of the thoracic aorta. Electronically Signed   By: San Morelle M.D.   On: 01/27/2016 16:15   Ct Head Wo Contrast  01/27/2016  CLINICAL DATA:  Altered mental status beginning last night with 2 falls. Initial encounter. EXAM: CT HEAD WITHOUT CONTRAST CT CERVICAL SPINE WITHOUT CONTRAST TECHNIQUE: Multidetector CT imaging of the head and cervical spine was performed following the standard protocol without intravenous contrast. Multiplanar CT image reconstructions of the cervical spine were also generated. COMPARISON:  Brain and cervical spine MRI 05/08/2015 FINDINGS: CT HEAD FINDINGS There is moderate cerebral atrophy which is unchanged. Lateral and third ventricular enlargement is unchanged and favored to be due to cerebral atrophy. Periventricular and subcortical cerebral white matter hypodensities are similar to the T2 hyperintensities on the prior MRI and are nonspecific but compatible with moderate chronic small vessel ischemic disease. There is no evidence of acute cortical infarct, intracranial hemorrhage, mass, midline shift, or extra-axial fluid collection. Prior bilateral cataract extraction is noted. There is partial opacification of a single posterior right ethmoid air cell. No significant mastoid fluid. No skull fracture. Calcified atherosclerosis at the skullbase. CT CERVICAL SPINE FINDINGS Slight anterolisthesis of C7 on T1 is unchanged and likely degenerative, with advanced right-sided facet arthrosis at this level and  mild-to-moderate disc space narrowing. Sequelae of C4-C7 anterior fusion  are again identified, with prior corpectomy at C5. Solid osseous anterior fusion is present from C4-C7. There is also evidence of facet ankylosis bilaterally from C4-C7. Left-sided facet arthrosis is severe at C2-3 and moderate at C3-4. No acute cervical spine fracture is identified. Nuchal ligament calcification is noted at C5-6. C1-2 degenerative changes are present. Paraspinal soft tissues are unremarkable. IMPRESSION: 1. No evidence of acute intracranial abnormality. 2. Moderate cerebral atrophy and chronic small vessel ischemic disease. 3. No acute osseous abnormality identified in the cervical spine. Prior C4-C7 fusion. Electronically Signed   By: Logan Bores M.D.   On: 01/27/2016 16:35   Ct Cervical Spine Wo Contrast  01/27/2016  CLINICAL DATA:  Altered mental status beginning last night with 2 falls. Initial encounter. EXAM: CT HEAD WITHOUT CONTRAST CT CERVICAL SPINE WITHOUT CONTRAST TECHNIQUE: Multidetector CT imaging of the head and cervical spine was performed following the standard protocol without intravenous contrast. Multiplanar CT image reconstructions of the cervical spine were also generated. COMPARISON:  Brain and cervical spine MRI 05/08/2015 FINDINGS: CT HEAD FINDINGS There is moderate cerebral atrophy which is unchanged. Lateral and third ventricular enlargement is unchanged and favored to be due to cerebral atrophy. Periventricular and subcortical cerebral white matter hypodensities are similar to the T2 hyperintensities on the prior MRI and are nonspecific but compatible with moderate chronic small vessel ischemic disease. There is no evidence of acute cortical infarct, intracranial hemorrhage, mass, midline shift, or extra-axial fluid collection. Prior bilateral cataract extraction is noted. There is partial opacification of a single posterior right ethmoid air cell. No significant mastoid fluid. No skull fracture.  Calcified atherosclerosis at the skullbase. CT CERVICAL SPINE FINDINGS Slight anterolisthesis of C7 on T1 is unchanged and likely degenerative, with advanced right-sided facet arthrosis at this level and mild-to-moderate disc space narrowing. Sequelae of C4-C7 anterior fusion are again identified, with prior corpectomy at C5. Solid osseous anterior fusion is present from C4-C7. There is also evidence of facet ankylosis bilaterally from C4-C7. Left-sided facet arthrosis is severe at C2-3 and moderate at C3-4. No acute cervical spine fracture is identified. Nuchal ligament calcification is noted at C5-6. C1-2 degenerative changes are present. Paraspinal soft tissues are unremarkable. IMPRESSION: 1. No evidence of acute intracranial abnormality. 2. Moderate cerebral atrophy and chronic small vessel ischemic disease. 3. No acute osseous abnormality identified in the cervical spine. Prior C4-C7 fusion. Electronically Signed   By: Logan Bores M.D.   On: 01/27/2016 16:35   Mr Brain Wo Contrast  01/27/2016  CLINICAL DATA:  Acute encephalopathy. EXAM: MRI HEAD WITHOUT CONTRAST TECHNIQUE: Multiplanar, multiecho pulse sequences of the brain and surrounding structures were obtained without intravenous contrast. COMPARISON:  CT head 01/27/2016 FINDINGS: Advanced atrophy with diffuse cortical volume loss. Moderate ventricular enlargement involving the third and lateral ventricles. Lateral ventricles have a rounded appearance which could be due to hydrocephalus. On sagittal imaging, the proximal aqueduct is dilated. The fourth ventricle does not appear to be dilated. Possible obstructive hydrocephalus. In addition, there is periventricular white matter hyperintensity which could be transependymal absorption of CSF versus chronic ischemia. Subdural hygroma lateral to the right hemispheric medial to the left hemisphere. Negative for intracranial hemorrhage. Negative for acute infarct. Small chronic infarct left thalamus.   Brainstem normal in signal. IMPRESSION: Advanced atrophy Enlargement of the third and lateral ventricles with a configuration that could represent obstructive hydrocephalus. Correlate with symptoms Negative for acute infarct. Electronically Signed   By: Franchot Gallo M.D.   On: 01/27/2016 19:13  Dg Abd Portable 1v  01/27/2016  CLINICAL DATA:  Acute onset of altered mental status and multiple falls. Question of abdominal pain. Initial encounter. EXAM: PORTABLE ABDOMEN - 1 VIEW COMPARISON:  Lumbar spine radiographs performed 04/06/2010 FINDINGS: The visualized bowel gas pattern is unremarkable. Scattered air and stool filled loops of colon are seen; no abnormal dilatation of small bowel loops is seen to suggest small bowel obstruction. No free intra-abdominal air is identified, though evaluation for free air is limited on a single supine view. An The patient is status post lumbar spinal fusion at L4-L5; the sacroiliac joints are unremarkable in appearance. The visualized lung bases are essentially clear. IMPRESSION: Unremarkable bowel gas pattern; no free intra-abdominal air seen. Small amount of stool noted in the colon. Electronically Signed   By: Garald Balding M.D.   On: 01/27/2016 18:20    Assessment/Plan 82 year old lady with a history of dementia as well as upper lip anemia and atrial fibrillation on ELiQUIS presenting with altered mental status with acute encephalopathic state. Allergy is unclear. She had low-grade fever on initial presentation. Significance of leukocytosis and slightly elevated in per tear is unclear. Patient has no focal deficits. Stroke is unlikely but cannot be completely ruled out at this point. New-onset seizure disorder with postictal state also cannot be ruled out at this point.  Recommendations: 1. MRI of the brain without contrast 2. EEG, routine adult study as planned 3. Minimize sedating psychoactive medications and management of agitation  We will continue to  follow this patient with you.  C.R. Nicole Kindred, [facility] [phone]  01/27/2016, 8:25 PM

## 2016-01-27 NOTE — Progress Notes (Signed)
ABG ordered for patient however patient was difficult stick due to increased agitation, having to have RN and family members hold her down.  Venous sample was obtained and given to RN.  Will continue to monitor.    Ref. Range 01/27/2016 17:49  Sample type Unknown ARTERIAL  pH, Arterial Latest Ref Range: 7.350-7.450  7.450  pCO2 arterial Latest Ref Range: 35.0-45.0 mmHg 37.3  pO2, Arterial Latest Ref Range: 80.0-100.0 mmHg 51.0 (L)  Bicarbonate Latest Ref Range: 20.0-24.0 mEq/L 25.8 (H)  TCO2 Latest Ref Range: 0-100 mmol/L 27  Acid-Base Excess Latest Ref Range: 0.0-2.0 mmol/L 2.0  O2 Saturation Latest Units: % 86.0  Patient temperature Unknown 99.7 F  Collection site Unknown RADIAL, [facility]EN'[facility] T.Marland KitchenMarland Kitchen

## 2016-01-27 NOTE — ED Notes (Signed)
Pt brought in EMS for AMS that began last night.  Pt lives at home with grandson and family reports two falls since last night and pt being "altered" since last night.  Pt has no marked weakness and is able to follow commands.  EMS reports periods of confusion such as reaching for things in the air and attempting to grab into Paramedics pockets.

## 2016-01-27 NOTE — ED Provider Notes (Signed)
CSN: [id number]     Arrival date & time 01/27/16  1358 History   First MD Initiated Contact with Patient 01/27/16 1404     Chief Complaint  Patient presents with  . Altered Mental Status   LEVEL V CAVEAT DUE TO AMS  HPI  Linda Morse is an 82 year old female with PMHx of a fib on eliquis, dementia, anxiety presenting with altered mental status. Patient's daughters are at bedside and provides the history. They state she has been more agitated over the past few weeks. She states she is physically agitated and seems to be trying to get comfortable. Her daughter said that she has urinary incontinence at baseline but seems to be having larger volume of urine. They also noted 2 episodes of fecal incontinence which is unusual for her. The daughter states that she woke in the middle the night to urinate and fell. She believes she struck her head when she did so. They report acute worsening of altered mental status and agitation since. The daughter stated that she then fell again before calling EMS. They do not believe there was loss of consciousness with the fall. Patient does have A. fib and is supposed take eliquis but the daughter states that she is noncompliant with her medications. She also notes that she does not take her dementia medicine as prescribed. They deny new additions to her medications. Patient continues to state that she has no complaints and feels fine.  Past Medical History  Diagnosis Date  . Hypercholesterolemia   . Complication of anesthesia     takes 3- 4 days to wake up  . Arthritis   . Sleep apnea   . Memory loss   . Depression   . Atrial fibrillation, persistent ([place]) 12/08/2015  . Syncope 12/08/2015  . Fatigue 12/08/2015   Past Surgical History  Procedure Laterality Date  . Back surgery    . Knee surgery    . Eye surgery Bilateral     catarats  . Abdominal hysterectomy    . Orif wrist fracture Right 06/16/2013    Procedure: OPEN REDUCTION INTERNAL FIXATION (ORIF) RIGHT  WRIST FRACTURE ;  Surgeon: Roseanne Kaufman, MD;  Location: [facility];  Service: Orthopedics;  Laterality: Right;  . Neck surgery    . Tee without cardioversion [name] 01/08/2016    Procedure: TRANSESOPHAGEAL ECHOCARDIOGRAM (TEE);  Surgeon: Jerline Pain, MD;  Location: [facility];  Service: Cardiovascular;  Laterality: [name];  . Cardioversion [name] 01/08/2016    Procedure: CARDIOVERSION;  Surgeon: Jerline Pain, MD;  Location[facility];  Service: Cardiovascular;  Laterality: [name];   Family History  Problem Relation Age of Onset  . Hypertension Mother   . Lung cancer Father   . Congestive Heart Failure Mother    Social History  Substance Use Topics  . Smoking status: Current Every Day Smoker -- 1.00 packs/day for 57 years  . Smokeless tobacco: None  . Alcohol Use: No   OB History    No data available     Review of Systems  Unable to perform ROS: Mental status change      Allergies  Review of patient's allergies indicates no known allergies.  Home Medications   Prior to Admission medications   Medication Sig Start Date End Date Taking? Authorizing Provider  apixaban (ELIQUIS) 5 MG TABS tablet Take 1 tablet (5 mg total) by mouth 2 (two) times daily. 12/08/15  Yes Sueanne Margarita, MD  memantine (NAMENDA) 10 MG tablet Take 1  tablet (10 mg total) by mouth 2 (two) times daily. 05/19/15  Yes Marcial Pacas, MD  solifenacin (VESICARE) 5 MG tablet Take 1 tablet (5 mg total) by mouth daily. 05/19/15  Yes Marcial Pacas, MD  alendronate (FOSAMAX) 70 MG tablet Take 70 mg by mouth once a week.  08/09/15   Historical Provider, MD  Ascorbic Acid (VITAMIN C) 1000 MG tablet Take 1,000 mg by mouth daily.    Historical Provider, MD  Cholecalciferol (D3-1000) 1000 UNITS capsule Take 1,000 Units by mouth daily.    Historical Provider, MD  Cyanocobalamin (VITAMIN B-12) 1000 MCG SUBL Place 1,000 mcg under the tongue daily.    Historical Provider, MD  FLUoxetine (PROZAC) 20 MG capsule Take 20 mg by mouth daily.  08/07/15    Historical Provider, MD  Multiple Vitamins-Minerals (ALIVE WOMENS GUMMY PO) Take 2 capsules by mouth daily.    Historical Provider, MD  pyridOXINE (VITAMIN B-6) 100 MG tablet Take 100 mg by mouth daily.    Historical Provider, MD   BP 158/102 mmHg  Pulse 80  Temp(Src) 99.7 F (37.6 C) (Rectal)  Resp 20  Ht 5\' 3"  (1.6 m)  Wt 49.896 kg  BMI 19.49 kg/m2  SpO2 97% Physical Exam  Constitutional: She appears well-developed.  Highly agitated  HENT:  Head: Normocephalic and atraumatic.  Mouth/Throat: Oropharynx is clear and moist.  Eyes: Conjunctivae and EOM are normal. Pupils are equal, round, and reactive to light. Right eye exhibits no discharge. Left eye exhibits no discharge. No scleral icterus.  Neck: Normal range of motion. Neck supple.  No posterior cervical spine tenderness palpation. No bony deformities or step-offs. Full range of motion of the neck intact.  Cardiovascular: Normal rate and regular rhythm.   Pulmonary/Chest: Effort normal and breath sounds normal. No respiratory distress.  Abdominal: Soft. She exhibits no distension. There is no tenderness.  Musculoskeletal: Normal range of motion.  Moving all extremities spontaneously. No tenderness to palpation over the extremities. No joint swelling or deformity.  Neurological: She is alert.  Oriented to person and place. She is extremely physically agitated and is attempting to crawl out of the bed and reaching into the air attempting to grab hold onto members around her. Follows commands for movement but does not follow commands for strength testing. Cranial nerves grossly intact. Grips my hands and kicks at me with 5/5 and symmetric strength. Sensation to light touch intact throughout. Does not follow coordination testing.  Skin: Skin is warm and dry.  No lacerations or abrasions noted over the head, trunk or extremities.  Psychiatric: She has a normal mood and affect. Her behavior is normal.  Nursing note and vitals  reviewed.   ED Course  Procedures (including critical care time) Labs Review Labs Reviewed  COMPREHENSIVE METABOLIC PANEL - Abnormal; Notable for the following:    Glucose, Bld 101 (*)    All other components within normal limits  CBC - Abnormal; Notable for the following:    WBC 14.8 (*)    All other components within normal limits  URINALYSIS, ROUTINE W REFLEX MICROSCOPIC (NOT AT [facility])  PROTIME-INR  BLOOD GAS, ARTERIAL  AMMONIA  I-STAT TROPOININ, ED    Imaging Review Dg Chest 1 View  01/27/2016  CLINICAL DATA:  2 falls since last night.  Altered mental status. EXAM: CHEST 1 VIEW COMPARISON:  Two-view chest x-ray 09/22/2015. FINDINGS: The heart size is normal. Atherosclerotic calcifications are present at the aortic arch. Mild interstitial coarsening is stable. No focal superimposed disease is  present. The visualized soft tissues and bony thorax are unremarkable. IMPRESSION: 1. No acute cardiopulmonary disease. 2. Emphysema. 3. Atherosclerosis of the thoracic aorta. Electronically Signed   By: San Morelle M.D.   On: 01/27/2016 16:15   Ct Head Wo Contrast  01/27/2016  CLINICAL DATA:  Altered mental status beginning last night with 2 falls. Initial encounter. EXAM: CT HEAD WITHOUT CONTRAST CT CERVICAL SPINE WITHOUT CONTRAST TECHNIQUE: Multidetector CT imaging of the head and cervical spine was performed following the standard protocol without intravenous contrast. Multiplanar CT image reconstructions of the cervical spine were also generated. COMPARISON:  Brain and cervical spine MRI 05/08/2015 FINDINGS: CT HEAD FINDINGS There is moderate cerebral atrophy which is unchanged. Lateral and third ventricular enlargement is unchanged and favored to be due to cerebral atrophy. Periventricular and subcortical cerebral white matter hypodensities are similar to the T2 hyperintensities on the prior MRI and are nonspecific but compatible with moderate chronic small vessel ischemic disease.  There is no evidence of acute cortical infarct, intracranial hemorrhage, mass, midline shift, or extra-axial fluid collection. Prior bilateral cataract extraction is noted. There is partial opacification of a single posterior right ethmoid air cell. No significant mastoid fluid. No skull fracture. Calcified atherosclerosis at the skullbase. CT CERVICAL SPINE FINDINGS Slight anterolisthesis of C7 on T1 is unchanged and likely degenerative, with advanced right-sided facet arthrosis at this level and mild-to-moderate disc space narrowing. Sequelae of C4-C7 anterior fusion are again identified, with prior corpectomy at C5. Solid osseous anterior fusion is present from C4-C7. There is also evidence of facet ankylosis bilaterally from C4-C7. Left-sided facet arthrosis is severe at C2-3 and moderate at C3-4. No acute cervical spine fracture is identified. Nuchal ligament calcification is noted at C5-6. C1-2 degenerative changes are present. Paraspinal soft tissues are unremarkable. IMPRESSION: 1. No evidence of acute intracranial abnormality. 2. Moderate cerebral atrophy and chronic small vessel ischemic disease. 3. No acute osseous abnormality identified in the cervical spine. Prior C4-C7 fusion. Electronically Signed   By: Logan Bores M.D.   On: 01/27/2016 16:35   Ct Cervical Spine Wo Contrast  01/27/2016  CLINICAL DATA:  Altered mental status beginning last night with 2 falls. Initial encounter. EXAM: CT HEAD WITHOUT CONTRAST CT CERVICAL SPINE WITHOUT CONTRAST TECHNIQUE: Multidetector CT imaging of the head and cervical spine was performed following the standard protocol without intravenous contrast. Multiplanar CT image reconstructions of the cervical spine were also generated. COMPARISON:  Brain and cervical spine MRI 05/08/2015 FINDINGS: CT HEAD FINDINGS There is moderate cerebral atrophy which is unchanged. Lateral and third ventricular enlargement is unchanged and favored to be due to cerebral atrophy.  Periventricular and subcortical cerebral white matter hypodensities are similar to the T2 hyperintensities on the prior MRI and are nonspecific but compatible with moderate chronic small vessel ischemic disease. There is no evidence of acute cortical infarct, intracranial hemorrhage, mass, midline shift, or extra-axial fluid collection. Prior bilateral cataract extraction is noted. There is partial opacification of a single posterior right ethmoid air cell. No significant mastoid fluid. No skull fracture. Calcified atherosclerosis at the skullbase. CT CERVICAL SPINE FINDINGS Slight anterolisthesis of C7 on T1 is unchanged and likely degenerative, with advanced right-sided facet arthrosis at this level and mild-to-moderate disc space narrowing. Sequelae of C4-C7 anterior fusion are again identified, with prior corpectomy at C5. Solid osseous anterior fusion is present from C4-C7. There is also evidence of facet ankylosis bilaterally from C4-C7. Left-sided facet arthrosis is severe at C2-3 and moderate at C3-4. No acute  cervical spine fracture is identified. Nuchal ligament calcification is noted at C5-6. C1-2 degenerative changes are present. Paraspinal soft tissues are unremarkable. IMPRESSION: 1. No evidence of acute intracranial abnormality. 2. Moderate cerebral atrophy and chronic small vessel ischemic disease. 3. No acute osseous abnormality identified in the cervical spine. Prior C4-C7 fusion. Electronically Signed   By: Logan Bores M.D.   On: 01/27/2016 16:35   I have personally reviewed and evaluated these images and lab results as part of my medical decision-making.   EKG Interpretation   Date/Time:  Tuesday Jan 27 2016 14:53:08 EDT Ventricular Rate:  97 PR Interval:  206 QRS Duration: 83 QT Interval:  355 QTC Calculation: 451 R Axis:   87 Text Interpretation:  Sinus rhythm Right atrial enlargement Anterior  infarct, old No significant change since last tracing Confirmed by ALLEN   MD,  ANTHONY ([id number]) on 01/27/2016 5:03:47 PM      MDM   Final diagnoses:  Delirium   82 year old female presenting with altered mental status. Worsening mental status over the past few weeks with increased urinary and fecal incontinence. 2 falls prior to arrival with acute worsening of agitation. Afebrile and hemodynamically stable. Patient is physically agitated and attempting to crawl out of the stretcher and is somewhat difficult to redirect. Grossly normal neuro exam. No focal deficits. No traumatic injuries noted to head, neck or extremities. Heart regular rate and rhythm. Abdomen soft, nontender. Leukocytosis of 15. Remaining blood work largely unremarkable. Urinalysis negative for infection. Troponin negative was nonischemic EKG. Chest x-ray negative for acute disease. CT head and neck negative. Consulted hospitalist for admission for delirium workup. Dr. Candiss Norse accepting.     Lahoma Crocker Jackson Fetters, PA-C 01/27/16 1743  Lacretia Leigh, MD 01/30/16 [phone]

## 2016-01-27 NOTE — Progress Notes (Signed)
ANTICOAGULATION CONSULT NOTE - Initial Consult  Pharmacy Consult for Apixaban Indication: atrial fibrillation  No Known Allergies  Patient Measurements: Height: 5\' 3"  (160 cm) Weight: 110 lb (49.896 kg) IBW/kg (Calculated) : 52.4  Vital Signs: Temp: 99.7 F (37.6 C) (05/23 1659) Temp Source: Rectal (05/23 1659) BP: 158/102 mmHg (05/23 1624) Pulse Rate: 80 (05/23 1624)  Labs:  Recent Labs  01/27/16 1446  HGB 13.0  HCT 39.4  PLT 345  LABPROT 15.0  INR 1.17  CREATININE 0.68    Estimated Creatinine Clearance: 44.2 mL/min (by C-G formula based on Cr of 0.68).   Medical History: Past Medical History  Diagnosis Date  . Hypercholesterolemia   . Complication of anesthesia     takes 3- 4 days to wake up  . Arthritis   . Sleep apnea   . Memory loss   . Depression   . Atrial fibrillation, persistent ([place]) 12/08/2015  . Syncope 12/08/2015  . Fatigue 12/08/2015    Medications:   (Not in a hospital admission)  Assessment: 43 YOF with a history of chronic atrial fibrillation recently cardioverted a few weeks ago on Eliquis but is non-compliant. She presents with confusion and AMS. Pharmacy consulted to resume apixaban. She takes apixaban 5 mg twice daily which was prescribed by her cardiologist. Given her weight and age, she may need to be dose reduced. H/H and Plt wnl   Goal of Therapy:  Stroke prevention  Monitor platelets by anticoagulation protocol: Yes   Plan:  Continue home apixaban dose of 5 mg twice daily F/u with cardiology tomorrow on whether she needs to be dose reduced   Albertina Parr, PharmD., BCPS Clinical Pharmacist Pager [phone]

## 2016-01-27 NOTE — ED Provider Notes (Signed)
Medical screening examination/treatment/procedure(s) were conducted as a shared visit with non-physician practitioner(s) and myself.  I personally evaluated the patient during the encounter.   EKG Interpretation   Date/Time:  Tuesday Jan 27 2016 14:53:08 EDT Ventricular Rate:  97 PR Interval:  206 QRS Duration: 83 QT Interval:  355 QTC Calculation: 451 R Axis:   87 Text Interpretation:  Sinus rhythm Right atrial enlargement Anterior  infarct, old No significant change since last tracing Confirmed by Maisy Newport   MD, Mateya Torti ([id number]) on 01/27/2016 5:03:47 PM     Patient here with 1 day of altered mental status. She did have a fall that was witnessed by relatives. There was concern for possible UTI but her urinalysis was negative here. Her head CT did not show any signs of infarction. No recent medication changes. Patient given Ativan 1 mg for agitation. We'll admit to medicine for workup of delirium  Lacretia Leigh, MD 01/27/16 1705

## 2016-01-27 NOTE — ED Notes (Signed)
Attempted report 

## 2016-01-27 NOTE — H&P (Signed)
TRH H&P   Patient Demographics:    Linda Morse, is a 82 y.o. female  MRN: [id number]   DOB - 07/25/1934  Admit Date - 01/27/2016  Outpatient Primary MD for the patient is Linda Solo, MD  Outpatient Specialists: Dr. Candee Furbish cardiologist   Patient coming from: Home  Chief Complaint  Patient presents with  . Altered Mental Status      HPI:    Linda Morse  is a 82 y.o. female, History of chronic atrial fibrillation recently cardioverted few weeks ago on Eliquis but noncompliant, dyslipidemia, hypertension, early onset dementia per family, arthritis, sleep apnea,Running back pain with limited mobility due to that, COPD not on home oxygen, obstructive sleep apnea does not use CPAP.   Patient lives with her daughter, according to the family she was doing well yesterday night until about 8 AM this morning, she was seen ambulating in her room around 8 AM and looked fine at her baseline, patient was found on the floor around 8:20 AM this morning with confusion, she had another fall soon thereafter, she was not complaining of any headache or weakness prior to that or after that, she remained mildly confused and was brought to the ER.  In [facility] ER she was found to be delirious and she received Ativan after which she became more somnolent, head CT was unremarkable, UA and chest x-ray were unremarkable, blood count showed mild leukocytosis, patient denied any headache, chest or abdominal pain. Although she is somnolent and unreliable historian but she repeatedly denies headache or any aches or pains except chronic back pain. She was moving all 4 extremities. I was called to admit the patient for  encephalopathy workup.   Review of systems:    Somnolent due to Ativan she received in the ER, unreliable historian but able to answer that she has no headache or chest pain, denies any photophobia.  With Past History of the following :    Past Medical History  Diagnosis Date  . Hypercholesterolemia   . Complication of anesthesia     takes 3- 4 days to wake up  . Arthritis   . Sleep apnea   . Memory loss   . Depression   . Atrial fibrillation, persistent ([place]) 12/08/2015  . Syncope 12/08/2015  . Fatigue 12/08/2015      Past  Surgical History  Procedure Laterality Date  . Back surgery    . Knee surgery    . Eye surgery Bilateral     catarats  . Abdominal hysterectomy    . Orif wrist fracture Right 06/16/2013    Procedure: OPEN REDUCTION INTERNAL FIXATION (ORIF) RIGHT WRIST FRACTURE ;  Surgeon: Roseanne Kaufman, MD;  Location: [place];  Service: Orthopedics;  Laterality: Right;  . Neck surgery    . Tee without cardioversion [name] 01/08/2016    Procedure: TRANSESOPHAGEAL ECHOCARDIOGRAM (TEE);  Surgeon: Jerline Pain, MD;  Location: [place];  Service: Cardiovascular;  Laterality: [name];  . Cardioversion [name] 01/08/2016    Procedure: CARDIOVERSION;  Surgeon: Jerline Pain, MD;  Location: [facility] ENDOSCOPY;  Service: Cardiovascular;  Laterality: [name];      Social History:     Social History  Substance Use Topics  . Smoking status: Current Every Day Smoker -- 1.00 packs/day for 57 years  . Smokeless tobacco: Not on file  . Alcohol Use: No     Lives - at home walks with a walker    Family History :     Family History  Problem Relation Age of Onset  . Hypertension Mother   . Lung cancer Father   . Congestive Heart Failure Mother        Home Medications:   Prior to Admission medications   Medication Sig Start Date End Date Taking? Authorizing Provider  apixaban (ELIQUIS) 5 MG TABS tablet Take 1 tablet (5 mg total) by mouth 2 (two) times daily. 12/08/15  Yes Sueanne Margarita, MD    memantine (NAMENDA) 10 MG tablet Take 1 tablet (10 mg total) by mouth 2 (two) times daily. 05/19/15  Yes Marcial Pacas, MD  solifenacin (VESICARE) 5 MG tablet Take 1 tablet (5 mg total) by mouth daily. 05/19/15  Yes Marcial Pacas, MD  alendronate (FOSAMAX) 70 MG tablet Take 70 mg by mouth once a week.  08/09/15   Historical Provider, MD  Ascorbic Acid (VITAMIN C) 1000 MG tablet Take 1,000 mg by mouth daily.    Historical Provider, MD  Cholecalciferol (D3-1000) 1000 UNITS capsule Take 1,000 Units by mouth daily.    Historical Provider, MD  Cyanocobalamin (VITAMIN B-12) 1000 MCG SUBL Place 1,000 mcg under the tongue daily.    Historical Provider, MD  FLUoxetine (PROZAC) 20 MG capsule Take 20 mg by mouth daily.  08/07/15   Historical Provider, MD  Multiple Vitamins-Minerals (ALIVE WOMENS GUMMY PO) Take 2 capsules by mouth daily.    Historical Provider, MD  pyridOXINE (VITAMIN B-6) 100 MG tablet Take 100 mg by mouth daily.    Historical Provider, MD     Allergies:    No Known Allergies   Physical Exam:   Vitals  Blood pressure 158/102, pulse 80, temperature 99.7 F (37.6 C), temperature source Rectal, resp. rate 20, height 5\' 3"  (1.6 m), weight 49.896 kg (110 lb), SpO2 97 %.   1. General frail White elderly female lying in bed in NAD,     2. Normal affect and insight, Not Suicidal or Homicidal, she is somnolent but moving all 4 extremities, answers minimal questions, follows minimal commands.  3. No F.N deficits, ALL C.Nerves Intact, Strength 5/5 all 4 extremities, Sensation intact all 4 extremities, Plantars down going.  4. Ears and Eyes appear Normal, Conjunctivae clear, PERRLA. Moist Oral Mucosa.  5. Supple Neck, No JVD, No cervical lymphadenopathy appriciated, No Carotid Bruits.  6. Symmetrical Chest wall movement, Good air movement  bilaterally, CTAB.  7. RRR, No Gallops, Rubs or Murmurs, No Parasternal Heave.  8. Positive Bowel Sounds, Abdomen Soft, No tenderness, No organomegaly  appriciated,No rebound -guarding or rigidity.  9.  No Cyanosis, Normal Skin Turgor, No Skin Rash or Bruise.  10. Good muscle tone,  joints appear normal , no effusions, Normal ROM.  11. No Palpable Lymph Nodes in Neck or Axillae      Data Review:    CBC  Recent Labs Lab 01/27/16 1446  WBC 14.8*  HGB 13.0  HCT 39.4  PLT 345  MCV 90.4  MCH 29.8  MCHC 33.0  RDW 12.8   ------------------------------------------------------------------------------------------------------------------  Chemistries   Recent Labs Lab 01/27/16 1446  NA 138  K 3.8  CL 103  CO2 26  GLUCOSE 101*  BUN 12  CREATININE 0.68  CALCIUM 9.5  AST 26  ALT 16  ALKPHOS 86  BILITOT 0.6   ------------------------------------------------------------------------------------------------------------------ estimated creatinine clearance is 44.2 mL/min (by C-G formula based on Cr of 0.68). ------------------------------------------------------------------------------------------------------------------ No results for input(s): TSH, T4TOTAL, T3FREE, THYROIDAB in the last 72 hours.  Invalid input(s): FREET3  Coagulation profile  Recent Labs Lab 01/27/16 1446  INR 1.17   ------------------------------------------------------------------------------------------------------------------- No results for input(s): DDIMER in the last 72 hours. -------------------------------------------------------------------------------------------------------------------  Cardiac Enzymes No results for input(s): CKMB, TROPONINI, MYOGLOBIN in the last 168 hours.  Invalid input(s): CK ------------------------------------------------------------------------------------------------------------------ No results found for: BNP   ---------------------------------------------------------------------------------------------------------------  Urinalysis    Component Value Date/Time   COLORURINE YELLOW 01/27/2016 [place] 01/27/2016 1425   LABSPEC 1.015 01/27/2016 1425   PHURINE 7.0 01/27/2016 1425   GLUCOSEU NEGATIVE 01/27/2016 [place] 01/27/2016 [place] 01/27/2016 1425   [place] 01/27/2016 1425   PROTEINUR NEGATIVE 01/27/2016 1425   UROBILINOGEN 0.2 05/26/2011 2210   NITRITE NEGATIVE 01/27/2016 1425   LEUKOCYTESUR NEGATIVE 01/27/2016 1425    ----------------------------------------------------------------------------------------------------------------   Imaging Results:    Dg Chest 1 View  01/27/2016  CLINICAL DATA:  2 falls since last night.  Altered mental status. EXAM: CHEST 1 VIEW COMPARISON:  Two-view chest x-ray 09/22/2015. FINDINGS: The heart size is normal. Atherosclerotic calcifications are present at the aortic arch. Mild interstitial coarsening is stable. No focal superimposed disease is present. The visualized soft tissues and bony thorax are unremarkable. IMPRESSION: 1. No acute cardiopulmonary disease. 2. Emphysema. 3. Atherosclerosis of the thoracic aorta. Electronically Signed   By: San Morelle M.D.   On: 01/27/2016 16:15   Ct Head Wo Contrast  01/27/2016  CLINICAL DATA:  Altered mental status beginning last night with 2 falls. Initial encounter. EXAM: CT HEAD WITHOUT CONTRAST CT CERVICAL SPINE WITHOUT CONTRAST TECHNIQUE: Multidetector CT imaging of the head and cervical spine was performed following the standard protocol without intravenous contrast. Multiplanar CT image reconstructions of the cervical spine were also generated. COMPARISON:  Brain and cervical spine MRI 05/08/2015 FINDINGS: CT HEAD FINDINGS There is moderate cerebral atrophy which is unchanged. Lateral and third ventricular enlargement is unchanged and favored to be due to cerebral atrophy. Periventricular and subcortical cerebral white matter hypodensities are similar to the T2 hyperintensities on the prior MRI and are nonspecific but compatible with  moderate chronic small vessel ischemic disease. There is no evidence of acute cortical infarct, intracranial hemorrhage, mass, midline shift, or extra-axial fluid collection. Prior bilateral cataract extraction is noted. There is partial opacification of a single posterior right ethmoid air cell. No significant mastoid fluid. No skull fracture. Calcified atherosclerosis  at the skullbase. CT CERVICAL SPINE FINDINGS Slight anterolisthesis of C7 on T1 is unchanged and likely degenerative, with advanced right-sided facet arthrosis at this level and mild-to-moderate disc space narrowing. Sequelae of C4-C7 anterior fusion are again identified, with prior corpectomy at C5. Solid osseous anterior fusion is present from C4-C7. There is also evidence of facet ankylosis bilaterally from C4-C7. Left-sided facet arthrosis is severe at C2-3 and moderate at C3-4. No acute cervical spine fracture is identified. Nuchal ligament calcification is noted at C5-6. C1-2 degenerative changes are present. Paraspinal soft tissues are unremarkable. IMPRESSION: 1. No evidence of acute intracranial abnormality. 2. Moderate cerebral atrophy and chronic small vessel ischemic disease. 3. No acute osseous abnormality identified in the cervical spine. Prior C4-C7 fusion. Electronically Signed   By: Logan Bores M.D.   On: 01/27/2016 16:35   Ct Cervical Spine Wo Contrast  01/27/2016  CLINICAL DATA:  Altered mental status beginning last night with 2 falls. Initial encounter. EXAM: CT HEAD WITHOUT CONTRAST CT CERVICAL SPINE WITHOUT CONTRAST TECHNIQUE: Multidetector CT imaging of the head and cervical spine was performed following the standard protocol without intravenous contrast. Multiplanar CT image reconstructions of the cervical spine were also generated. COMPARISON:  Brain and cervical spine MRI 05/08/2015 FINDINGS: CT HEAD FINDINGS There is moderate cerebral atrophy which is unchanged. Lateral and third ventricular enlargement is unchanged and  favored to be due to cerebral atrophy. Periventricular and subcortical cerebral white matter hypodensities are similar to the T2 hyperintensities on the prior MRI and are nonspecific but compatible with moderate chronic small vessel ischemic disease. There is no evidence of acute cortical infarct, intracranial hemorrhage, mass, midline shift, or extra-axial fluid collection. Prior bilateral cataract extraction is noted. There is partial opacification of a single posterior right ethmoid air cell. No significant mastoid fluid. No skull fracture. Calcified atherosclerosis at the skullbase. CT CERVICAL SPINE FINDINGS Slight anterolisthesis of C7 on T1 is unchanged and likely degenerative, with advanced right-sided facet arthrosis at this level and mild-to-moderate disc space narrowing. Sequelae of C4-C7 anterior fusion are again identified, with prior corpectomy at C5. Solid osseous anterior fusion is present from C4-C7. There is also evidence of facet ankylosis bilaterally from C4-C7. Left-sided facet arthrosis is severe at C2-3 and moderate at C3-4. No acute cervical spine fracture is identified. Nuchal ligament calcification is noted at C5-6. C1-2 degenerative changes are present. Paraspinal soft tissues are unremarkable. IMPRESSION: 1. No evidence of acute intracranial abnormality. 2. Moderate cerebral atrophy and chronic small vessel ischemic disease. 3. No acute osseous abnormality identified in the cervical spine. Prior C4-C7 fusion. Electronically Signed   By: Logan Bores M.D.   On: 01/27/2016 16:35    My personal review of EKG: Rhythm NSR,  , no Acute ST changes   Assessment & Plan:      1. Acute encephalopathy. Cause unclear however stroke remains in differential due to sudden onset and recent cardioversion with noncompliance with Eliquis. On exam she has no facial droop and she is moving all 4 extremities to pain & by herself. Head CT is unremarkable, UA and chest x-ray are unremarkable, she denies  any headache, has no neck stiffness or photophobia. We will try to obtain MRI brain, will have to premedicate her with Ativan and Haldol if needed. Have requested neurology to evaluate, bedside swallow screen, for now nothing by mouth except essential medications. We will also obtain EEG, ammonia level, ABG, B-12 and TSH along with an abdominal KUB.  2. Chronic atrial fibrillation [place]  vasc 2 score of 4 or above. Recently cardioverted follows with Dr. Marlou Porch currently in normal sinus rhythm, continue Eliquis to be dosed by pharmacy, urgently not on any rate control medications, monitor on telemetry.  3. Dementia which was recently diagnosed per family. Continue Namenda, continue vitamin B-12 and B-6 supplementation. Will check B-12 levels.  4. Mild leukocytosis. Nonspecific, UA and chest x-ray unremarkable, get KUB. Abdominal exam benign. Monitor trend.  5. Vitamin B-12 deficiency. Check level, continue home supplementation.  6. Osteoporosis. Resume Fosamax upon discharge.  7. Obstructive sleep apnea. Does not wear CPAP. Check ABG as she has encephalopathy and monitor.    DVT Prophylaxis Eliquis  AM Labs Ordered, also please review Full Orders  Family Communication: Admission, patients condition and plan of care including tests being ordered have been discussed with the patient and daughter who indicate understanding and agree with the plan and Code Status.  Code Status DNR  Likely DC to  TBD  Condition GUARDED     Consults called: Neuro  Admission status: Inpt Tele    Time spent in minutes : 35   Harman Ferrin K M.D on 01/27/2016 at 5:53 PM  Between 7am to 7pm - Pager - [phone]. After 7pm go to [email or web address] - password [facility]  [facility] - Office  [phone]

## 2016-01-27 NOTE — ED Notes (Signed)
Admitting MD at bedside.

## 2016-01-28 ENCOUNTER — Inpatient Hospital Stay (HOSPITAL_COMMUNITY): Payer: Medicare Other

## 2016-01-28 DIAGNOSIS — I481 Persistent atrial fibrillation: Secondary | ICD-10-CM

## 2016-01-28 DIAGNOSIS — G0490 Encephalitis and encephalomyelitis, unspecified: Secondary | ICD-10-CM | POA: Diagnosis present

## 2016-01-28 DIAGNOSIS — G319 Degenerative disease of nervous system, unspecified: Secondary | ICD-10-CM

## 2016-01-28 DIAGNOSIS — D72829 Elevated white blood cell count, unspecified: Secondary | ICD-10-CM | POA: Diagnosis present

## 2016-01-28 DIAGNOSIS — E538 Deficiency of other specified B group vitamins: Secondary | ICD-10-CM | POA: Diagnosis present

## 2016-01-28 DIAGNOSIS — F0390 Unspecified dementia without behavioral disturbance: Secondary | ICD-10-CM

## 2016-01-28 DIAGNOSIS — J449 Chronic obstructive pulmonary disease, unspecified: Secondary | ICD-10-CM

## 2016-01-28 LAB — BASIC METABOLIC PANEL
Anion gap: 12 (ref 5–15)
BUN: 13 mg/dL (ref 6–20)
CALCIUM: 9 mg/dL (ref 8.9–10.3)
CHLORIDE: 102 mmol/L (ref 101–111)
CO2: 21 mmol/L — ABNORMAL LOW (ref 22–32)
CREATININE: 0.74 mg/dL (ref 0.44–1.00)
Glucose, Bld: 116 mg/dL — ABNORMAL HIGH (ref 65–99)
Potassium: 3.5 mmol/L (ref 3.5–5.1)
SODIUM: 135 mmol/L (ref 135–145)

## 2016-01-28 LAB — CBC
HEMATOCRIT: 40.6 % (ref 36.0–46.0)
Hemoglobin: 13.3 g/dL (ref 12.0–15.0)
MCH: 29.6 pg (ref 26.0–34.0)
MCHC: 32.8 g/dL (ref 30.0–36.0)
MCV: 90.4 fL (ref 78.0–100.0)
Platelets: ADEQUATE 10*3/uL (ref 150–400)
RBC: 4.49 MIL/uL (ref 3.87–5.11)
RDW: 12.9 % (ref 11.5–15.5)
WBC: 11 10*3/uL — ABNORMAL HIGH (ref 4.0–10.5)

## 2016-01-28 LAB — HEPARIN LEVEL (UNFRACTIONATED)
HEPARIN UNFRACTIONATED: 0.5 [IU]/mL (ref 0.30–0.70)
Heparin Unfractionated: 0.66 IU/mL (ref 0.30–0.70)

## 2016-01-28 LAB — APTT
APTT: 27 s (ref 24–37)
APTT: 28 s (ref 24–37)
APTT: 47 s — AB (ref 24–37)

## 2016-01-28 LAB — LACTIC ACID, PLASMA
LACTIC ACID, VENOUS: 1 mmol/L (ref 0.5–2.0)
LACTIC ACID, VENOUS: 1.3 mmol/L (ref 0.5–2.0)

## 2016-01-28 LAB — PROTIME-INR
INR: 1.18 (ref 0.00–1.49)
Prothrombin Time: 15.1 seconds (ref 11.6–15.2)

## 2016-01-28 LAB — PROCALCITONIN: Procalcitonin: <0.1 ng/mL

## 2016-01-28 MED ORDER — SODIUM CHLORIDE 0.9 % IV SOLN
1.0000 g | Freq: Four times a day (QID) | INTRAVENOUS | Status: AC
  Administered 2016-01-28 – 2016-02-04 (×27): 1 g via INTRAVENOUS
  Filled 2016-01-28 (×28): qty 1000

## 2016-01-28 MED ORDER — DEXTROSE 5 % IV SOLN
10.0000 mg/kg | Freq: Two times a day (BID) | INTRAVENOUS | Status: DC
  Administered 2016-01-29 – 2016-02-01 (×7): 440 mg via INTRAVENOUS
  Filled 2016-01-28 (×10): qty 8.8

## 2016-01-28 MED ORDER — HEPARIN (PORCINE) IN NACL 100-0.45 UNIT/ML-% IJ SOLN
800.0000 [IU]/h | INTRAMUSCULAR | Status: AC
  Administered 2016-01-28: 700 [IU]/h via INTRAVENOUS
  Administered 2016-01-29: 800 [IU]/h via INTRAVENOUS
  Filled 2016-01-28 (×2): qty 250

## 2016-01-28 MED ORDER — CYANOCOBALAMIN 1000 MCG/ML IJ SOLN
1000.0000 ug | Freq: Every day | INTRAMUSCULAR | Status: DC
  Administered 2016-01-28 – 2016-01-31 (×4): 1000 ug via SUBCUTANEOUS
  Filled 2016-01-28 (×4): qty 1

## 2016-01-28 MED ORDER — DEXTROSE 5 % IV SOLN
2.0000 g | Freq: Once | INTRAVENOUS | Status: AC
  Administered 2016-01-28: 2 g via INTRAVENOUS
  Filled 2016-01-28: qty 2

## 2016-01-28 MED ORDER — VANCOMYCIN HCL IN DEXTROSE 750-5 MG/150ML-% IV SOLN
750.0000 mg | INTRAVENOUS | Status: DC
  Administered 2016-01-29 – 2016-01-31 (×3): 750 mg via INTRAVENOUS
  Filled 2016-01-28 (×4): qty 150

## 2016-01-28 MED ORDER — CEFTRIAXONE SODIUM 2 G IJ SOLR
2.0000 g | Freq: Two times a day (BID) | INTRAMUSCULAR | Status: AC
  Administered 2016-01-28 – 2016-02-03 (×13): 2 g via INTRAVENOUS
  Filled 2016-01-28 (×16): qty 2

## 2016-01-28 MED ORDER — DEXTROSE-NACL 5-0.9 % IV SOLN
INTRAVENOUS | Status: DC
  Administered 2016-01-28: 50 mL/h via INTRAVENOUS
  Administered 2016-01-29 – 2016-01-30 (×2): via INTRAVENOUS

## 2016-01-28 MED ORDER — DEXTROSE 5 % IV SOLN
10.0000 mg/kg | INTRAVENOUS | Status: AC
  Administered 2016-01-28: 440 mg via INTRAVENOUS
  Filled 2016-01-28: qty 8.8

## 2016-01-28 MED ORDER — CETYLPYRIDINIUM CHLORIDE 0.05 % MT LIQD
7.0000 mL | Freq: Two times a day (BID) | OROMUCOSAL | Status: DC
  Administered 2016-01-28 – 2016-01-31 (×8): 7 mL via OROMUCOSAL

## 2016-01-28 MED ORDER — SODIUM CHLORIDE 0.9 % IV SOLN
2.0000 g | Freq: Once | INTRAVENOUS | Status: AC
  Administered 2016-01-28: 2 g via INTRAVENOUS
  Filled 2016-01-28: qty 2000

## 2016-01-28 MED ORDER — VANCOMYCIN HCL IN DEXTROSE 1-5 GM/200ML-% IV SOLN
1000.0000 mg | Freq: Once | INTRAVENOUS | Status: AC
  Administered 2016-01-28: 1000 mg via INTRAVENOUS
  Filled 2016-01-28: qty 200

## 2016-01-28 MED ORDER — SODIUM CHLORIDE 0.9 % IV SOLN: INTRAVENOUS | Status: DC

## 2016-01-28 NOTE — Procedures (Signed)
History:  82 yo F with altered mental status  Sedation: None  Technique: This is a 21 channel routine scalp EEG performed at the bedside with bipolar and monopolar montages arranged in accordance to the international 10/20 system of electrode placement. One channel was dedicated to EKG recording.    Background: There is high voltage irregular delta and theta activity diffusely. In addition, there are frequent discharges with triphasic morphology and shifting bifrontal predominance. There is a posterior dominant rhythm of 8 Hz which is better seen on the left than right.   Photic stimulation: Physiologic driving is not performed  EEG Abnormalities: 1) Triphasic wavee 2) asymmetric PDR 3) Generalized slow activity  Clinical Interpretation: This EEG is consistent with a focal right hemisperic dysfunction in the setting of a generalized non-specific cerebral dysfunction(encephalopathy).   I suspect the reduced amplitude(the indicator of right hemispheric dysfunction) of the PDR on the right is due to the subdural hygroma seen on CT which is more reflective of recording through additional material rather than actual sign of dysfunction.   The triphasic waves and generalized slow activity are consistent with encephalopathy, likely toxic/metabolic though infectious is also possible.   There was no seizure or seizure predisposition recorded on this study. Please note that a normal EEG does not preclude the possibility of epilepsy.   Roland Rack, MD [facility] [phone]  If 7pm- 7am, please page neurology on call as listed in [place].

## 2016-01-28 NOTE — Progress Notes (Signed)
Subjective: Daughter states her mother is still very much not herself. Patient is in bed and reaching out as if she sees something but is nonverbal.   Exam: Filed Vitals:   01/28/16 0506 01/28/16 0642  BP: 151/103   Pulse: 95   Temp: 101.5 F (38.6 C) 99.8 F (37.7 C)  Resp: 18        Gen: In bed, NAD MS: awake, reaching out wit right hand as is she sees something, does not follow commands.  CN: PERRLA, EOMI, face symmetric. Blinks to threat Neck is stiff (but daughter states this is normal due to cervical fusion) Motor: MAEW with less on the left arm --daughter has noted she has pain with movement.  Sensory: intact throughout   Pertinent Labs/Diagnostics: MRI shows no acute stroke or bleed with a left subdural hygroma. EEG pending  Etta Quill PA-C [facility] [phone]  Impression: 82 year old lady with a history of dementia as well as upper lip anemia and atrial fibrillation on apixaban presenting with altered mental status with acute encephalopathic state.   Given her neck stiffness and AMS, I suspect that this does represent meningitis. She had eliquis and I think that treating empirically for at least 48 hours since last eliquis dose would be indicated.   Recommendations: 1) LP 48 hours after last eliquis dose.  2) empiric broad spectrum CNS coverage(currently on ampicillin, rocephin, vancomycin) 3) will add acyclovir(though viral less likely) 4) will continue to follow.   Roland Rack, MD [facility] [phone]  If 7pm- 7am, please page neurology on call as listed in [place].  01/28/2016, 10:39 AM

## 2016-01-28 NOTE — Evaluation (Signed)
SLP Cancellation Note  Patient Details Name: DAIJANAE ANGELOPOULOS MRN: [id number] DOB: 12/12/1933   Cancelled treatment:       Reason Eval/Treat Not Completed: Other (comment);Fatigue/lethargy limiting ability to participate (asked RN to call [phone]. if pt becomes alert )   Luanna Salk, [facility] SLP [phone]

## 2016-01-28 NOTE — Progress Notes (Signed)
Pharmacy Antibiotic Note  Linda Morse is a 82 y.o. female admitted on 01/27/2016 with meningitis.  Pharmacy has been consulted for ampicillin, ceftriaxone and vancomycin dosing.  Pt received vancomycin 1g, ceftriaxone 2g and ampicillin 2g IV once.  Plan: Vancomycin 750mg  IV every 24 hours.  Goal trough 15-20 mcg/mL.  Ampicillin 2g IV q6h Ceftriaxone 2g IV q12h Monitor culture data, renal function and clinical course VT at SS prn  Height: 5\' 3"  (160 cm) Weight: 96 lb 12.5 oz (43.9 kg) IBW/kg (Calculated) : 52.4  Temp (24hrs), Avg:99.6 F (37.6 C), Min:97.9 F (36.6 C), Max:101.5 F (38.6 C)   Recent Labs Lab 01/27/16 1446  WBC 14.8*  CREATININE 0.68    Estimated Creatinine Clearance: 38.9 mL/min (by C-G formula based on Cr of 0.68).    No Known Allergies  Antimicrobials this admission: Amp 5/24 >>  CTX 5/24 >>  Vanc 5/24 >>  Dose adjustments this admission: n/a  Microbiology results:  BCx:   UCx:    Sputum:    MRSA PCR:    Andrey Cota. Diona Foley, PharmD, [place] Clinical Pharmacist Pager [phone] 01/28/2016 6:54 AM

## 2016-01-28 NOTE — Progress Notes (Signed)
EEG completed, results pending. 

## 2016-01-28 NOTE — Care Management Note (Signed)
Case Management Note  Patient Details  Name: Linda Morse MRN: [id number] Date of Birth: Oct 23, 1933  Subjective/Objective:                 Patient admitted with acute confusion. Remains with mittens today. R/O meningitis.    Action/Plan:  CM will continue to follow for DC planning.   Expected Discharge Date:  01/29/16               Expected Discharge Plan:     In-House Referral:     Discharge planning Services  CM Consult  Post Acute Care Choice:    Choice offered to:     DME Arranged:    DME Agency:     HH Arranged:    HH Agency:     Status of Service:  In process, will continue to follow  Medicare Important Message Given:    Date Medicare IM Given:    Medicare IM give by:    Date Additional Medicare IM Given:    Additional Medicare Important Message give by:     If discussed at [place] of Stay Meetings, dates discussed:    Additional Comments:  Carles Collet, RN 01/28/2016, 2:39 PM

## 2016-01-28 NOTE — Progress Notes (Addendum)
ANTICOAGULATION CONSULT NOTE - [place] for Apixaban >> Heparin Indication: atrial fibrillation  No Known Allergies  Patient Measurements: Height: 5\' 3"  (160 cm) Weight: 96 lb 12.5 oz (43.9 kg) IBW/kg (Calculated) : 52.4  Heparin Dosing Weight = 49.9 kg  Vital Signs: Temp: 98.6 F (37 C) (05/24 1224) Temp Source: Oral (05/24 1224) BP: 188/85 mmHg (05/24 1224) Pulse Rate: 96 (05/24 1224)  Labs:  Recent Labs  01/27/16 1446 01/27/16 2130 01/28/16 0551 01/28/16 0713  HGB 13.0  --  13.3  --   HCT 39.4  --  40.6  --   PLT 345  --  PLATELET CLUMPS NOTED ON SMEAR, COUNT APPEARS ADEQUATE  --   APTT  --   --   --  27  LABPROT 15.0 14.3  --  15.1  INR 1.17 1.09  --  1.18  CREATININE 0.68  --  0.74  --     Estimated Creatinine Clearance: 38.9 mL/min (by C-G formula based on Cr of 0.74).   Medical History: Past Medical History  Diagnosis Date  . Hypercholesterolemia   . Complication of anesthesia     takes 3- 4 days to wake up  . Arthritis   . Sleep apnea   . Memory loss   . Depression   . Atrial fibrillation, persistent ([place]) 12/08/2015  . Syncope 12/08/2015  . Fatigue 12/08/2015    Medications:  Prescriptions prior to admission  Medication Sig Dispense Refill Last Dose  . apixaban (ELIQUIS) 5 MG TABS tablet Take 1 tablet (5 mg total) by mouth 2 (two) times daily. 60 tablet 11 01/26/2016 at Unknown time  . memantine (NAMENDA) 10 MG tablet Take 1 tablet (10 mg total) by mouth 2 (two) times daily. 60 tablet 11 01/26/2016 at Unknown time  . solifenacin (VESICARE) 5 MG tablet Take 1 tablet (5 mg total) by mouth daily. 30 tablet 6 01/26/2016 at Unknown time  . alendronate (FOSAMAX) 70 MG tablet Take 70 mg by mouth once a week.    Unknown at Unknown time  . Ascorbic Acid (VITAMIN C) 1000 MG tablet Take 1,000 mg by mouth daily.   Unknown at Unknown time  . Cholecalciferol (D3-1000) 1000 UNITS capsule Take 1,000 Units by mouth daily.   Unknown at Unknown  time  . Cyanocobalamin (VITAMIN B-12) 1000 MCG SUBL Place 1,000 mcg under the tongue daily.   Unknown at Unknown time  . FLUoxetine (PROZAC) 20 MG capsule Take 20 mg by mouth daily.    Unknown at Unknown time  . Multiple Vitamins-Minerals (ALIVE WOMENS GUMMY PO) Take 2 capsules by mouth daily.   Unknown at Unknown time  . pyridOXINE (VITAMIN B-6) 100 MG tablet Take 100 mg by mouth daily.   Unknown at Unknown time    Assessment: 52 YOF with a history of chronic atrial fibrillation recently cardioverted a few weeks ago on Eliquis but is non-compliant reportedly missing 1-2 doses per month. She presents with confusion and AMS and is being evaluated for r/o meningitis.  Pharmacy consulted to resume apixaban however no doses given.  Now to transition to heparin in the event procedures are needed.  She takes apixaban 5 mg twice daily which was prescribed by her cardiologist. Based on her weight and age, she qualifies for dose adjustment to 2.5mg  PO BID when/if resumed.  Last dose 5/22.    Apixaban can falsely elevate the anti-Xa level (heparin level) assay and until levels correlate with aptt, appt will need to be used  to guide therapy dose adjustments.  Goal of Therapy:  Stroke prevention  Heparin level 0.3/0.7 aPTT 66-102 Monitor platelets by anticoagulation protocol: Yes   Plan:  Baseline heparin level and appt now. Heparin infusion at 700 units/hr. Heparin level, aptt in 8 hours.  Recommend decrease apixaban to 2.5 mg twice daily when/if restarted.  [employer], Pharm.D., BCPS Clinical Pharmacist Pager [phone] 01/28/2016 1:31 PM

## 2016-01-28 NOTE — Progress Notes (Signed)
ANTICOAGULATION CONSULT NOTE - [place] for Apixaban >> Heparin Indication: atrial fibrillation  No Known Allergies  Patient Measurements: Height: 5\' 3"  (160 cm) Weight: 96 lb 12.5 oz (43.9 kg) IBW/kg (Calculated) : 52.4  Heparin Dosing Weight = 49.9 kg  Vital Signs: Temp: 98.9 F (37.2 C) (05/24 2226) Temp Source: Oral (05/24 1224) BP: 174/70 mmHg (05/24 2226) Pulse Rate: 84 (05/24 2226)  Labs:  Recent Labs  01/27/16 1446 01/27/16 2130 01/28/16 0551 01/28/16 0713 01/28/16 1400 01/28/16 2249  HGB 13.0  --  13.3  --   --   --   HCT 39.4  --  40.6  --   --   --   PLT 345  --  PLATELET CLUMPS NOTED ON SMEAR, COUNT APPEARS ADEQUATE  --   --   --   APTT  --   --   --  27 28 47*  LABPROT 15.0 14.3  --  15.1  --   --   INR 1.17 1.09  --  1.18  --   --   HEPARINUNFRC  --   --   --   --  0.50 0.66  CREATININE 0.68  --  0.74  --   --   --     Estimated Creatinine Clearance: 38.9 mL/min (by C-G formula based on Cr of 0.74).   Medical History: Past Medical History  Diagnosis Date  . Hypercholesterolemia   . Complication of anesthesia     takes 3- 4 days to wake up  . Arthritis   . Sleep apnea   . Memory loss   . Depression   . Atrial fibrillation, persistent ([place]) 12/08/2015  . Syncope 12/08/2015  . Fatigue 12/08/2015    Medications:  Prescriptions prior to admission  Medication Sig Dispense Refill Last Dose  . apixaban (ELIQUIS) 5 MG TABS tablet Take 1 tablet (5 mg total) by mouth 2 (two) times daily. 60 tablet 11 01/26/2016 at Unknown time  . memantine (NAMENDA) 10 MG tablet Take 1 tablet (10 mg total) by mouth 2 (two) times daily. 60 tablet 11 01/26/2016 at Unknown time  . solifenacin (VESICARE) 5 MG tablet Take 1 tablet (5 mg total) by mouth daily. 30 tablet 6 01/26/2016 at Unknown time  . alendronate (FOSAMAX) 70 MG tablet Take 70 mg by mouth once a week.    Unknown at Unknown time  . Ascorbic Acid (VITAMIN C) 1000 MG tablet Take 1,000 mg by  mouth daily.   Unknown at Unknown time  . Cholecalciferol (D3-1000) 1000 UNITS capsule Take 1,000 Units by mouth daily.   Unknown at Unknown time  . Cyanocobalamin (VITAMIN B-12) 1000 MCG SUBL Place 1,000 mcg under the tongue daily.   Unknown at Unknown time  . FLUoxetine (PROZAC) 20 MG capsule Take 20 mg by mouth daily.    Unknown at Unknown time  . Multiple Vitamins-Minerals (ALIVE WOMENS GUMMY PO) Take 2 capsules by mouth daily.   Unknown at Unknown time  . pyridOXINE (VITAMIN B-6) 100 MG tablet Take 100 mg by mouth daily.   Unknown at Unknown time    Assessment: 19 YOF with a history of chronic atrial fibrillation recently cardioverted a few weeks ago on Eliquis but is non-compliant reportedly missing 1-2 doses per month. She presents with confusion and AMS and is being evaluated for r/o meningitis.  Pharmacy consulted to resume apixaban however no doses given.  Now to transition to heparin in the event procedures are needed.  She  takes apixaban 5 mg twice daily which was prescribed by her cardiologist. Based on her weight and age, she qualifies for dose adjustment to 2.5mg  PO BID when/if resumed.  Last dose 5/22.    Apixaban can falsely elevate the anti-Xa level (heparin level) assay and until levels correlate with aptt, appt will need to be used to guide therapy dose adjustments.  Initial aPTT is subtherapeutic at 47 seconds on heparin 700 units/hr. Nurse reports no issues with infusion and just some old blood around IV site.  Goal of Therapy:  Heparin level 0.3-0.7 aPTT 66-102 Monitor platelets by anticoagulation protocol: Yes   Plan:  Increase heparin to 800 units/hr. Daily aPTT/HL/CBC Monitor s/sx of bleeding  Andrey Cota. Diona Foley, PharmD, [place] Clinical Pharmacist Pager [phone] 01/28/2016 11:11 PM

## 2016-01-28 NOTE — Progress Notes (Signed)
PT Cancellation Note  Patient Details Name: Linda Morse MRN: [id number] DOB: 02-06-1934   Cancelled Treatment:    Reason Eval/Treat Not Completed: Patient at procedure or test/unavailable;Patient's level of consciousness. Pt at EEG earlier and now sleepy and not following any commands. Will reattempt tomorrow.   [name] 01/28/2016, 12:08 PM [employer] PT [phone]

## 2016-01-28 NOTE — Consult Note (Signed)
CC:  Chief Complaint  Patient presents with  . Altered Mental Status    HPI: Linda Morse is a 82 y.o. female with altered mental status after a fall yesterday morning.  Workup so far has been unrevealing.  She has enlarged ventricles on her brain imaging.  I've been asked to comment on that.  PMH: Past Medical History  Diagnosis Date  . Hypercholesterolemia   . Complication of anesthesia     takes 3- 4 days to wake up  . Arthritis   . Sleep apnea   . Memory loss   . Depression   . Atrial fibrillation, persistent ([place]) 12/08/2015  . Syncope 12/08/2015  . Fatigue 12/08/2015    PSH: Past Surgical History  Procedure Laterality Date  . Back surgery    . Knee surgery    . Eye surgery Bilateral     catarats  . Abdominal hysterectomy    . Orif wrist fracture Right 06/16/2013    Procedure: OPEN REDUCTION INTERNAL FIXATION (ORIF) RIGHT WRIST FRACTURE ;  Surgeon: Roseanne Kaufman, MD;  Location: [facility];  Service: Orthopedics;  Laterality: Right;  . Neck surgery    . Tee without cardioversion [name] 01/08/2016    Procedure: TRANSESOPHAGEAL ECHOCARDIOGRAM (TEE);  Surgeon: Jerline Pain, MD;  Location: [facility];  Service: Cardiovascular;  Laterality: [name];  . Cardioversion [name] 01/08/2016    Procedure: CARDIOVERSION;  Surgeon: Jerline Pain, MD;  [facility] END[facility]COPY;  Service: Cardiovascular;  Laterality: [name];    SH: Social History  Substance Use Topics  . Smoking status: Current Every Day Smoker -- 1.00 packs/day for 57 years  . Smokeless tobacco: None  . Alcohol Use: No    MEDS: Prior to Admission medications   Medication Sig Start Date End Date Taking? Authorizing Provider  apixaban (ELIQUIS) 5 MG TABS tablet Take 1 tablet (5 mg total) by mouth 2 (two) times daily. 12/08/15  Yes Sueanne Margarita, MD  memantine (NAMENDA) 10 MG tablet Take 1 tablet (10 mg total) by mouth 2 (two) times daily. 05/19/15  Yes Marcial Pacas, MD  solifenacin (VESICARE) 5 MG tablet Take 1 tablet (5 mg total) by  mouth daily. 05/19/15  Yes Marcial Pacas, MD  alendronate (FOSAMAX) 70 MG tablet Take 70 mg by mouth once a week.  08/09/15   Historical Provider, MD  Ascorbic Acid (VITAMIN C) 1000 MG tablet Take 1,000 mg by mouth daily.    Historical Provider, MD  Cholecalciferol (D3-1000) 1000 UNITS capsule Take 1,000 Units by mouth daily.    Historical Provider, MD  Cyanocobalamin (VITAMIN B-12) 1000 MCG SUBL Place 1,000 mcg under the tongue daily.    Historical Provider, MD  FLUoxetine (PROZAC) 20 MG capsule Take 20 mg by mouth daily.  08/07/15   Historical Provider, MD  Multiple Vitamins-Minerals (ALIVE WOMENS GUMMY PO) Take 2 capsules by mouth daily.    Historical Provider, MD  pyridOXINE (VITAMIN B-6) 100 MG tablet Take 100 mg by mouth daily.    Historical Provider, MD    ALLERGY: No Known Allergies  ROS: Review of Systems  Unable to perform ROS: dementia    Unable to obtain due to altered mental status  NEUROLOGIC EXAM: Arousable Confused Speech sparse CN grossly intact Moves all extremities to command Sensation grossly intact  IMAGING: I have reviewed her recent brain MRI and CT and compared these to a study from September, 2016.  She has enlarged ventricles in proportion to her cerebral atrophy.  This is unchanged since September  of 2016.  No evidence of hydrocephalus.  IMPRESSION: - 82 y.o. female with altered mental status and cerebral atrophy.  No evidence of hydrocephalus.  PLAN: - No surgical intervention indicated

## 2016-01-28 NOTE — Progress Notes (Signed)
Pharmacy Antibiotic Note  Linda Morse is a 82 y.o. female admitted on 01/27/2016 with meningitis.  Pharmacy has been consulted for Acyclovir dosing this afternoon for viral coverage.   Vancomycin, Ampicillin and Ceftiaxone begun earlier today.   Tmax 101.5, WBC 11.0, LA 1.0>1.3, PTC < 0.10  Plan:  Acyclovir 10 mg/kg (440 mg) given stat at 4pm.  Will continue with 10 mg/kr (440 mg) q12hrs.  Follow renal function, culture data, progress.  Height: '5\' 3"'$  (160 cm) Weight: 96 lb 12.5 oz (43.9 kg) IBW/kg (Calculated) : 52.4  Temp (24hrs), Avg:99.8 F (37.7 C), Min:98.6 F (37 C), Max:101.5 F (38.6 C)   Recent Labs Lab 01/27/16 1446 01/28/16 0551 01/28/16 0713 01/28/16 1002  WBC 14.8* 11.0*  --   --   CREATININE 0.68 0.74  --   --   LATICACIDVEN  --   --  1.0 1.3    Estimated Creatinine Clearance: 38.9 mL/min (by C-G formula based on Cr of 0.74).    No Known Allergies  Antimicrobials this admission:  Vancomycin 5/24>>  Ampicillin 5/24>>  Ceftriaxone 5/24>>  Acyclovir 5/24>>  Dose adjustments this admission:  none  Microbiology results:  5/24 blood x 2 -  5/24 urine -  Arty Baumgartner, [place] Pager: [phone] 01/28/2016 7:12 PM

## 2016-01-28 NOTE — Progress Notes (Addendum)
[facility] Progress Note  Patient: Linda Morse [id number]   PCP: Tawanna Solo, MD DOB: 01/12/1934   DOA: 01/27/2016   DOS: 01/28/2016   Date of Service: the patient was seen and examined on 01/28/2016  Subjective: The patient is unable to communicate any complaint. No acute events identified overnight. No nausea no vomiting. No seizures overnight Nutrition: At present nothing by mouth pending swallowing evaluation  Brief hospital course: Patient with past medical history of chronic A. fib recently cardioverted in 01/08/2016, on adequate is noncompliant, dyslipidemia, dementia per family, sleep apnea, COPD, not on C Pap, not on home oxygen, was admitted on 01/27/2016, with complaint of confusion with fall. Patient has history of gait ataxia as well as dementia at her baseline and follows up with neurology as an outpatient. Patient was found to have acute encephalopathy of unknown etiology. Currently further plan is continue further workup for the encephalopathy.  Assessment and Plan: 1. Acute encephalopathy Etiology still unclear, likely multifactorial MRI brain negative for stroke shows evidence of hydrocephalus although as per neurology no evidence of hydrocephalus and this appears to be ventriculomegaly secondary to cerebral atrophy. EEG shows evidence of triphasic wave as well as focal right hemispheric dysfunction likely secondary to subdural hygroma. Awaiting recommendations from neurology. Appreciate neurosurgery input. Patient did spike a fever this morning and with leukocytosis patient is given an empiric antibiotics for CNS infection. UA normal, lactic acid normal, TSH normal, ammonia normal Lumbar puncture per neurology. Continue gentle IV hydration. Nothing by mouth pending speech evaluation. PTOT consult pending. When necessary Haldol. Avoid psychotropic medications at present.  2. History of persistent A. fib. Recently cardioverted on 01/08/2016. On  adequate is noncompliant. MRI negative for acute stroke. Given the recent cardioversion patient will require 6 weeks of anticoagulation from the cardioversion and since patient only has a hygroma without any ongoing active bleeding, anticoagulation was deemed not contraindicated and therefore I would start the patient on heparin while the patient remains nothing by mouth.  3. B-12 deficiency. Changing oral B-12 to subcutaneous in the hospital.  4. History of COPD, history of sleep apnea. ABG does not show any evidence of hypercarbia. No evidence of exacerbation at present. Monitor on telemetry.  Pain management: When necessary Tylenol Activity: Consulted physical therapy Bowel regimen: last BM 01/27/2016 Diet: Nothing by mouth pending swallowing DVT Prophylaxis: on therapeutic anticoagulation.  Advance goals of care discussion: DNR DNI  Family Communication: no family was present at bedside, at the time of interview.    Disposition:  Discharge to a SNF, pending PTOT consult and improvement in mentation Expected discharge date: 01/31/2016  Consultants: neurology  Procedures: EEG  Antibiotics: Anti-infectives    Start     Dose/Rate Route Frequency Ordered Stop   01/29/16 0700  vancomycin (VANCOCIN) IVPB 750 mg/150 ml premix     750 mg 150 mL/hr over 60 Minutes Intravenous Every 24 hours 01/28/16 0659     01/28/16 1900  cefTRIAXone (ROCEPHIN) 2 g in dextrose 5 % 50 mL IVPB     2 g 100 mL/hr over 30 Minutes Intravenous Every 12 hours 01/28/16 0659     01/28/16 1300  ampicillin (OMNIPEN) 1 g in sodium chloride 0.9 % 50 mL IVPB     1 g 150 mL/hr over 20 Minutes Intravenous Every 6 hours 01/28/16 0659     01/28/16 0700  cefTRIAXone (ROCEPHIN) 2 g in dextrose 5 % 50 mL IVPB     2 g 100 mL/hr over 30 Minutes Intravenous  Once 01/28/16 0629 01/28/16 0806   01/28/16 0700  vancomycin (VANCOCIN) IVPB 1000 mg/200 mL premix     1,000 mg 200 mL/hr over 60 Minutes Intravenous  Once  01/28/16 0629 01/28/16 1025   01/28/16 0700  ampicillin (OMNIPEN) 2 g in sodium chloride 0.9 % 50 mL IVPB     2 g 150 mL/hr over 20 Minutes Intravenous  Once 01/28/16 0629 01/28/16 0730        Intake/Output Summary (Last 24 hours) at 01/28/16 1329 Last data filed at 01/28/16 0900  Gross per 24 hour  Intake  457.5 ml  Output     50 ml  Net  407.5 ml   Filed Weights   01/27/16 1417 01/27/16 2015  Weight: 49.896 kg (110 lb) 43.9 kg (96 lb 12.5 oz)    Objective: Physical Exam: Filed Vitals:   01/27/16 2015 01/28/16 0506 01/28/16 0642 01/28/16 1224  BP: 134/69 151/103  188/85  Pulse: 65 95  96  Temp: 99.3 F (37.4 C) 101.5 F (38.6 C) 99.8 F (37.7 C) 98.6 F (37 C)  TempSrc: Oral Axillary Axillary Oral  Resp: 18 18  24   Height:      Weight: 43.9 kg (96 lb 12.5 oz)     SpO2: 94% 94%  97%    General: Awake, not following command, has purposeful movement Eyes: PERRL, Conjunctiva normal ENT: Oral Mucosa clear dry. Neck: no JVD, no Abnormal Mass Or lumps Cardiovascular: S1 and S2 Present, aortic systolic Murmur, Peripheral Pulses Present Respiratory: Bilateral Air entry equal and Decreased, Clear to Auscultation, no Crackles, no wheezes Abdomen: Bowel Sound present, Soft and no tenderness Skin: no redness, no Rash  Extremities: no Pedal edema, no calf tenderness Neurologic: Mental status awake, nonverbal, not following command,  Cranial Nerves PERRL, EOM normal and present, Motor strength difficult to assess but has purposeful movement bilaterally Sensation present to painful stimuli Reflexes difficult to assess knee and biceps, babinski equivocal,  Cerebellar test unable to perform  Data Reviewed: CBC:  Recent Labs Lab 01/27/16 1446 01/28/16 0551  WBC 14.8* 11.0*  HGB 13.0 13.3  HCT 39.4 40.6  MCV 90.4 90.4  PLT 345 PLATELET CLUMPS NOTED ON SMEAR, COUNT APPEARS ADEQUATE   Basic Metabolic Panel:  Recent Labs Lab 01/27/16 1446 01/28/16 0551  NA 138 135    K 3.8 3.5  CL 103 102  CO2 26 21*  GLUCOSE 101* 116*  BUN 12 13  CREATININE 0.68 0.74  CALCIUM 9.5 9.0    Liver Function Tests:  Recent Labs Lab 01/27/16 1446  AST 26  ALT 16  ALKPHOS 86  BILITOT 0.6  PROT 6.6  ALBUMIN 4.1   No results for input(s): LIPASE, AMYLASE in the last 168 hours.  Recent Labs Lab 01/27/16 2117  AMMONIA 18   Coagulation Profile:  Recent Labs Lab 01/27/16 1446 01/27/16 2130 01/28/16 0713  INR 1.17 1.09 1.18   Cardiac Enzymes: No results for input(s): CKTOTAL, CKMB, CKMBINDEX, TROPONINI in the last 168 hours. BNP (last 3 results) No results for input(s): PROBNP in the last 8760 hours.  CBG: No results for input(s): GLUCAP in the last 168 hours.  Studies: Dg Chest 1 View  01/27/2016  CLINICAL DATA:  2 falls since last night.  Altered mental status. EXAM: CHEST 1 VIEW COMPARISON:  Two-view chest x-ray 09/22/2015. FINDINGS: The heart size is normal. Atherosclerotic calcifications are present at the aortic arch. Mild interstitial coarsening is stable. No focal superimposed disease is present. The visualized soft tissues  and bony thorax are unremarkable. IMPRESSION: 1. No acute cardiopulmonary disease. 2. Emphysema. 3. Atherosclerosis of the thoracic aorta. Electronically Signed   By: San Morelle M.D.   On: 01/27/2016 16:15   Ct Head Wo Contrast  01/27/2016  CLINICAL DATA:  Altered mental status beginning last night with 2 falls. Initial encounter. EXAM: CT HEAD WITHOUT CONTRAST CT CERVICAL SPINE WITHOUT CONTRAST TECHNIQUE: Multidetector CT imaging of the head and cervical spine was performed following the standard protocol without intravenous contrast. Multiplanar CT image reconstructions of the cervical spine were also generated. COMPARISON:  Brain and cervical spine MRI 05/08/2015 FINDINGS: CT HEAD FINDINGS There is moderate cerebral atrophy which is unchanged. Lateral and third ventricular enlargement is unchanged and favored to be due  to cerebral atrophy. Periventricular and subcortical cerebral white matter hypodensities are similar to the T2 hyperintensities on the prior MRI and are nonspecific but compatible with moderate chronic small vessel ischemic disease. There is no evidence of acute cortical infarct, intracranial hemorrhage, mass, midline shift, or extra-axial fluid collection. Prior bilateral cataract extraction is noted. There is partial opacification of a single posterior right ethmoid air cell. No significant mastoid fluid. No skull fracture. Calcified atherosclerosis at the skullbase. CT CERVICAL SPINE FINDINGS Slight anterolisthesis of C7 on T1 is unchanged and likely degenerative, with advanced right-sided facet arthrosis at this level and mild-to-moderate disc space narrowing. Sequelae of C4-C7 anterior fusion are again identified, with prior corpectomy at C5. Solid osseous anterior fusion is present from C4-C7. There is also evidence of facet ankylosis bilaterally from C4-C7. Left-sided facet arthrosis is severe at C2-3 and moderate at C3-4. No acute cervical spine fracture is identified. Nuchal ligament calcification is noted at C5-6. C1-2 degenerative changes are present. Paraspinal soft tissues are unremarkable. IMPRESSION: 1. No evidence of acute intracranial abnormality. 2. Moderate cerebral atrophy and chronic small vessel ischemic disease. 3. No acute osseous abnormality identified in the cervical spine. Prior C4-C7 fusion. Electronically Signed   By: Logan Bores M.D.   On: 01/27/2016 16:35   Ct Cervical Spine Wo Contrast  01/27/2016  CLINICAL DATA:  Altered mental status beginning last night with 2 falls. Initial encounter. EXAM: CT HEAD WITHOUT CONTRAST CT CERVICAL SPINE WITHOUT CONTRAST TECHNIQUE: Multidetector CT imaging of the head and cervical spine was performed following the standard protocol without intravenous contrast. Multiplanar CT image reconstructions of the cervical spine were also generated.  COMPARISON:  Brain and cervical spine MRI 05/08/2015 FINDINGS: CT HEAD FINDINGS There is moderate cerebral atrophy which is unchanged. Lateral and third ventricular enlargement is unchanged and favored to be due to cerebral atrophy. Periventricular and subcortical cerebral white matter hypodensities are similar to the T2 hyperintensities on the prior MRI and are nonspecific but compatible with moderate chronic small vessel ischemic disease. There is no evidence of acute cortical infarct, intracranial hemorrhage, mass, midline shift, or extra-axial fluid collection. Prior bilateral cataract extraction is noted. There is partial opacification of a single posterior right ethmoid air cell. No significant mastoid fluid. No skull fracture. Calcified atherosclerosis at the skullbase. CT CERVICAL SPINE FINDINGS Slight anterolisthesis of C7 on T1 is unchanged and likely degenerative, with advanced right-sided facet arthrosis at this level and mild-to-moderate disc space narrowing. Sequelae of C4-C7 anterior fusion are again identified, with prior corpectomy at C5. Solid osseous anterior fusion is present from C4-C7. There is also evidence of facet ankylosis bilaterally from C4-C7. Left-sided facet arthrosis is severe at C2-3 and moderate at C3-4. No acute cervical spine fracture is identified.  Nuchal ligament calcification is noted at C5-6. C1-2 degenerative changes are present. Paraspinal soft tissues are unremarkable. IMPRESSION: 1. No evidence of acute intracranial abnormality. 2. Moderate cerebral atrophy and chronic small vessel ischemic disease. 3. No acute osseous abnormality identified in the cervical spine. Prior C4-C7 fusion. Electronically Signed   By: Logan Bores M.D.   On: 01/27/2016 16:35   Mr Brain Wo Contrast  01/27/2016  CLINICAL DATA:  Acute encephalopathy. EXAM: MRI HEAD WITHOUT CONTRAST TECHNIQUE: Multiplanar, multiecho pulse sequences of the brain and surrounding structures were obtained without  intravenous contrast. COMPARISON:  CT head 01/27/2016 FINDINGS: Advanced atrophy with diffuse cortical volume loss. Moderate ventricular enlargement involving the third and lateral ventricles. Lateral ventricles have a rounded appearance which could be due to hydrocephalus. On sagittal imaging, the proximal aqueduct is dilated. The fourth ventricle does not appear to be dilated. Possible obstructive hydrocephalus. In addition, there is periventricular white matter hyperintensity which could be transependymal absorption of CSF versus chronic ischemia. Subdural hygroma lateral to the right hemispheric medial to the left hemisphere. Negative for intracranial hemorrhage. Negative for acute infarct. Small chronic infarct left thalamus.  Brainstem normal in signal. IMPRESSION: Advanced atrophy Enlargement of the third and lateral ventricles with a configuration that could represent obstructive hydrocephalus. Correlate with symptoms Negative for acute infarct. Electronically Signed   By: Franchot Gallo M.D.   On: 01/27/2016 19:13   Dg Abd Portable 1v  01/27/2016  CLINICAL DATA:  Acute onset of altered mental status and multiple falls. Question of abdominal pain. Initial encounter. EXAM: PORTABLE ABDOMEN - 1 VIEW COMPARISON:  Lumbar spine radiographs performed 04/06/2010 FINDINGS: The visualized bowel gas pattern is unremarkable. Scattered air and stool filled loops of colon are seen; no abnormal dilatation of small bowel loops is seen to suggest small bowel obstruction. No free intra-abdominal air is identified, though evaluation for free air is limited on a single supine view. An The patient is status post lumbar spinal fusion at L4-L5; the sacroiliac joints are unremarkable in appearance. The visualized lung bases are essentially clear. IMPRESSION: Unremarkable bowel gas pattern; no free intra-abdominal air seen. Small amount of stool noted in the colon. Electronically Signed   By: Garald Balding M.D.   On: 01/27/2016  18:20     Scheduled Meds: . ampicillin (OMNIPEN) IV  1 g Intravenous Q6H  . antiseptic oral rinse  7 mL Mouth Rinse q12n4p  . cefTRIAXone (ROCEPHIN)  IV  2 g Intravenous Q12H  . cyanocobalamin  1,000 mcg Subcutaneous Daily  . darifenacin  7.5 mg Oral Daily  . [START ON 01/29/2016] vancomycin  750 mg Intravenous Q24H   Continuous Infusions: . dextrose 5 % and 0.9% NaCl 50 mL/hr (01/28/16 0051)  . heparin     PRN Meds: acetaminophen **OR** acetaminophen, haloperidol lactate, hydrALAZINE, ondansetron **OR** ondansetron (ZOFRAN) IV  Time spent: 30 minutes  Author: Berle Mull, MD [facility] Pager: [phone] 01/28/2016 1:29 PM  If 7PM-7AM, please contact night-coverage at [email or web address], password [facility]

## 2016-01-28 NOTE — Progress Notes (Signed)
Update on patient condition given to patient's daughter Janith Lima at 2300. Information given regarding overall patient condition-patient resting and about xray of elbow scheduled for the morning.

## 2016-01-29 DIAGNOSIS — G0490 Encephalitis and encephalomyelitis, unspecified: Secondary | ICD-10-CM

## 2016-01-29 LAB — CBC WITH DIFFERENTIAL/PLATELET
BASOS PCT: 0 %
Basophils Absolute: 0 10*3/uL (ref 0.0–0.1)
EOS ABS: 0 10*3/uL (ref 0.0–0.7)
Eosinophils Relative: 0 %
HCT: 38.5 % (ref 36.0–46.0)
HEMOGLOBIN: 12.8 g/dL (ref 12.0–15.0)
Lymphocytes Relative: 24 %
Lymphs Abs: 2.7 10*3/uL (ref 0.7–4.0)
MCH: 29.8 pg (ref 26.0–34.0)
MCHC: 33.2 g/dL (ref 30.0–36.0)
MCV: 89.5 fL (ref 78.0–100.0)
MONOS PCT: 11 %
Monocytes Absolute: 1.2 10*3/uL — ABNORMAL HIGH (ref 0.1–1.0)
NEUTROS PCT: 65 %
Neutro Abs: 7.1 10*3/uL (ref 1.7–7.7)
Platelets: 320 10*3/uL (ref 150–400)
RBC: 4.3 MIL/uL (ref 3.87–5.11)
RDW: 12.7 % (ref 11.5–15.5)
WBC: 11 10*3/uL — AB (ref 4.0–10.5)

## 2016-01-29 LAB — COMPREHENSIVE METABOLIC PANEL
ALBUMIN: 3.3 g/dL — AB (ref 3.5–5.0)
ALK PHOS: 72 U/L (ref 38–126)
ALT: 21 U/L (ref 14–54)
ANION GAP: 9 (ref 5–15)
AST: 42 U/L — ABNORMAL HIGH (ref 15–41)
BUN: 11 mg/dL (ref 6–20)
CALCIUM: 8.6 mg/dL — AB (ref 8.9–10.3)
CHLORIDE: 104 mmol/L (ref 101–111)
CO2: 24 mmol/L (ref 22–32)
Creatinine, Ser: 0.69 mg/dL (ref 0.44–1.00)
GFR calc Af Amer: >60 mL/min (ref >60)
GFR calc non Af Amer: >60 mL/min (ref >60)
GLUCOSE: 122 mg/dL — AB (ref 65–99)
Potassium: 3 mmol/L — ABNORMAL LOW (ref 3.5–5.1)
SODIUM: 137 mmol/L (ref 135–145)
Total Bilirubin: 0.6 mg/dL (ref 0.3–1.2)
Total Protein: 6 g/dL — ABNORMAL LOW (ref 6.5–8.1)

## 2016-01-29 LAB — HEMOGLOBIN A1C
HEMOGLOBIN A1C: 6.5 % — AB (ref 4.8–5.6)
MEAN PLASMA GLUCOSE: 140 mg/dL

## 2016-01-29 LAB — APTT
aPTT: 74 seconds — ABNORMAL HIGH (ref 24–37)
aPTT: 73 seconds — ABNORMAL HIGH (ref 24–37)

## 2016-01-29 LAB — HEPARIN LEVEL (UNFRACTIONATED): HEPARIN UNFRACTIONATED: 0.96 [IU]/mL — AB (ref 0.30–0.70)

## 2016-01-29 LAB — PROTIME-INR
INR: 1.21 (ref 0.00–1.49)
Prothrombin Time: 15.5 seconds — ABNORMAL HIGH (ref 11.6–15.2)

## 2016-01-29 LAB — MAGNESIUM: MAGNESIUM: 1.8 mg/dL (ref 1.7–2.4)

## 2016-01-29 MED ORDER — RESOURCE THICKENUP CLEAR PO POWD
ORAL | Status: DC | PRN
  Filled 2016-01-29: qty 125

## 2016-01-29 MED ORDER — POTASSIUM CHLORIDE 20 MEQ PO PACK
40.0000 meq | PACK | Freq: Two times a day (BID) | ORAL | Status: AC
  Administered 2016-01-29 (×2): 40 meq via ORAL
  Filled 2016-01-29 (×2): qty 2

## 2016-01-29 NOTE — Progress Notes (Signed)
ANTICOAGULATION CONSULT NOTE - [place] for Apixaban >> Heparin Indication: atrial fibrillation  No Known Allergies  Patient Measurements: Height: 5\' 3"  (160 cm) Weight: 101 lb 6.6 oz (46 kg) IBW/kg (Calculated) : 52.4  Heparin Dosing Weight = 49.9 kg  Vital Signs: Temp: 98.9 F (37.2 C) (05/25 1426) Temp Source: Oral (05/25 1426) BP: 106/54 mmHg (05/25 1426) Pulse Rate: 63 (05/25 1426)  Labs:  Recent Labs  01/27/16 1446 01/27/16 2130 01/28/16 0551  01/28/16 0713 01/28/16 1400 01/28/16 2249 01/29/16 0700 01/29/16 1518  HGB 13.0  --  13.3  --   --   --   --  12.8  --   HCT 39.4  --  40.6  --   --   --   --  38.5  --   PLT 345  --  PLATELET CLUMPS NOTED ON SMEAR, COUNT APPEARS ADEQUATE  --   --   --   --  320  --   APTT  --   --   --   < > 27 28 47* 74* 73*  LABPROT 15.0 14.3  --   --  15.1  --   --  15.5*  --   INR 1.17 1.09  --   --  1.18  --   --  1.21  --   HEPARINUNFRC  --   --   --   --   --  0.50 0.66 0.96*  --   CREATININE 0.68  --  0.74  --   --   --   --  0.69  --   < > = values in this interval not displayed.  Estimated Creatinine Clearance: 40.7 mL/min (by C-G formula based on Cr of 0.69).   Medical History: Past Medical History  Diagnosis Date  . Hypercholesterolemia   . Complication of anesthesia     takes 3- 4 days to wake up  . Arthritis   . Sleep apnea   . Memory loss   . Depression   . Atrial fibrillation, persistent ([place]) 12/08/2015  . Syncope 12/08/2015  . Fatigue 12/08/2015    Medications:  Prescriptions prior to admission  Medication Sig Dispense Refill Last Dose  . apixaban (ELIQUIS) 5 MG TABS tablet Take 1 tablet (5 mg total) by mouth 2 (two) times daily. 60 tablet 11 01/26/2016 at Unknown time  . memantine (NAMENDA) 10 MG tablet Take 1 tablet (10 mg total) by mouth 2 (two) times daily. 60 tablet 11 01/26/2016 at Unknown time  . solifenacin (VESICARE) 5 MG tablet Take 1 tablet (5 mg total) by mouth daily. 30  tablet 6 01/26/2016 at Unknown time  . alendronate (FOSAMAX) 70 MG tablet Take 70 mg by mouth once a week.    Unknown at Unknown time  . Ascorbic Acid (VITAMIN C) 1000 MG tablet Take 1,000 mg by mouth daily.   Unknown at Unknown time  . Cholecalciferol (D3-1000) 1000 UNITS capsule Take 1,000 Units by mouth daily.   Unknown at Unknown time  . Cyanocobalamin (VITAMIN B-12) 1000 MCG SUBL Place 1,000 mcg under the tongue daily.   Unknown at Unknown time  . FLUoxetine (PROZAC) 20 MG capsule Take 20 mg by mouth daily.    Unknown at Unknown time  . Multiple Vitamins-Minerals (ALIVE WOMENS GUMMY PO) Take 2 capsules by mouth daily.   Unknown at Unknown time  . pyridOXINE (VITAMIN B-6) 100 MG tablet Take 100 mg by mouth daily.   Unknown at Unknown time  Assessment: 41 YOF with a history of chronic atrial fibrillation recently cardioverted a few weeks ago on Eliquis but is non-compliant reportedly missing 1-2 doses per month. She presents with confusion and AMS and is being evaluated for r/o meningitis.  Pharmacy consulted to resume apixaban however no doses given.  Now to transition to heparin in the event procedures are needed.  She takes apixaban 5 mg twice daily which was prescribed by her cardiologist. Based on her weight and age, she qualifies for dose adjustment to 2.5mg  PO BID when/if resumed.  Last dose 5/22.    Apixaban can falsely elevate the anti-Xa level (heparin level) assay and until levels correlate with aptt, appt will need to be used to guide therapy dose adjustments.  Last aPTT remains therapeutic at 73. Will plan to continue current rate until 0500 tomorrow.  Goal of Therapy:  Heparin level 0.3-0.7 aPTT 66-102 Monitor platelets by anticoagulation protocol: Yes   Plan:  Continue Heparin at 800 units/hr (stop 5/26 at 0500) Monitor CBC, s/s of bleed  Elenor Quinones, PharmD, [facility] Clinical Pharmacist Pager [phone] 01/29/2016 4:27 PM

## 2016-01-29 NOTE — Progress Notes (Signed)
[facility] Progress Note  Patient: Linda Morse [id number]   PCP: Tawanna Solo, MD DOB: 02-04-1934   DOA: 01/27/2016   DOS: 01/29/2016   Date of Service: the patient was seen and examined on 01/29/2016  Subjective: Patient is more awake this morning although not oriented and is able to carry out a conversation and answering questions appropriately. Denies any acute complaints, no overnight acute events Nutrition: At present nothing by mouth pending swallowing evaluation  Brief hospital course: Patient with past medical history of chronic A. fib recently cardioverted in 01/08/2016, on adequate is noncompliant, dyslipidemia, dementia per family, sleep apnea, COPD, not on C Pap, not on home oxygen, was admitted on 01/27/2016, with complaint of confusion with fall. Patient has history of gait ataxia as well as dementia at her baseline and follows up with neurology as an outpatient. Patient was found to have acute encephalopathy of unknown etiology. On 01/29/2016 patient shows significant improvement in mentation.  Currently further plan is continue further workup for the encephalopathy.  Assessment and Plan: 1. Acute encephalopathy, improving Etiology still unclear, likely multifactorial MRI brain negative for stroke shows evidence of hydrocephalus although as per neurology no evidence of hydrocephalus and this appears to be ventriculomegaly secondary to cerebral atrophy. Appreciate neurosurgery input. EEG shows evidence of triphasic wave as well as focal right hemispheric dysfunction likely secondary to subdural hygroma. UA normal, lactic acid normal, TSH normal, ammonia normal  Lumbar puncture per neurology, likely on 01/28/2016, neurology will decide on discontinuation of the heparin. Patient did spike a fever on 01/28/2016 and with leukocytosis patient is given an empiric antibiotics for CNS infection. Continue gentle IV hydration. Nothing by mouth pending speech evaluation.  Requesting RN to ensure patient is seen by speech therapy today. PTOT consult pending. When necessary Haldol. Avoid psychotropic medications at present.  2. History of persistent A. fib. Recently cardioverted on 01/08/2016. For anticoagulation noncompliant. MRI negative for acute stroke. Given the recent cardioversion patient will require 6 weeks of anticoagulation from the cardioversion and since patient only has a hygroma without any ongoing active bleeding, anticoagulation was deemed not contraindicated and therefore I would start the patient on heparin while the patient remains nothing by mouth.  3. B-12 deficiency. Changing oral B-12 to subcutaneous in the hospital.  4. History of COPD, history of sleep apnea. ABG does not show any evidence of hypercarbia. No evidence of exacerbation at present. Monitor on telemetry.  5. Left elbow swelling.  Patient did not have any pain this morning. X-ray of the elbow was also negative for any acute fracture.  Continue to monitor.  Pain management: When necessary Tylenol Activity: Consulted physical therapy Bowel regimen: last BM 01/27/2016 Diet: Nothing by mouth pending swallowing DVT Prophylaxis: on therapeutic anticoagulation.  Advance goals of care discussion: DNR DNI  Family Communication: no family was present at bedside, at the time of interview.    Disposition:  Discharge to a SNF, pending PTOT consult and improvement in mentation Expected discharge date: 01/31/2016  Consultants: neurology  Procedures: EEG  Antibiotics: Anti-infectives    Start     Dose/Rate Route Frequency Ordered Stop   01/29/16 1000  vancomycin (VANCOCIN) IVPB 750 mg/150 ml premix     750 mg 150 mL/hr over 60 Minutes Intravenous Every 24 hours 01/28/16 0659     01/29/16 0400  acyclovir (ZOVIRAX) 440 mg in dextrose 5 % 100 mL IVPB     10 mg/kg  43.9 kg 108.8 mL/hr over 60 Minutes Intravenous Every 12  hours 01/28/16 1911     01/28/16 1900   cefTRIAXone (ROCEPHIN) 2 g in dextrose 5 % 50 mL IVPB     2 g 100 mL/hr over 30 Minutes Intravenous Every 12 hours 01/28/16 0659     01/28/16 1600  acyclovir (ZOVIRAX) 440 mg in dextrose 5 % 100 mL IVPB     10 mg/kg  43.9 kg 108.8 mL/hr over 60 Minutes Intravenous STAT 01/28/16 1550 01/28/16 1706   01/28/16 1300  ampicillin (OMNIPEN) 1 g in sodium chloride 0.9 % 50 mL IVPB     1 g 150 mL/hr over 20 Minutes Intravenous Every 6 hours 01/28/16 0659     01/28/16 0700  cefTRIAXone (ROCEPHIN) 2 g in dextrose 5 % 50 mL IVPB     2 g 100 mL/hr over 30 Minutes Intravenous  Once 01/28/16 0629 01/28/16 0806   01/28/16 0700  vancomycin (VANCOCIN) IVPB 1000 mg/200 mL premix     1,000 mg 200 mL/hr over 60 Minutes Intravenous  Once 01/28/16 0629 01/28/16 1025   01/28/16 0700  ampicillin (OMNIPEN) 2 g in sodium chloride 0.9 % 50 mL IVPB     2 g 150 mL/hr over 20 Minutes Intravenous  Once 01/28/16 0629 01/28/16 0730        Intake/Output Summary (Last 24 hours) at 01/29/16 0755 Last data filed at 01/28/16 1902  Gross per 24 hour  Intake 1139.85 ml  Output      0 ml  Net 1139.85 ml   Filed Weights   01/27/16 1417 01/27/16 2015 01/29/16 0100  Weight: 49.896 kg (110 lb) 43.9 kg (96 lb 12.5 oz) 46 kg (101 lb 6.6 oz)    Objective: Physical Exam: Filed Vitals:   01/28/16 1422 01/28/16 2226 01/29/16 0100 01/29/16 0504  BP: 129/52 174/70 172/73 123/60  Pulse: 79 84 84 73  Temp:  98.9 F (37.2 C) 98.9 F (37.2 C) 97.8 F (36.6 C)  TempSrc:   Axillary   Resp:  17 18 18   Height:      Weight:   46 kg (101 lb 6.6 oz)   SpO2:  98% 95% 94%    General: Awake, Alert not oriented improvement over yesterday Eyes: PERRL, Conjunctiva normal ENT: Oral Mucosa clear moist. Neck: no JVD, no Abnormal Mass Or lumps Cardiovascular: S1 and S2 Present, aortic systolic Murmur, Peripheral Pulses Present Respiratory: Bilateral Air entry equal and Decreased, Clear to Auscultation, no Crackles, no  wheezes Abdomen: Bowel Sound present, Soft and no tenderness Skin: no redness, no Rash  Extremities: no Pedal edema, no calf tenderness Neurologic: Mental status awake, Following command, carrying out conversation, not oriented, Cranial Nerves PERRL, EOM normal and present, Motor strength 4/5 bilaterally Sensation present to light touch Reflexes difficult to assess knee and biceps, babinski equivocal,  Cerebellar test unable to perform  Data Reviewed: CBC:  Recent Labs Lab 01/27/16 1446 01/28/16 0551 01/29/16 0700  WBC 14.8* 11.0* 11.0*  NEUTROABS  --   --  7.1  HGB 13.0 13.3 12.8  HCT 39.4 40.6 38.5  MCV 90.4 90.4 89.5  PLT 345 PLATELET CLUMPS NOTED ON SMEAR, COUNT APPEARS ADEQUATE 99991111   Basic Metabolic Panel:  Recent Labs Lab 01/27/16 1446 01/28/16 0551  NA 138 135  K 3.8 3.5  CL 103 102  CO2 26 21*  GLUCOSE 101* 116*  BUN 12 13  CREATININE 0.68 0.74  CALCIUM 9.5 9.0    Liver Function Tests:  Recent Labs Lab 01/27/16 1446  AST 26  ALT 16  ALKPHOS 86  BILITOT 0.6  PROT 6.6  ALBUMIN 4.1   No results for input(s): LIPASE, AMYLASE in the last 168 hours.  Recent Labs Lab 01/27/16 2117  AMMONIA 18   Coagulation Profile:  Recent Labs Lab 01/27/16 1446 01/27/16 2130 01/28/16 0713 01/29/16 0700  INR 1.17 1.09 1.18 1.21   Cardiac Enzymes: No results for input(s): CKTOTAL, CKMB, CKMBINDEX, TROPONINI in the last 168 hours. BNP (last 3 results) No results for input(s): PROBNP in the last 8760 hours.  CBG: No results for input(s): GLUCAP in the last 168 hours.  Studies: Dg Elbow 2 Views Left  01/29/2016  CLINICAL DATA:  Acute onset of left elbow pain and swelling. Initial encounter. EXAM: LEFT ELBOW - 2 VIEW COMPARISON:  None. FINDINGS: There is no evidence of fracture or dislocation. The visualized joint spaces are preserved. No significant joint effusion is identified. The soft tissues are unremarkable in appearance. IMPRESSION: No evidence of  fracture or dislocation. Electronically Signed   By: Garald Balding M.D.   On: 01/29/2016 02:42     Scheduled Meds: . acyclovir  10 mg/kg Intravenous Q12H  . ampicillin (OMNIPEN) IV  1 g Intravenous Q6H  . antiseptic oral rinse  7 mL Mouth Rinse q12n4p  . cefTRIAXone (ROCEPHIN)  IV  2 g Intravenous Q12H  . cyanocobalamin  1,000 mcg Subcutaneous Daily  . darifenacin  7.5 mg Oral Daily  . vancomycin  750 mg Intravenous Q24H   Continuous Infusions: . dextrose 5 % and 0.9% NaCl 50 mL/hr at 01/29/16 0339  . heparin 800 Units/hr (01/28/16 2315)   PRN Meds: acetaminophen **OR** acetaminophen, haloperidol lactate, hydrALAZINE, ondansetron **OR** ondansetron (ZOFRAN) IV  Time spent: 30 minutes  Author: Berle Mull, MD [facility] Pager: [phone] 01/29/2016 7:55 AM  If 7PM-7AM, please contact night-coverage at [email or web address], password [facility]

## 2016-01-29 NOTE — Evaluation (Signed)
Clinical/Bedside Swallow Evaluation Patient Details  Name: Linda Morse MRN: [id number] Date of Birth: February 16, 1934  Today's Date: 01/29/2016 Time: SLP Start Time (ACUTE ONLY): 0900 SLP Stop Time (ACUTE ONLY): 1015 SLP Time Calculation (min) (ACUTE ONLY): 75 min  Past Medical History:  Past Medical History  Diagnosis Date  . Hypercholesterolemia   . Complication of anesthesia     takes 3- 4 days to wake up  . Arthritis   . Sleep apnea   . Memory loss   . Depression   . Atrial fibrillation, persistent ([place]) 12/08/2015  . Syncope 12/08/2015  . Fatigue 12/08/2015   Past Surgical History:  Past Surgical History  Procedure Laterality Date  . Back surgery    . Knee surgery    . Eye surgery Bilateral     catarats  . Abdominal hysterectomy    . Orif wrist fracture Right 06/16/2013    Procedure: OPEN REDUCTION INTERNAL FIXATION (ORIF) RIGHT WRIST FRACTURE ;  Surgeon: Roseanne Kaufman, MD;  Location: [facility];  Service: Orthopedics;  Laterality: Right;  . Neck surgery    . Tee without cardioversion [name] 01/08/2016    Procedure: TRANSESOPHAGEAL ECHOCARDIOGRAM (TEE);  Surgeon: Jerline Pain, MD;  Location: [facility];  Service: Cardiovascular;  Laterality: [name];  . Cardioversion [name] 01/08/2016    Procedure: CARDIOVERSION;  Surgeon: Jerline Pain, MD;  Location: [facility] ENDOSCOPY;  Service: Cardiovascular;  Laterality: [name];   HPI:  Patient with past medical history of chronic A. fib recently cardioverted in 01/08/2016, on adequate is noncompliant, dyslipidemia, dementia per family, sleep apnea, COPD, not on C Pap, not on home oxygen, was admitted on 01/27/2016, with complaint of confusion with fall. Patient has history of gait ataxia as well as dementia at her baseline and follows up with neurology as an outpatient. Patient was found to have acute encephalopathy of unknown etiology. On 01/29/2016 patient shows significant improvement in mentation.   Assessment / Plan / Recommendation Clinical  Impression  Pt demonstrates sustained arousal for PO and attempts to self feed, though awareness of PO is intermittent. Oral transit of liquids is slow and there is concern for delayed swallow which lead to coughing following cup and straw sips at bedside. Pt tolerted puree very well. Recommend initiating puree and nectar thick liquids until awareness and initiation of swallow trigger is more consistent. SLP will f/u for tolerance and advanced trials as mentation continues to improve.     Aspiration Risk  Moderate aspiration risk    Diet Recommendation Dysphagia 1 (Puree);Nectar-thick liquid   Liquid Administration via: Cup;Straw Medication Administration: Whole meds with puree Supervision: Staff to assist with self feeding Compensations: Slow rate;Small sips/bites;Minimize environmental distractions Postural Changes: Seated upright at 90 degrees    Other  Recommendations Oral Care Recommendations: Oral care BID   Follow up Recommendations  Skilled Nursing facility    Frequency and Duration min 2x/week  2 weeks       Prognosis Prognosis for Safe Diet Advancement: Good Barriers to Reach Goals: Cognitive deficits      Swallow Study   General HPI: Patient with past medical history of chronic A. fib recently cardioverted in 01/08/2016, on adequate is noncompliant, dyslipidemia, dementia per family, sleep apnea, COPD, not on C Pap, not on home oxygen, was admitted on 01/27/2016, with complaint of confusion with fall. Patient has history of gait ataxia as well as dementia at her baseline and follows up with neurology as an outpatient. Patient was found to have acute encephalopathy of unknown  etiology. On 01/29/2016 patient shows significant improvement in mentation. Type of Study: Bedside Swallow Evaluation Previous Swallow Assessment: no Diet Prior to this Study: NPO Temperature Spikes Noted: No Respiratory Status: Room air History of Recent Intubation: No Behavior/Cognition:  Alert;Confused;Requires cueing Oral Cavity Assessment: Within Functional Limits Oral Care Completed by SLP: No Oral Cavity - Dentition: Adequate natural dentition Vision: Functional for self-feeding Self-Feeding Abilities: Needs assist Patient Positioning: Upright in bed Baseline Vocal Quality: Normal Volitional Cough: Cognitively unable to elicit Volitional Swallow: Unable to elicit    Oral/Motor/Sensory Function Overall Oral Motor/Sensory Function: Within functional limits   Ice Chips Ice chips: Impaired Presentation: Spoon Oral Phase Functional Implications: Prolonged oral transit Pharyngeal Phase Impairments: Suspected delayed Swallow   Thin Liquid Thin Liquid: Impaired Presentation: Cup;Straw;Self Fed Oral Phase Functional Implications: Prolonged oral transit Pharyngeal  Phase Impairments: Suspected delayed Swallow;Cough - Immediate    Nectar Thick Nectar Thick Liquid: Not tested   Honey Thick Honey Thick Liquid: Not tested   Puree Puree: Within functional limits   Solid   GO   Solid: Impaired Oral Phase Impairments: Impaired mastication;Poor awareness of bolus Oral Phase Functional Implications: Impaired mastication       Herbie Baltimore, MA CCC-SLP [phone]  Shann Merrick, Katherene Ponto 01/29/2016,10:34 AM

## 2016-01-29 NOTE — Progress Notes (Signed)
ANTICOAGULATION CONSULT NOTE - Follow-Up Consult  Pharmacy Consult for Apixaban >> Heparin Indication: atrial fibrillation  No Known Allergies  Patient Measurements: Height: 5\' 3"  (160 cm) Weight: 101 lb 6.6 oz (46 kg) IBW/kg (Calculated) : 52.4  Heparin Dosing Weight = 49.9 kg  Vital Signs: Temp: 97.8 F (36.6 C) (05/25 0504) Temp Source: Axillary (05/25 0100) BP: 123/60 mmHg (05/25 0504) Pulse Rate: 73 (05/25 0504)  Labs:  Recent Labs  01/27/16 1446 01/27/16 2130 01/28/16 0551  01/28/16 0713 01/28/16 1400 01/28/16 2249 01/29/16 0700  HGB 13.0  --  13.3  --   --   --   --  12.8  HCT 39.4  --  40.6  --   --   --   --  38.5  PLT 345  --  PLATELET CLUMPS NOTED ON SMEAR, COUNT APPEARS ADEQUATE  --   --   --   --  320  APTT  --   --   --   < > 27 28 47* 74*  LABPROT 15.0 14.3  --   --  15.1  --   --  15.5*  INR 1.17 1.09  --   --  1.18  --   --  1.21  HEPARINUNFRC  --   --   --   --   --  0.50 0.66 0.96*  CREATININE 0.68  --  0.74  --   --   --   --  0.69  < > = values in this interval not displayed.  Estimated Creatinine Clearance: 40.7 mL/min (by C-G formula based on Cr of 0.69).   Medical History: Past Medical History  Diagnosis Date  . Hypercholesterolemia   . Complication of anesthesia     takes 3- 4 days to wake up  . Arthritis   . Sleep apnea   . Memory loss   . Depression   . Atrial fibrillation, persistent ([place]) 12/08/2015  . Syncope 12/08/2015  . Fatigue 12/08/2015    Medications:  Prescriptions prior to admission  Medication Sig Dispense Refill Last Dose  . apixaban (ELIQUIS) 5 MG TABS tablet Take 1 tablet (5 mg total) by mouth 2 (two) times daily. 60 tablet 11 01/26/2016 at Unknown time  . memantine (NAMENDA) 10 MG tablet Take 1 tablet (10 mg total) by mouth 2 (two) times daily. 60 tablet 11 01/26/2016 at Unknown time  . solifenacin (VESICARE) 5 MG tablet Take 1 tablet (5 mg total) by mouth daily. 30 tablet 6 01/26/2016 at Unknown time  . alendronate  (FOSAMAX) 70 MG tablet Take 70 mg by mouth once a week.    Unknown at Unknown time  . Ascorbic Acid (VITAMIN C) 1000 MG tablet Take 1,000 mg by mouth daily.   Unknown at Unknown time  . Cholecalciferol (D3-1000) 1000 UNITS capsule Take 1,000 Units by mouth daily.   Unknown at Unknown time  . Cyanocobalamin (VITAMIN B-12) 1000 MCG SUBL Place 1,000 mcg under the tongue daily.   Unknown at Unknown time  . FLUoxetine (PROZAC) 20 MG capsule Take 20 mg by mouth daily.    Unknown at Unknown time  . Multiple Vitamins-Minerals (ALIVE WOMENS GUMMY PO) Take 2 capsules by mouth daily.   Unknown at Unknown time  . pyridOXINE (VITAMIN B-6) 100 MG tablet Take 100 mg by mouth daily.   Unknown at Unknown time    Assessment: 89 YOF with a history of chronic atrial fibrillation recently cardioverted a few weeks ago on Eliquis but is non-compliant  reportedly missing 1-2 doses per month. She presents with confusion and AMS and is being evaluated for r/o meningitis.  Pharmacy consulted to resume apixaban however no doses given.  Now to transition to heparin in the event procedures are needed.  She takes apixaban 5 mg twice daily which was prescribed by her cardiologist. Based on her weight and age, she qualifies for dose adjustment to 2.5mg  PO BID when/if resumed.  Last dose 5/22.    Apixaban can falsely elevate the anti-Xa level (heparin level) assay and until levels correlate with aptt, appt will need to be used to guide therapy dose adjustments.  aPTT is now therapeutic on heparin 800 units/hr.  Will need to repeat this afternoon to confirm.  Noted plan to stop heparin 5/26 AM in anticipation of LP.  Goal of Therapy:  Heparin level 0.3-0.7 aPTT 66-102 Monitor platelets by anticoagulation protocol: Yes   Plan:  Continue Heparin at 800 units/hr - stop 5/26 at 0500 aPTT at 1500 today to confirm therapeutic Daily aPTT/HL/CBC Monitor s/sx of bleeding  [employer], Pharm.D., BCPS Clinical Pharmacist Pager  [phone] 01/29/2016 12:55 PM

## 2016-01-29 NOTE — Evaluation (Signed)
Physical Therapy Evaluation Patient Details Name: Linda Morse MRN: [id number] DOB: 1933-12-19 Today's Date: 01/29/2016   History of Present Illness  Pt adm with acute encephalopathy of unknown etiology. PMH - dementia, COPD, afib  Clinical Impression  Pt presents to PT with lethargy and AMS which is resulting in poor mobility. Expect that as pt's mental status improves her mobility will also improve which makes it difficult to determine appropriate dc destination. Currently pt requires total care but hopefully that will improve with improved mental status.    Follow Up Recommendations Other (comment) (To be assessed when more alert)    Equipment Recommendations  Other (comment) (To be assessed when more alert)    Recommendations for Other Services       Precautions / Restrictions Precautions Precautions: Fall      Mobility  Bed Mobility Overal bed mobility: Needs Assistance Bed Mobility: Supine to Sit;Sit to Supine     Supine to sit: Total assist Sit to supine: Total assist   General bed mobility comments: Assist with all aspects.  Transfers                    Ambulation/Gait                Stairs            Wheelchair Mobility    Modified Rankin (Stroke Patients Only)       Balance Overall balance assessment: Needs assistance Sitting-balance support: No upper extremity supported;Feet supported Sitting balance-Leahy Scale: Zero Sitting balance - Comments: Sat EOB x 7-8 minutes with total Assist. No balance reactions.                                     Pertinent Vitals/Pain Pain Assessment: Faces Faces Pain Scale: No hurt    Home Living Family/patient expects to be discharged to:: Private residence Living Arrangements: Children Available Help at Discharge: Family Type of Home: House Home Access: Stairs to enter Entrance Stairs-Rails: Left Entrance Stairs-Number of Steps: 1 Home Layout: One level Home  Equipment: Walker - 2 wheels;Cane - single point Additional Comments: Pt arouses briefly but quickly back to sleep    Prior Function           Comments: Per chart pt amb with walker. Unsure if pt requiring assist     Hand Dominance   Dominant Hand: Right    Extremity/Trunk Assessment   Upper Extremity Assessment: RUE deficits/detail;LUE deficits/detail RUE Deficits / Details: active movement noted. Not following commands for manual muscle testing     LUE Deficits / Details: active movement noted. Not following commands for manual muscle testing   Lower Extremity Assessment: RLE deficits/detail;LLE deficits/detail RLE Deficits / Details: Active movement noted. Not following commands. LLE Deficits / Details: Active movement noted. Not following commands.     Communication      Cognition Arousal/Alertness: Lethargic Behavior During Therapy: Flat affect Overall Cognitive Status: No family/caregiver present to determine baseline cognitive functioning Area of Impairment: Following commands       Following Commands:  (Not following commands)            General Comments      Exercises        Assessment/Plan    PT Assessment Patient needs continued PT services  PT Diagnosis Difficulty walking;Altered mental status   PT Problem List Decreased strength;Decreased activity tolerance;Decreased balance;Decreased mobility;Decreased cognition  PT Treatment Interventions DME instruction;Gait training;Functional mobility training;Therapeutic activities;Therapeutic exercise;Balance training;Patient/family education   PT Goals (Current goals can be found in the Care Plan section) Acute Rehab PT Goals Patient Stated Goal: Pt unable PT Goal Formulation: Patient unable to participate in goal setting Time For Goal Achievement: 02/12/16 Potential to Achieve Goals: Fair    Frequency Min 3X/week   Barriers to discharge        Co-evaluation               End of  Session   Activity Tolerance: Patient limited by lethargy Patient left: in bed;with call bell/phone within reach;with bed alarm set           Time: [id number] PT Time Calculation (min) (ACUTE ONLY): 17 min   Charges:   PT Evaluation $PT Eval Moderate Complexity: 1 Procedure     PT G Codes:        Zuzanna Maroney Feb 28, 2016, 9:19 AM [facility] PT [phone]

## 2016-01-29 NOTE — Progress Notes (Signed)
                                                                               Neurology Progress Note.  Subjective:  She answers most of my questions appropriately, but with some delay in response. She keeps trying to remove the mittens on her hands.   Vitals- Filed Vitals:   01/29/16 0100 01/29/16 0504  BP: 172/73 123/60  Pulse: 84 73  Temp: 98.9 F (37.2 C) 97.8 F (36.6 C)  Resp: 18 18    Physical Exam-  GENERAL- Sleeping but easily arousable, appears as stated age, not in any distress. HEENT- Atraumatic, normocephalic, some neck stiffiness, Moist oral mucosa. CARDIAC- RRR, no murmurs. NEURO- oriented to person and place, correct month and season of year, but no year.  No facial droop, not co-op for other Cr N examination due to mental status,  RUE- 5/5, LUE- 3+/5, RLE - 5/5 and LLE- pt was not co-operative. ?senation.   Assessment/Plan:  Acute Encephalopathy- Most likely meningitis. Possibly Bacterial, viral, also consider Aseptic etiology, Vs Viral. Procal- Unremarkable, making an infectious etiology less likely. Per MRI- Obstructive Hydrocephalous, but more likely ventriculomegaly 2/2 cerebral atrophy, seen by neurosurg. EEG- consistent with a focal right hemisperic dysfunction in the setting of a generalized non-specific cerebral dysfunction.  - She is presently on Empiric broad spectrum Antibiotics, including Acyclovir. - Awaiting LP, Hold Eliquis, last took medication- ~3pm Monday  - Now on heparin gtt, D/c 6hrs before LP, at about 5am 2/26.  HypoK- Will replete. Check Mag.    LOS: 2 days   Bethena Roys, MD 01/29/2016, 10:10 AM

## 2016-01-30 ENCOUNTER — Inpatient Hospital Stay (HOSPITAL_COMMUNITY): Payer: Medicare Other

## 2016-01-30 LAB — CSF CELL COUNT WITH DIFFERENTIAL
LYMPHS CSF: 56 % (ref 40–80)
Monocyte-Macrophage-Spinal Fluid: 20 % (ref 15–45)
RBC COUNT CSF: 445 /mm3 — AB
Segmented Neutrophils-CSF: 24 % — ABNORMAL HIGH (ref 0–6)
TUBE #: 2
WBC, CSF: 4 /mm3 (ref 0–5)

## 2016-01-30 LAB — BASIC METABOLIC PANEL WITH GFR
Anion gap: 6 (ref 5–15)
BUN: 7 mg/dL (ref 6–20)
CO2: 27 mmol/L (ref 22–32)
Calcium: 8.8 mg/dL — ABNORMAL LOW (ref 8.9–10.3)
Chloride: 110 mmol/L (ref 101–111)
Creatinine, Ser: 0.55 mg/dL (ref 0.44–1.00)
GFR calc Af Amer: >60 mL/min
GFR calc non Af Amer: >60 mL/min
Glucose, Bld: 109 mg/dL — ABNORMAL HIGH (ref 65–99)
Potassium: 3.5 mmol/L (ref 3.5–5.1)
Sodium: 143 mmol/L (ref 135–145)

## 2016-01-30 LAB — APTT: aPTT: 27 s (ref 24–37)

## 2016-01-30 LAB — CBC
HCT: 35.8 % — ABNORMAL LOW (ref 36.0–46.0)
Hemoglobin: 12.1 g/dL (ref 12.0–15.0)
MCH: 30 pg (ref 26.0–34.0)
MCHC: 33.8 g/dL (ref 30.0–36.0)
MCV: 88.8 fL (ref 78.0–100.0)
PLATELETS: 328 10*3/uL (ref 150–400)
RBC: 4.03 MIL/uL (ref 3.87–5.11)
RDW: 12.7 % (ref 11.5–15.5)
WBC: 9.2 10*3/uL (ref 4.0–10.5)

## 2016-01-30 LAB — PROTEIN, CSF: TOTAL PROTEIN, CSF: 27 mg/dL (ref 15–45)

## 2016-01-30 LAB — MAGNESIUM: Magnesium: 2 mg/dL (ref 1.7–2.4)

## 2016-01-30 LAB — PROTIME-INR
INR: 1.06 (ref 0.00–1.49)
Prothrombin Time: 14 s (ref 11.6–15.2)

## 2016-01-30 LAB — GLUCOSE, CSF: Glucose, CSF: 65 mg/dL (ref 40–70)

## 2016-01-30 MED ORDER — APIXABAN 2.5 MG PO TABS
2.5000 mg | ORAL_TABLET | Freq: Two times a day (BID) | ORAL | Status: DC
Start: 2016-01-31 — End: 2016-02-04
  Administered 2016-01-31 – 2016-02-04 (×9): 2.5 mg via ORAL
  Filled 2016-01-30 (×8): qty 1

## 2016-01-30 MED ORDER — CETYLPYRIDINIUM CHLORIDE 0.05 % MT LIQD
7.0000 mL | Freq: Two times a day (BID) | OROMUCOSAL | Status: DC
  Administered 2016-01-30 – 2016-02-04 (×11): 7 mL via OROMUCOSAL

## 2016-01-30 MED ORDER — HEPARIN (PORCINE) IN NACL 100-0.45 UNIT/ML-% IJ SOLN
950.0000 [IU]/h | INTRAMUSCULAR | Status: DC
  Administered 2016-01-30: 800 [IU]/h via INTRAVENOUS
  Filled 2016-01-30: qty 250

## 2016-01-30 NOTE — Progress Notes (Signed)
Physical Therapy Treatment Patient Details Name: Linda Morse MRN: [id number] DOB: 07-22-1934 Today's Date: 01/30/2016    History of Present Illness Pt adm with acute encephalopathy, lumbar puncture pending, meningitis suspected. Marland Kitchen PMH - dementia, COPD, afib.     PT Comments    Pt alert but confused. She stated she knows she's in the hospital but then stated she was going to walk to her kitchen to make some coffee then go sit on her porch. Max assist for supine to sit, posterior lean in sitting and standing. Performed reaching activities sitting on edge of bed, stood briefly with RW however unable to come to full upright position. SNF recommended.   Follow Up Recommendations  SNF     Equipment Recommendations  Other (comment) (To be determined depending on progress)    Recommendations for Other Services       Precautions / Restrictions Precautions Precautions: Fall Restrictions Weight Bearing Restrictions: No    Mobility  Bed Mobility Overal bed mobility: Needs Assistance Bed Mobility: Supine to Sit;Sit to Supine     Supine to sit: Max assist Sit to supine: Total assist   General bed mobility comments: Assist to raise trunk and advance BLEs.   Transfers Overall transfer level: Needs assistance Equipment used: Rolling walker (2 wheeled) Transfers: Sit to/from Stand Sit to Stand: Total assist         General transfer comment: pt 25%, assist to rise and steady due to posterior lean, verbal/manual cues for hand placement, pt easily distracted from task, unable to come to full upright position  Ambulation/Gait                 Stairs            Wheelchair Mobility    Modified Rankin (Stroke Patients Only)       Balance   Sitting-balance support: Feet supported;Bilateral upper extremity supported Sitting balance-Leahy Scale: Poor Sitting balance - Comments: Sat EOB x 7 minutes with posterior lean, able to actively come to neutral with  forward reaching activity, Posterior lean in standing with RW as well (unable to come to neutral).                             Cognition Arousal/Alertness: Awake/alert Behavior During Therapy: WFL for tasks assessed/performed Overall Cognitive Status: No family/caregiver present to determine baseline cognitive functioning Area of Impairment: Memory;Orientation Orientation Level: Situation     Following Commands: Follows one step commands consistently (Not following commands)       General Comments: stated she's at the hospital but then stated she was getting up to make coffee in her kitchen and go out to her porch    Exercises      General Comments        Pertinent Vitals/Pain Pain Assessment: No/denies pain    Home Living                      Prior Function            PT Goals (current goals can now be found in the care plan section) Acute Rehab PT Goals Patient Stated Goal: Pt unable PT Goal Formulation: Patient unable to participate in goal setting Time For Goal Achievement: 02/12/16 Potential to Achieve Goals: Fair Progress towards PT goals: Progressing toward goals    Frequency  Min 3X/week    PT Plan Discharge plan needs to be updated  Co-evaluation             End of Session Equipment Utilized During Treatment: Gait belt Activity Tolerance: Other (comment) (limited by confusion) Patient left: in bed;with call bell/phone within reach;with bed alarm set     Time: [id number] PT Time Calculation (min) (ACUTE ONLY): 16 min  Charges:  $Therapeutic Activity: 8-22 mins                    G Codes:      Philomena Doheny 01/30/2016, 1:01 PM [phone]

## 2016-01-30 NOTE — Procedures (Signed)
Indication: meningitis  Risks of the procedure were dicussed with the patient including post-LP headache, bleeding, infection, weakness/numbness of legs(radiculopathy), death.  The patient/patient's proxy agreed and written consent was obtained.   The patient was prepped and draped, and using sterile technique a 20 gauge quinke spinal needle was inserted in the L3-4 space without return, the L5-S1 space was tried without return.   Will arrange under fluoro.  Roland Rack, MD [facility] [phone]  If 7pm- 7am, please page neurology on call as listed in [place].

## 2016-01-30 NOTE — Progress Notes (Signed)
[facility] Progress Note  Patient: Linda Morse [id number]   PCP: Tawanna Solo, MD DOB: 04/20/1934   DOA: 01/27/2016   DOS: 01/30/2016   Date of Service: the patient was seen and examined on 01/30/2016  Subjective: Continues to show improvement in her mentation. Denies having any acute complaint. No nausea no vomiting. Nutrition: Tolerating dysphagia 1 diet  Brief hospital course: Patient with past medical history of chronic A. fib recently cardioverted in 01/08/2016, on adequate is noncompliant, dyslipidemia, dementia per family, sleep apnea, COPD, not on C Pap, not on home oxygen, was admitted on 01/27/2016, with complaint of confusion with fall. Patient has history of gait ataxia as well as dementia at her baseline and follows up with neurology as an outpatient. Patient was found to have acute encephalopathy of unknown etiology. Since 01/29/2016 patient shows significant improvement in mentation.  Currently further plan is continue further workup for the encephalopathy.  Assessment and Plan: 1. Acute encephalopathy, improving Etiology still unclear, likely multifactorial MRI brain negative for stroke shows evidence of hydrocephalus although as per neurology no evidence of hydrocephalus and this appears to be ventriculomegaly secondary to cerebral atrophy. Appreciate neurosurgery input. EEG shows evidence of triphasic wave as well as focal right hemispheric dysfunction likely secondary to subdural hygroma. UA normal, lactic acid normal, TSH normal, ammonia normal Lumbar puncture on 01/30/2016. Results pending. Heparin overnight and atelectasis starting 01/31/2016.  Patient did spike a fever on 01/28/2016 and with leukocytosis patient is given an empiric antibiotics for CNS infection. Continue gentle IV hydration. Dysphagia type I diet was advanced yesterday. Today dysphagia type III diet PTOT consult pending. When necessary Haldol. Avoid psychotropic medications at  present.  2. History of persistent A. fib. Recently cardioverted on 01/08/2016. For anticoagulation noncompliant. MRI negative for acute stroke. Given the recent cardioversion patient will require 6 weeks of anticoagulation from the cardioversion and since patient only has a hygroma without any ongoing active bleeding, anticoagulation was deemed not contraindicated and therefore patient has been on heparin. Since the patient is able to tolerate the diet will be starting back on Paxil been starting 01/31/2016  3. B-12 deficiency. Changing oral B-12 to subcutaneous in the hospital., Change to oral 01/31/2016  4. History of COPD, history of sleep apnea. ABG does not show any evidence of hypercarbia. No evidence of exacerbation at present. Monitor on telemetry.  5. Left elbow swelling.  Patient did not have any pain this morning. X-ray of the elbow was also negative for any acute fracture.  Continue to monitor.  Pain management: When necessary Tylenol Activity: Consulted physical therapy Bowel regimen: last BM 01/30/2016 Diet: Nothing by mouth pending swallowing DVT Prophylaxis: on therapeutic anticoagulation.  Advance goals of care discussion: DNR DNI  Family Communication: no family was present at bedside, at the time of interview.    Disposition:  Discharge to a SNF, pending PTOT consult and improvement in mentation Expected discharge date: 02/02/2016  Consultants: neurology  Procedures: EEG  Antibiotics: Anti-infectives    Start     Dose/Rate Route Frequency Ordered Stop   01/29/16 1000  vancomycin (VANCOCIN) IVPB 750 mg/150 ml premix     750 mg 150 mL/hr over 60 Minutes Intravenous Every 24 hours 01/28/16 0659     01/29/16 0400  acyclovir (ZOVIRAX) 440 mg in dextrose 5 % 100 mL IVPB     10 mg/kg  43.9 kg 108.8 mL/hr over 60 Minutes Intravenous Every 12 hours 01/28/16 1911     01/28/16 1900  cefTRIAXone (ROCEPHIN)  2 g in dextrose 5 % 50 mL IVPB     2 g 100 mL/hr over  30 Minutes Intravenous Every 12 hours 01/28/16 0659     01/28/16 1600  acyclovir (ZOVIRAX) 440 mg in dextrose 5 % 100 mL IVPB     10 mg/kg  43.9 kg 108.8 mL/hr over 60 Minutes Intravenous STAT 01/28/16 1550 01/28/16 1706   01/28/16 1300  ampicillin (OMNIPEN) 1 g in sodium chloride 0.9 % 50 mL IVPB     1 g 150 mL/hr over 20 Minutes Intravenous Every 6 hours 01/28/16 0659     01/28/16 0700  cefTRIAXone (ROCEPHIN) 2 g in dextrose 5 % 50 mL IVPB     2 g 100 mL/hr over 30 Minutes Intravenous  Once 01/28/16 0629 01/28/16 0806   01/28/16 0700  vancomycin (VANCOCIN) IVPB 1000 mg/200 mL premix     1,000 mg 200 mL/hr over 60 Minutes Intravenous  Once 01/28/16 0629 01/28/16 1025   01/28/16 0700  ampicillin (OMNIPEN) 2 g in sodium chloride 0.9 % 50 mL IVPB     2 g 150 mL/hr over 20 Minutes Intravenous  Once 01/28/16 0629 01/28/16 0730        Intake/Output Summary (Last 24 hours) at 01/30/16 1951 Last data filed at 01/30/16 1430  Gross per 24 hour  Intake    120 ml  Output      2 ml  Net    118 ml   Filed Weights   01/27/16 2015 01/29/16 0100 01/30/16 0620  Weight: 43.9 kg (96 lb 12.5 oz) 46 kg (101 lb 6.6 oz) 47 kg (103 lb 9.9 oz)    Objective: Physical Exam: Filed Vitals:   01/29/16 1426 01/29/16 2128 01/30/16 0620 01/30/16 1513  BP: 106/54 126/69 162/46 149/50  Pulse: 63 66 55 73  Temp: 98.9 F (37.2 C) 97.8 F (36.6 C) 97.7 F (36.5 C) 97.5 F (36.4 C)  TempSrc: Oral     Resp: 14 18 16 18   Height:      Weight:   47 kg (103 lb 9.9 oz)   SpO2: 96% 97% 96% 97%    General: Awake, Alert Orientation shows improvement over yesterday Eyes: PERRL, Conjunctiva normal ENT: Oral Mucosa clear moist. Neck: no JVD, no Abnormal Mass Or lumps Cardiovascular: S1 and S2 Present, aortic systolic Murmur, Peripheral Pulses Present Respiratory: Bilateral Air entry equal and Decreased, Clear to Auscultation, no Crackles, no wheezes Abdomen: Bowel Sound present, Soft and no tenderness Skin:  no redness, no Rash  Extremities: no Pedal edema, no calf tenderness Neurologic: Mental status awake, Following command, carrying out conversation, orientedTo self, Cranial Nerves PERRL, EOM normal and present, Motor strength 4/5 bilaterally Sensation present to light touch Reflexes difficult to assess knee and biceps, babinski negative,  Cerebellar test negative finger-nose-finger  Data Reviewed: CBC:  Recent Labs Lab 01/27/16 1446 01/28/16 0551 01/29/16 0700 01/30/16 0551  WBC 14.8* 11.0* 11.0* 9.2  NEUTROABS  --   --  7.1  --   HGB 13.0 13.3 12.8 12.1  HCT 39.4 40.6 38.5 35.8*  MCV 90.4 90.4 89.5 88.8  PLT 345 PLATELET CLUMPS NOTED ON SMEAR, COUNT APPEARS ADEQUATE 320 [id number]   Basic Metabolic Panel:  Recent Labs Lab 01/27/16 1446 01/28/16 0551 01/29/16 0700 01/29/16 1047 01/30/16 0551  NA 138 135 137  --  143  K 3.8 3.5 3.0*  --  3.5  CL 103 102 104  --  110  CO2 26 21* 24  --  27  GLUCOSE 101* 116* 122*  --  109*  BUN 12 13 11   --  7  CREATININE 0.68 0.74 0.69  --  0.55  CALCIUM 9.5 9.0 8.6*  --  8.8*  MG  --   --   --  1.8 2.0    Liver Function Tests:  Recent Labs Lab 01/27/16 1446 01/29/16 0700  AST 26 42*  ALT 16 21  ALKPHOS 86 72  BILITOT 0.6 0.6  PROT 6.6 6.0*  ALBUMIN 4.1 3.3*   No results for input(s): LIPASE, AMYLASE in the last 168 hours.  Recent Labs Lab 01/27/16 2117  AMMONIA 18   Coagulation Profile:  Recent Labs Lab 01/27/16 1446 01/27/16 2130 01/28/16 0713 01/29/16 0700 01/30/16 0551  INR 1.17 1.09 1.18 1.21 1.06   Cardiac Enzymes: No results for input(s): CKTOTAL, CKMB, CKMBINDEX, TROPONINI in the last 168 hours. BNP (last 3 results) No results for input(s): PROBNP in the last 8760 hours.  CBG: No results for input(s): GLUCAP in the last 168 hours.  Studies: Dg Fluoro Guided Needle Plc Aspiration/injection Loc  01/30/2016  CLINICAL DATA:  Meningitis. EXAM: DIAGNOSTIC LUMBAR PUNCTURE UNDER FLUOROSCOPIC GUIDANCE  FLUOROSCOPY TIME:  Radiation Exposure Index (as provided by the fluoroscopic device): Not available on this device. If the device does not provide the exposure index: Fluoroscopy Time (in minutes and seconds):  30 seconds Number of Acquired Images:  1 PROCEDURE: Informed consent was obtained from the patient prior to the procedure, including potential complications of headache, allergy, and pain. With the patient prone, the lower back was prepped with Betadine. 1% Lidocaine was used for local anesthesia. Lumbar puncture was performed at the L3-4 level using a 20 gauge needle with return of clear CSF. 11 cc of CSF were obtained for laboratory studies. The patient tolerated the procedure well and there were no apparent complications. IMPRESSION: Successful fluoroscopic guided lumbar puncture with 11 cc of clear CSF removed and sent to the laboratory. Electronically Signed   By: Franki Cabot M.D.   On: 01/30/2016 14:44   Dg Fluoro Guide Lumbar Puncture  01/30/2016  Greta Doom, MD     01/30/2016 12:19 PM Indication: meningitis Risks of the procedure were dicussed with the patient including post-LP headache, bleeding, infection, weakness/numbness of legs(radiculopathy), death.  The patient/patient's proxy agreed and written consent was obtained. The patient was prepped and draped, and using sterile technique a 20 gauge quinke spinal needle was inserted in the L3-4 space without return, the L5-S1 space was tried without return. Will arrange under fluoro. Roland Rack, MD [facility] [phone] If 7pm- 7am, please page neurology on call as listed in [place].     Scheduled Meds: . acyclovir  10 mg/kg Intravenous Q12H  . ampicillin (OMNIPEN) IV  1 g Intravenous Q6H  . antiseptic oral rinse  7 mL Mouth Rinse q12n4p  . antiseptic oral rinse  7 mL Mouth Rinse BID  . [START ON 01/31/2016] apixaban  2.5 mg Oral BID  . cefTRIAXone (ROCEPHIN)  IV  2 g Intravenous Q12H  . cyanocobalamin   1,000 mcg Subcutaneous Daily  . darifenacin  7.5 mg Oral Daily  . vancomycin  750 mg Intravenous Q24H   Continuous Infusions: . dextrose 5 % and 0.9% NaCl 50 mL/hr at 01/29/16 0339  . heparin     PRN Meds: acetaminophen **OR** acetaminophen, haloperidol lactate, hydrALAZINE, ondansetron **OR** ondansetron (ZOFRAN) IV, RESOURCE THICKENUP CLEAR  Time spent: 30 minutes  Author: Berle Mull, MD [facility] Pager: [phone] 01/30/2016  7:51 PM  If 7PM-7AM, please contact night-coverage at [email or web address], password [facility]

## 2016-01-30 NOTE — Progress Notes (Signed)
Subjective: Awake and more talkative today. States she still has stiff neck.   Exam: Filed Vitals:   01/29/16 2128 01/30/16 0620  BP: 126/69 162/46  Pulse: 66 55  Temp: 97.8 F (36.6 C) 97.7 F (36.5 C)  Resp: 18 16    Gen: In bed, NAD MS: awake,able to follow commands. Knows she is in hospital and moth is May but got the year wrong  CN: PERRLA, EOMI, face symmetric.PERRLA, TML Motor: MAEW Sensory: intact throughout  Pertinent Labs/Diagnostics:  aPTT yesterday [address]  Etta Quill PA-C [facility] [phone]  Continues to improve.   Impression: 82 year old lady with a history of dementia as well as upper lip anemia and atrial fibrillation on apixaban presenting with altered mental status with acute encephalopathic state.   Given her neck stiffness and AMS, I suspect that this does represent meningitis.plan to attempt LP today.   Recommendations: 1) Lumbar puncture.  2) will follow.    Roland Rack, MD [facility] [phone]  If 7pm- 7am, please page neurology on call as listed in [place]. 01/30/2016, 9:27 AM

## 2016-01-30 NOTE — Progress Notes (Signed)
LP performed at L3-4.  No complications.11 cc clear CSF removed and sent to lab.

## 2016-01-30 NOTE — Progress Notes (Signed)
Speech Language Pathology Treatment: Dysphagia  Patient Details Name: Linda Morse MRN: [id number] DOB: 17-Dec-1933 Today's Date: 01/30/2016 Time: 0910-0920 SLP Time Calculation (min) (ACUTE ONLY): 10 min  Assessment / Plan / Recommendation Clinical Impression  Pt demonstrates persistent immediate cough following thin liquids, subjective appearance of delayed swallow. Nectar thick liquids with independent cup sips tolerated without signs of aspiration. Mentation more appropriate today and pt able to tolerate upgraded trials of solids. Will upgrade solids to dys 3 mechanical soft, continue nectar thick liquids.    HPI HPI: Patient with past medical history of chronic A. fib recently cardioverted in 01/08/2016, on adequate is noncompliant, dyslipidemia, dementia per family, sleep apnea, COPD, not on C Pap, not on home oxygen, was admitted on 01/27/2016, with complaint of confusion with fall. Patient has history of gait ataxia as well as dementia at her baseline and follows up with neurology as an outpatient. Patient was found to have acute encephalopathy of unknown etiology. On 01/29/2016 patient shows significant improvement in mentation.      SLP Plan  Continue with current plan of care     Recommendations  Diet recommendations: Dysphagia 3 (mechanical soft);Nectar-thick liquid Liquids provided via: Cup Medication Administration: Whole meds with puree Supervision: Staff to assist with self feeding Compensations: Slow rate;Small sips/bites;Minimize environmental distractions Postural Changes and/or Swallow Maneuvers: Seated upright 90 degrees;Upright 30-60 min after meal             Oral Care Recommendations: Oral care BID Follow up Recommendations: Skilled Nursing facility Plan: Continue with current plan of care     Blairs [name], MA CCC-SLP [id number]  [name] 01/30/2016, 10:54 AM

## 2016-01-30 NOTE — NC FL2 (Signed)
[place] LEVEL OF CARE SCREENING TOOL     IDENTIFICATION  Patient Name: Linda Morse Birthdate: Jul 03, 1934 Sex: female Admission Date (Current Location): 01/27/2016  [facility] and [place] Number:  Herbalist and Address:  [facility] [place], [place] [address], [place], [place] [address]      Provider Number: [id number]  Attending Physician Name and Address:  Lavina Hamman, MD  Relative Name and Phone Number:       Current Level of Care: Hospital Recommended Level of Care: [place] Prior Approval Number:    Date Approved/Denied:   PASRR Number: [id number]  Discharge Plan: SNF    Current Diagnoses: Patient Active Problem List   Diagnosis Date Noted  . Cerebral ventriculomegaly due to brain atrophy 01/28/2016  . Leucocytosis 01/28/2016  . suspected Encephalitis 01/28/2016  . B12 deficiency 01/28/2016  . Encephalopathy 01/27/2016  . Dementia 01/27/2016  . Delirium 01/27/2016  . Acute encephalopathy 01/27/2016  . Persistent atrial fibrillation ([place])   . Atrial fibrillation, persistent ([place]) 12/08/2015  . Syncope 12/08/2015  . SOB (shortness of breath) 12/08/2015  . Fatigue 12/08/2015  . Memory loss 09/10/2015  . Abnormality of gait 09/10/2015  . TOBACCO ABUSE 09/06/2007  . OBSTRUCTIVE SLEEP APNEA 09/06/2007  . COPD (chronic obstructive pulmonary disease) ([place]) 09/06/2007    Orientation RESPIRATION BLADDER Height & Weight     Self  Normal Incontinent Weight: 103 lb 9.9 oz (47 kg) Height:  5\' 3"  (160 cm)  BEHAVIORAL SYMPTOMS/MOOD NEUROLOGICAL BOWEL NUTRITION STATUS      Continent Diet (see DC)  AMBULATORY STATUS COMMUNICATION OF NEEDS Skin   Extensive Assist Verbally                         Personal Care Assistance Level of Assistance  Bathing, Dressing Bathing Assistance: Maximum assistance   Dressing Assistance: Maximum assistance     Functional Limitations Info              SPECIAL CARE FACTORS FREQUENCY  PT (By licensed PT), OT (By licensed OT)     PT Frequency: 5/wk OT Frequency: 5/wk            Contractures      Additional Factors Info  Code Status, Allergies, Isolation Precautions Code Status Info: DNR Allergies Info: NKA     Isolation Precautions Info: Droplet     Current Medications (01/30/2016):  This is the current hospital active medication list Current Facility-Administered Medications  Medication Dose Route Frequency Provider Last Rate Last Dose  . acetaminophen (TYLENOL) tablet 650 mg  650 mg Oral Q6H PRN Thurnell Lose, MD       Or  . acetaminophen (TYLENOL) suppository 650 mg  650 mg Rectal Q6H PRN Thurnell Lose, MD   650 mg at 01/28/16 0515  . acyclovir (ZOVIRAX) 440 mg in dextrose 5 % 100 mL IVPB  10 mg/kg Intravenous Q12H Skeet Simmer, RPH   440 mg at 01/30/16 [id number]  . ampicillin (OMNIPEN) 1 g in sodium chloride 0.9 % 50 mL IVPB  1 g Intravenous Q6H Rebecka Apley, RPH   1 g at 01/30/16 1504  . antiseptic oral rinse (CPC / CETYLPYRIDINIUM CHLORIDE 0.05%) solution 7 mL  7 mL Mouth Rinse q12n4p Lavina Hamman, MD   7 mL at 01/30/16 1506  . antiseptic oral rinse (CPC / CETYLPYRIDINIUM CHLORIDE 0.05%) solution 7 mL  7 mL Mouth Rinse BID Pranav  Jerilynn Som, MD   7 mL at 01/30/16 0829  . [START ON 01/31/2016] apixaban (ELIQUIS) tablet 2.5 mg  2.5 mg Oral BID Lavina Hamman, MD      . cefTRIAXone (ROCEPHIN) 2 g in dextrose 5 % 50 mL IVPB  2 g Intravenous Q12H Rebecka Apley, RPH   2 g at 01/30/16 [id number]  . cyanocobalamin ((VITAMIN B-12)) injection 1,000 mcg  1,000 mcg Subcutaneous Daily Lavina Hamman, MD   1,000 mcg at 01/30/16 [id number]  . darifenacin (ENABLEX) 24 hr tablet 7.5 mg  7.5 mg Oral Daily Thurnell Lose, MD   7.5 mg at 01/30/16 0823  . dextrose 5 %-0.9 % sodium chloride infusion   Intravenous Continuous Rhetta Mura Schorr, NP 50 mL/hr at 01/29/16 0339    . haloperidol lactate (HALDOL) injection 2 mg  2 mg Intravenous Q6H PRN  Thurnell Lose, MD   2 mg at 01/30/16 1505  . heparin ADULT infusion 100 units/mL (25000 units/282mL sodium chloride 0.45%)  800 Units/hr Intravenous Continuous Lavina Hamman, MD      . hydrALAZINE (APRESOLINE) injection 10 mg  10 mg Intravenous Q6H PRN Thurnell Lose, MD      . ondansetron (ZOFRAN) tablet 4 mg  4 mg Oral Q6H PRN Thurnell Lose, MD       Or  . ondansetron (ZOFRAN) injection 4 mg  4 mg Intravenous Q6H PRN Thurnell Lose, MD      . RESOURCE THICKENUP CLEAR   Oral PRN Lavina Hamman, MD      . vancomycin (VANCOCIN) IVPB 750 mg/150 ml premix  750 mg Intravenous Q24H Rebecka Apley, RPH   750 mg at 01/30/16 [id number]     Discharge Medications: Please see discharge summary for a list of discharge medications.  Relevant Imaging Results:  Relevant Lab Results:   Additional Information SS#: [id number]  Cranford Mon, [place]

## 2016-01-30 NOTE — Progress Notes (Signed)
ANTICOAGULATION CONSULT NOTE - [place] for Apixaban >> Heparin Indication: atrial fibrillation  No Known Allergies  Patient Measurements: Height: 5\' 3"  (160 cm) Weight: 103 lb 9.9 oz (47 kg) IBW/kg (Calculated) : 52.4  Heparin Dosing Weight = 49.9 kg  Vital Signs: Temp: 97.7 F (36.5 C) (05/26 0620) BP: 162/46 mmHg (05/26 0620) Pulse Rate: 55 (05/26 0620)  Labs:  Recent Labs  01/28/16 0551  01/28/16 0713 01/28/16 1400 01/28/16 2249 01/29/16 0700 01/29/16 1518 01/30/16 0551 01/30/16 0946  HGB 13.3  --   --   --   --  12.8  --  12.1  --   HCT 40.6  --   --   --   --  38.5  --  35.8*  --   PLT PLATELET CLUMPS NOTED ON SMEAR, COUNT APPEARS ADEQUATE  --   --   --   --  320  --  328  --   APTT  --   < > 27 28 47* 74* 73*  --  27  LABPROT  --   --  15.1  --   --  15.5*  --  14.0  --   INR  --   --  1.18  --   --  1.21  --  1.06  --   HEPARINUNFRC  --   --   --  0.50 0.66 0.96*  --   --   --   CREATININE 0.74  --   --   --   --  0.69  --  0.55  --   < > = values in this interval not displayed.  Estimated Creatinine Clearance: 41.6 mL/min (by C-G formula based on Cr of 0.55).   Medical History: Past Medical History  Diagnosis Date  . Hypercholesterolemia   . Complication of anesthesia     takes 3- 4 days to wake up  . Arthritis   . Sleep apnea   . Memory loss   . Depression   . Atrial fibrillation, persistent ([place]) 12/08/2015  . Syncope 12/08/2015  . Fatigue 12/08/2015    Medications:  Prescriptions prior to admission  Medication Sig Dispense Refill Last Dose  . apixaban (ELIQUIS) 5 MG TABS tablet Take 1 tablet (5 mg total) by mouth 2 (two) times daily. 60 tablet 11 01/26/2016 at Unknown time  . memantine (NAMENDA) 10 MG tablet Take 1 tablet (10 mg total) by mouth 2 (two) times daily. 60 tablet 11 01/26/2016 at Unknown time  . solifenacin (VESICARE) 5 MG tablet Take 1 tablet (5 mg total) by mouth daily. 30 tablet 6 01/26/2016 at Unknown time   . alendronate (FOSAMAX) 70 MG tablet Take 70 mg by mouth once a week.    Unknown at Unknown time  . Ascorbic Acid (VITAMIN C) 1000 MG tablet Take 1,000 mg by mouth daily.   Unknown at Unknown time  . Cholecalciferol (D3-1000) 1000 UNITS capsule Take 1,000 Units by mouth daily.   Unknown at Unknown time  . Cyanocobalamin (VITAMIN B-12) 1000 MCG SUBL Place 1,000 mcg under the tongue daily.   Unknown at Unknown time  . FLUoxetine (PROZAC) 20 MG capsule Take 20 mg by mouth daily.    Unknown at Unknown time  . Multiple Vitamins-Minerals (ALIVE WOMENS GUMMY PO) Take 2 capsules by mouth daily.   Unknown at Unknown time  . pyridOXINE (VITAMIN B-6) 100 MG tablet Take 100 mg by mouth daily.   Unknown at Unknown time  Assessment: 61 YOF with a history of chronic atrial fibrillation recently cardioverted a few weeks ago on Eliquis but is non-compliant reportedly missing 1-2 doses per month. She presents with confusion and AMS and is being evaluated for r/o meningitis.  Pharmacy consulted to resume apixaban however no doses given.  Now to transition to heparin in the event procedures are needed.  She takes apixaban 5 mg twice daily which was prescribed by her cardiologist. Based on her weight and age, she qualifies for dose adjustment to 2.5mg  PO BID when/if resumed.  Last dose 5/22.    Apixaban can falsely elevate the anti-Xa level (heparin level) assay and until levels correlate with aptt, appt will need to be used to guide therapy dose adjustments.  Heparin has been held since this AM for the LP. S/p LP now. D/w Dr. Leonel Ramsay, we will restart heparin 6 hr post procedure today and resume apixaban tomorrow.   Goal of Therapy:  Heparin level 0.3-0.7 aPTT 66-102 Monitor platelets by anticoagulation protocol: Yes   Plan:   Restart heparin at 800 units/hr F/u with 8hr PTT HL and PTT in AM Resume apixaban tomorrow at 2.5mg  PO BID  Onnie Boer, PharmD Pager: [phone] 01/30/2016 3:00  PM

## 2016-01-31 LAB — BASIC METABOLIC PANEL
Anion gap: 9 (ref 5–15)
BUN: 8 mg/dL (ref 6–20)
CALCIUM: 8.9 mg/dL (ref 8.9–10.3)
CO2: 25 mmol/L (ref 22–32)
CREATININE: 0.55 mg/dL (ref 0.44–1.00)
Chloride: 104 mmol/L (ref 101–111)
GFR calc Af Amer: >60 mL/min (ref >60)
Glucose, Bld: 120 mg/dL — ABNORMAL HIGH (ref 65–99)
POTASSIUM: 3.4 mmol/L — AB (ref 3.5–5.1)
SODIUM: 138 mmol/L (ref 135–145)

## 2016-01-31 LAB — CBC
HEMATOCRIT: 36.7 % (ref 36.0–46.0)
Hemoglobin: 12.3 g/dL (ref 12.0–15.0)
MCH: 29.1 pg (ref 26.0–34.0)
MCHC: 33.5 g/dL (ref 30.0–36.0)
MCV: 87 fL (ref 78.0–100.0)
PLATELETS: 338 10*3/uL (ref 150–400)
RBC: 4.22 MIL/uL (ref 3.87–5.11)
RDW: 12.6 % (ref 11.5–15.5)
WBC: 10 10*3/uL (ref 4.0–10.5)

## 2016-01-31 LAB — HERPES SIMPLEX VIRUS(HSV) DNA BY PCR
HSV 1 DNA: NEGATIVE
HSV 2 DNA: NEGATIVE

## 2016-01-31 LAB — HEPARIN LEVEL (UNFRACTIONATED): HEPARIN UNFRACTIONATED: 0.5 [IU]/mL (ref 0.30–0.70)

## 2016-01-31 LAB — APTT
APTT: 55 s — AB (ref 24–37)
APTT: 28 s (ref 24–37)

## 2016-01-31 MED ORDER — VITAMIN B-12 1000 MCG PO TABS
1000.0000 ug | ORAL_TABLET | Freq: Every day | ORAL | Status: DC
  Administered 2016-02-01 – 2016-02-04 (×2): 1000 ug via ORAL
  Filled 2016-01-31 (×4): qty 1

## 2016-01-31 NOTE — Progress Notes (Signed)
[facility] Progress Note  Patient: Linda Morse [id number]   PCP: Tawanna Solo, MD DOB: 23-Jun-1934   DOA: 01/27/2016   DOS: 01/31/2016   Date of Service: the patient was seen and examined on 01/31/2016  Subjective: Patient denies any acute complaint. No acute events overnight. Denies any chest pain and abdominal pain. Mental status is about the same as yesterday. Pleasant and oriented Nutrition: Tolerating dysphagia 3 diet  Brief hospital course: Patient with past medical history of chronic A. fib recently cardioverted in 01/08/2016, on adequate is noncompliant, dyslipidemia, dementia per family, sleep apnea, COPD, not on C Pap, not on home oxygen, was admitted on 01/27/2016, with complaint of confusion with fall. Patient has history of gait ataxia as well as dementia at her baseline and follows up with neurology as an outpatient. Patient was found to have acute encephalopathy of unknown etiology. Since 01/29/2016 patient shows significant improvement in mentation.  Currently further plan is continue further workup for the encephalopathy.  Assessment and Plan: 1. Acute encephalopathy, improving Etiology still unclear, likely multifactorial MRI brain negative for stroke shows evidence of hydrocephalus although as per neurology no evidence of hydrocephalus and this appears to be ventriculomegaly secondary to cerebral atrophy. Appreciate neurosurgery input. EEG shows evidence of triphasic wave as well as focal right hemispheric dysfunction likely secondary to subdural hygroma. UA normal, lactic acid normal, TSH normal, ammonia normal Lumbar puncture on 01/30/2016. Results does not fit the picture for bacterial meningitis. Cultures and HSV PCR pending.  Patient did spike a fever on 01/28/2016 and with leukocytosis patient is given an empiric antibiotics for CNS infection. At present given the dramatic improvement after initiation of the antibiotics and initial admission  examination with prolonged encephalopathy patient clinically fits the picture for bacterial meningitis and therefore we will continue with the antibiotics to complete a 7 day treatment course.  dysphagia type III diet PTOT consult pending. When necessary Haldol. Avoid other psychotropic medications at present.  2. History of persistent A. fib. Recently cardioverted on 01/08/2016. For anticoagulation noncompliant. MRI negative for acute stroke. Due to history of recent cardioversion continue Apixaban  3. B-12 deficiency. Changing oral B-12 to subcutaneous in the hospital., Change to oral 01/31/2016  4. History of COPD, history of sleep apnea. ABG does not show any evidence of hypercarbia. No evidence of exacerbation at present. Monitor on telemetry.  5. Left elbow swelling.  Patient did not have any pain this morning. X-ray of the elbow was also negative for any acute fracture.  Continue to monitor.  Pain management: When necessary Tylenol Activity: Consulted physical therapy Bowel regimen: last BM 01/30/2016 Diet: Dysphagia type III diet DVT Prophylaxis: on therapeutic anticoagulation.  Advance goals of care discussion: DNR DNI  Family Communication: no family was present at bedside, at the time of interview.    Disposition:  Discharge to a SNF, pending completion of antibiotics Expected discharge date: 02/04/2016  Consultants: neurology  Procedures: EEG  Antibiotics: Anti-infectives    Start     Dose/Rate Route Frequency Ordered Stop   01/29/16 1000  vancomycin (VANCOCIN) IVPB 750 mg/150 ml premix     750 mg 150 mL/hr over 60 Minutes Intravenous Every 24 hours 01/28/16 0659     01/29/16 0400  acyclovir (ZOVIRAX) 440 mg in dextrose 5 % 100 mL IVPB     10 mg/kg  43.9 kg 108.8 mL/hr over 60 Minutes Intravenous Every 12 hours 01/28/16 1911     01/28/16 1900  cefTRIAXone (ROCEPHIN) 2 g in dextrose  5 % 50 mL IVPB     2 g 100 mL/hr over 30 Minutes Intravenous Every 12  hours 01/28/16 0659     01/28/16 1600  acyclovir (ZOVIRAX) 440 mg in dextrose 5 % 100 mL IVPB     10 mg/kg  43.9 kg 108.8 mL/hr over 60 Minutes Intravenous STAT 01/28/16 1550 01/28/16 1706   01/28/16 1300  ampicillin (OMNIPEN) 1 g in sodium chloride 0.9 % 50 mL IVPB     1 g 150 mL/hr over 20 Minutes Intravenous Every 6 hours 01/28/16 0659     01/28/16 0700  cefTRIAXone (ROCEPHIN) 2 g in dextrose 5 % 50 mL IVPB     2 g 100 mL/hr over 30 Minutes Intravenous  Once 01/28/16 0629 01/28/16 0806   01/28/16 0700  vancomycin (VANCOCIN) IVPB 1000 mg/200 mL premix     1,000 mg 200 mL/hr over 60 Minutes Intravenous  Once 01/28/16 0629 01/28/16 1025   01/28/16 0700  ampicillin (OMNIPEN) 2 g in sodium chloride 0.9 % 50 mL IVPB     2 g 150 mL/hr over 20 Minutes Intravenous  Once 01/28/16 0629 01/28/16 0730        Intake/Output Summary (Last 24 hours) at 01/31/16 1615 Last data filed at 01/31/16 0847  Gross per 24 hour  Intake 946.67 ml  Output      0 ml  Net 946.67 ml   Filed Weights   01/29/16 0100 01/30/16 0620 01/31/16 0525  Weight: 46 kg (101 lb 6.6 oz) 47 kg (103 lb 9.9 oz) 44.9 kg (98 lb 15.8 oz)    Objective: Physical Exam: Filed Vitals:   01/30/16 0620 01/30/16 1513 01/30/16 2239 01/31/16 0525  BP: 162/46 149/50 189/114 160/65  Pulse: 55 73 72 63  Temp: 97.7 F (36.5 C) 97.5 F (36.4 C) 98.1 F (36.7 C) 97.7 F (36.5 C)  TempSrc:   Oral Oral  Resp: 16 18 18 16   Height:      Weight: 47 kg (103 lb 9.9 oz)   44.9 kg (98 lb 15.8 oz)  SpO2: 96% 97% 98% 94%   General: Awake, Alert Orientation 3 Eyes: PERRL, Conjunctiva normal ENT: Oral Mucosa clear moist. Neck: no JVD, no Abnormal Mass Or lumps Cardiovascular: S1 and S2 Present, aortic systolic Murmur,  Respiratory: Bilateral Air entry equal and Decreased, Clear to Auscultation, no Crackles, no wheezes Abdomen: Bowel Sound present, Soft and no tenderness Skin: no redness, no Rash  Extremities: no Pedal edema, no calf  tenderness Neurologic: Mental status awake, Following command, carrying out conversation, oriented 3  Data Reviewed: CBC:  Recent Labs Lab 01/27/16 1446 01/28/16 0551 01/29/16 0700 01/30/16 0551 01/31/16 0221  WBC 14.8* 11.0* 11.0* 9.2 10.0  NEUTROABS  --   --  7.1  --   --   HGB 13.0 13.3 12.8 12.1 12.3  HCT 39.4 40.6 38.5 35.8* 36.7  MCV 90.4 90.4 89.5 88.8 87.0  PLT 345 PLATELET CLUMPS NOTED ON SMEAR, COUNT APPEARS ADEQUATE 320 328 [id number]   Basic Metabolic Panel:  Recent Labs Lab 01/27/16 1446 01/28/16 0551 01/29/16 0700 01/29/16 1047 01/30/16 0551 01/31/16 0221  NA 138 135 137  --  143 138  K 3.8 3.5 3.0*  --  3.5 3.4*  CL 103 102 104  --  110 104  CO2 26 21* 24  --  27 25  GLUCOSE 101* 116* 122*  --  109* 120*  BUN 12 13 11   --  7 8  CREATININE 0.68 0.74 0.69  --  0.55 0.55  CALCIUM 9.5 9.0 8.6*  --  8.8* 8.9  MG  --   --   --  1.8 2.0  --     Liver Function Tests:  Recent Labs Lab 01/27/16 1446 01/29/16 0700  AST 26 42*  ALT 16 21  ALKPHOS 86 72  BILITOT 0.6 0.6  PROT 6.6 6.0*  ALBUMIN 4.1 3.3*   No results for input(s): LIPASE, AMYLASE in the last 168 hours.  Recent Labs Lab 01/27/16 2117  AMMONIA 18   Coagulation Profile:  Recent Labs Lab 01/27/16 1446 01/27/16 2130 01/28/16 0713 01/29/16 0700 01/30/16 0551  INR 1.17 1.09 1.18 1.21 1.06   Cardiac Enzymes: No results for input(s): CKTOTAL, CKMB, CKMBINDEX, TROPONINI in the last 168 hours. BNP (last 3 results) No results for input(s): PROBNP in the last 8760 hours.  CBG: No results for input(s): GLUCAP in the last 168 hours.  Studies: No results found.   Scheduled Meds: . acyclovir  10 mg/kg Intravenous Q12H  . ampicillin (OMNIPEN) IV  1 g Intravenous Q6H  . antiseptic oral rinse  7 mL Mouth Rinse q12n4p  . antiseptic oral rinse  7 mL Mouth Rinse BID  . apixaban  2.5 mg Oral BID  . cefTRIAXone (ROCEPHIN)  IV  2 g Intravenous Q12H  . cyanocobalamin  1,000 mcg Subcutaneous  Daily  . darifenacin  7.5 mg Oral Daily  . vancomycin  750 mg Intravenous Q24H   Continuous Infusions: . dextrose 5 % and 0.9% NaCl 50 mL/hr at 01/30/16 2028   PRN Meds: acetaminophen **OR** acetaminophen, haloperidol lactate, hydrALAZINE, ondansetron **OR** ondansetron (ZOFRAN) IV, RESOURCE THICKENUP CLEAR  Time spent: 30 minutes  Author: Berle Mull, MD [facility] Pager: [phone] 01/31/2016 4:15 PM  If 7PM-7AM, please contact night-coverage at [email or web address], password [facility]

## 2016-01-31 NOTE — Progress Notes (Signed)
ANTICOAGULATION CONSULT NOTE - [place] for Apixaban >> Heparin Indication: atrial fibrillation  No Known Allergies  Patient Measurements: Height: 5\' 3"  (160 cm) Weight: 103 lb 9.9 oz (47 kg) IBW/kg (Calculated) : 52.4  Heparin Dosing Weight = 49.9 kg  Vital Signs: Temp: 98.1 F (36.7 C) (05/26 2239) Temp Source: Oral (05/26 2239) BP: 189/114 mmHg (05/26 2239) Pulse Rate: 72 (05/26 2239)  Labs:  Recent Labs  01/28/16 0713  01/28/16 2249 01/29/16 0700 01/29/16 1518 01/30/16 0551 01/30/16 0946 01/31/16 0221  HGB  --   --   --  12.8  --  12.1  --  12.3  HCT  --   --   --  38.5  --  35.8*  --  36.7  PLT  --   --   --  320  --  328  --  338  APTT 27  < > 47* 74* 73*  --  27 55*  LABPROT 15.1  --   --  15.5*  --  14.0  --   --   INR 1.18  --   --  1.21  --  1.06  --   --   HEPARINUNFRC  --   < > 0.66 0.96*  --   --   --  0.50  CREATININE  --   --   --  0.69  --  0.55  --  0.55  < > = values in this interval not displayed.  Estimated Creatinine Clearance: 41.6 mL/min (by C-G formula based on Cr of 0.55).   Medical History: Past Medical History  Diagnosis Date  . Hypercholesterolemia   . Complication of anesthesia     takes 3- 4 days to wake up  . Arthritis   . Sleep apnea   . Memory loss   . Depression   . Atrial fibrillation, persistent ([place]) 12/08/2015  . Syncope 12/08/2015  . Fatigue 12/08/2015    Medications:  Prescriptions prior to admission  Medication Sig Dispense Refill Last Dose  . apixaban (ELIQUIS) 5 MG TABS tablet Take 1 tablet (5 mg total) by mouth 2 (two) times daily. 60 tablet 11 01/26/2016 at Unknown time  . memantine (NAMENDA) 10 MG tablet Take 1 tablet (10 mg total) by mouth 2 (two) times daily. 60 tablet 11 01/26/2016 at Unknown time  . solifenacin (VESICARE) 5 MG tablet Take 1 tablet (5 mg total) by mouth daily. 30 tablet 6 01/26/2016 at Unknown time  . alendronate (FOSAMAX) 70 MG tablet Take 70 mg by mouth once a week.     Unknown at Unknown time  . Ascorbic Acid (VITAMIN C) 1000 MG tablet Take 1,000 mg by mouth daily.   Unknown at Unknown time  . Cholecalciferol (D3-1000) 1000 UNITS capsule Take 1,000 Units by mouth daily.   Unknown at Unknown time  . Cyanocobalamin (VITAMIN B-12) 1000 MCG SUBL Place 1,000 mcg under the tongue daily.   Unknown at Unknown time  . FLUoxetine (PROZAC) 20 MG capsule Take 20 mg by mouth daily.    Unknown at Unknown time  . Multiple Vitamins-Minerals (ALIVE WOMENS GUMMY PO) Take 2 capsules by mouth daily.   Unknown at Unknown time  . pyridOXINE (VITAMIN B-6) 100 MG tablet Take 100 mg by mouth daily.   Unknown at Unknown time    Assessment: 13 YOF with a history of chronic atrial fibrillation recently cardioverted a few weeks ago on Eliquis but is non-compliant reportedly missing 1-2 doses per month. She  presents with confusion and AMS and is being evaluated for r/o meningitis.  Pharmacy consulted to resume apixaban however no doses given.  Now to transition to heparin in the event procedures are needed.  She takes apixaban 5 mg twice daily which was prescribed by her cardiologist. Based on her weight and age, she qualifies for dose adjustment to 2.5mg  PO BID when/if resumed.  Last dose 5/22.    Apixaban can falsely elevate the anti-Xa level (heparin level) assay and until levels correlate with aptt, appt will need to be used to guide therapy dose adjustments.  Heparin has been held since this AM for the LP. S/p LP now. D/w Dr. Leonel Ramsay, we will restart heparin 6 hr post procedure today and resume apixaban tomorrow.   This morning's aPTT is subtherapeutic at 55 seconds on heparin 800 units/hr. Nurse reports no issues with infusion or bleeding.  Goal of Therapy:  Heparin level 0.3-0.7 aPTT 66-102 Monitor platelets by anticoagulation protocol: Yes   Plan:  Increase heparin to 950 units/hr F/u with 8hr aPTT HL and aPTT in AM Resume apixaban tomorrow at 2.5mg  PO BID  Andrey Cota.  Diona Foley, PharmD, [place] Clinical Pharmacist Pager [phone]  01/31/2016 3:06 AM

## 2016-01-31 NOTE — Progress Notes (Signed)
Subjective: Cotninues to improve.   Exam: Filed Vitals:   01/30/16 2239 01/31/16 0525  BP: 189/114 160/65  Pulse: 72 63  Temp: 98.1 F (36.7 C) 97.7 F (36.5 C)  Resp: 18 16    Gen: In bed, NAD MS: awake,able to follow commands. Able to identify people in the room. Keeps thinking that we are talking about a family member's health rather than hers.  CN: PERRLA, EOMI, face symmetric.PERRLA, TML Motor: MAEW Sensory: intact throughout  Impression: 82 year old lady with a history of mild dementia that presented with rapid onset AMS, fever, enck stiffness. She did not have an LP initially because of anticoagulation. This was performed yesterday after 3 days of antibiotics. CSF without pleocytosis but with neutophillic shift.   I would have expected continued inflammatory changes with meningitis, but her clinical presentation was so compelling and her improvement with antibiotics has been so dramatic that I would favor continuing to treat .  Her rapid change does raise the possibility of seizure at onset, but this does not account for the subsequent MS changes or exam. Her clincial improvement after institution of antibiotics fits with this as   Recommendations: 1) continue empiric antibiotics 2) can stop acyclovir if hsv pcr is negative.  3) Will follow  Roland Rack, MD [facility] [phone]  If 7pm- 7am, please page neurology on call as listed in [place]. 01/31/2016, 11:34 AM

## 2016-01-31 NOTE — Progress Notes (Signed)
Pharmacy Antibiotic Note  Linda Morse is a 82 y.o. female admitted on 01/27/2016 with possible meningitis.  Today is ay #4 of vancomycin, ceftriaxone, ampicillin and acyclovir. Pt had LP yesterday, CSF fluid is negative so far, HSV lab is still in process. SCr holding stable at 0.5.  Plan: -Continue ceftriaxone 2g IV q12h -Continue ampicillin 1 g IV q6h -Continue acyclovir 440 mg IV q12h -Continue vancomycin 750 mg IV q12h -Check vancomycin trough tomorrow due to age and low body weight -Monitor renal fx, cultures, duration of therapy, VT as needed  Height: 5\' 3"  (160 cm) Weight: 98 lb 15.8 oz (44.9 kg) IBW/kg (Calculated) : 52.4  Temp (24hrs), Avg:97.8 F (36.6 C), Min:97.5 F (36.4 C), Max:98.1 F (36.7 C)   Recent Labs Lab 01/27/16 1446 01/28/16 0551 01/28/16 0713 01/28/16 1002 01/29/16 0700 01/30/16 0551 01/31/16 0221  WBC 14.8* 11.0*  --   --  11.0* 9.2 10.0  CREATININE 0.68 0.74  --   --  0.69 0.55 0.55  LATICACIDVEN  --   --  1.0 1.3  --   --   --     Estimated Creatinine Clearance: 39.8 mL/min (by C-G formula based on Cr of 0.55).    No Known Allergies   Antimicrobials this admission:  Vancomycin 5/24>>  Ampicillin 5/24>>  Ceftriaxone 5/24>>  Acyclovir 5/24>>   Dose adjustments this admission:  none   Microbiology results:  5/24 blood x 2: ngtd  5/24 urine: ngtd  5/26 CSF: ngtd  5/26 HSV: ip    Hughes Better, PharmD, BCPS Clinical Pharmacist 01/31/2016 1:18 PM

## 2016-02-01 DIAGNOSIS — G039 Meningitis, unspecified: Secondary | ICD-10-CM

## 2016-02-01 LAB — VANCOMYCIN, TROUGH: VANCOMYCIN TR: 5 ug/mL — AB (ref 10.0–20.0)

## 2016-02-01 MED ORDER — VANCOMYCIN HCL IN DEXTROSE 750-5 MG/150ML-% IV SOLN
750.0000 mg | Freq: Two times a day (BID) | INTRAVENOUS | Status: AC
  Administered 2016-02-01 – 2016-02-03 (×5): 750 mg via INTRAVENOUS
  Filled 2016-02-01 (×6): qty 150

## 2016-02-01 MED ORDER — FLUOXETINE HCL 20 MG PO CAPS
20.0000 mg | ORAL_CAPSULE | Freq: Every day | ORAL | Status: DC
  Filled 2016-02-01 (×3): qty 1

## 2016-02-01 MED ORDER — MEMANTINE HCL 10 MG PO TABS
10.0000 mg | ORAL_TABLET | Freq: Two times a day (BID) | ORAL | Status: DC
  Administered 2016-02-01 – 2016-02-04 (×6): 10 mg via ORAL
  Filled 2016-02-01 (×6): qty 1

## 2016-02-01 NOTE — Progress Notes (Signed)
[facility] Progress Note  Patient: Linda Morse [id number]   PCP: Tawanna Solo, MD DOB: 02-Mar-1934   DOA: 01/27/2016   DOS: 02/01/2016   Date of Service: the patient was seen and examined on 02/01/2016  Subjective: Patient was confused this morning telling [place] Later on her mentation improved. As per the family the patient is very close to her baseline. Nutrition: Tolerating dysphagia 3 diet  Brief hospital course: Patient with past medical history of chronic A. fib recently cardioverted in 01/08/2016, on adequate is noncompliant, dyslipidemia, dementia per family, sleep apnea, COPD, not on C Pap, not on home oxygen, was admitted on 01/27/2016, with complaint of confusion with fall. Patient has history of gait ataxia as well as dementia at her baseline and follows up with neurology as an outpatient. Patient was found to have acute encephalopathy of unknown etiology. Since 01/29/2016 patient shows significant improvement in mentation.  Currently further plan is completed antibiotic course, and monitor for worsening encephalopathy and further workup  Assessment and Plan: 1. Acute encephalopathy, improving Etiology still unclear, likely multifactorial MRI brain negative for stroke shows evidence of hydrocephalus although as per neurology no evidence of hydrocephalus and this appears to be ventriculomegaly secondary to cerebral atrophy. Appreciate neurosurgery input. EEG shows evidence of triphasic wave as well as focal right hemispheric dysfunction likely secondary to subdural hygroma. UA normal, lactic acid normal, TSH normal, ammonia normal Lumbar puncture on 01/30/2016. Results does not fit the picture for bacterial meningitis. Cultures pending and HSV PCR negative.  Patient did spike a fever on 01/28/2016 and with leukocytosis patient is given an empiric antibiotics for CNS infection. At present given the dramatic improvement after initiation of  the antibiotics and initial admission examination with prolonged encephalopathy patient clinically fits the picture for bacterial meningitis and therefore we will continue with the antibiotics to complete a 7 day treatment course.  dysphagia type III diet PTOT consult pending. When necessary Haldol. Resuming home Namenda and Prozac  2. History of persistent A. fib. Recently cardioverted on 01/08/2016. For anticoagulation noncompliant. MRI negative for acute stroke. Due to history of recent cardioversion continue Apixaban  3. B-12 deficiency. Changing oral B-12 to subcutaneous in the hospital., Change to oral 01/31/2016  4. History of COPD, history of sleep apnea. ABG does not show any evidence of hypercarbia. No evidence of exacerbation at present. Monitor on telemetry.  5. Left elbow swelling.  Patient did not have any pain this morning. X-ray of the elbow was also negative for any acute fracture.  Continue to monitor.  Pain management: When necessary Tylenol Activity: Consulted physical therapy Bowel regimen: last BM 01/30/2016 Diet: Dysphagia type III diet DVT Prophylaxis: on therapeutic anticoagulation.  Advance goals of care discussion: DNR DNI  Family Communication: no family was present at bedside, at the time of interview.    Disposition:  Discharge to a SNF, pending completion of antibiotics Expected discharge date: 02/04/2016  Consultants: neurology  Procedures: EEG  Antibiotics: Anti-infectives    Start     Dose/Rate Route Frequency Ordered Stop   02/01/16 2200  vancomycin (VANCOCIN) IVPB 750 mg/150 ml premix     750 mg 150 mL/hr over 60 Minutes Intravenous Every 12 hours 02/01/16 1019     01/29/16 1000  vancomycin (VANCOCIN) IVPB 750 mg/150 ml premix  Status:  Discontinued     750 mg 150 mL/hr over 60 Minutes Intravenous Every 24 hours 01/28/16 0659 02/01/16 1019   01/29/16 0400  acyclovir (ZOVIRAX) 440 mg in dextrose 5 % 100 mL IVPB  Status:   Discontinued     10 mg/kg  43.9 kg 108.8 mL/hr over 60 Minutes Intravenous Every 12 hours 01/28/16 1911 02/01/16 1646   01/28/16 1900  cefTRIAXone (ROCEPHIN) 2 g in dextrose 5 % 50 mL IVPB     2 g 100 mL/hr over 30 Minutes Intravenous Every 12 hours 01/28/16 0659     01/28/16 1600  acyclovir (ZOVIRAX) 440 mg in dextrose 5 % 100 mL IVPB     10 mg/kg  43.9 kg 108.8 mL/hr over 60 Minutes Intravenous STAT 01/28/16 1550 01/28/16 1706   01/28/16 1300  ampicillin (OMNIPEN) 1 g in sodium chloride 0.9 % 50 mL IVPB     1 g 150 mL/hr over 20 Minutes Intravenous Every 6 hours 01/28/16 0659     01/28/16 0700  cefTRIAXone (ROCEPHIN) 2 g in dextrose 5 % 50 mL IVPB     2 g 100 mL/hr over 30 Minutes Intravenous  Once 01/28/16 0629 01/28/16 0806   01/28/16 0700  vancomycin (VANCOCIN) IVPB 1000 mg/200 mL premix     1,000 mg 200 mL/hr over 60 Minutes Intravenous  Once 01/28/16 0629 01/28/16 1025   01/28/16 0700  ampicillin (OMNIPEN) 2 g in sodium chloride 0.9 % 50 mL IVPB     2 g 150 mL/hr over 20 Minutes Intravenous  Once 01/28/16 0629 01/28/16 0730        Intake/Output Summary (Last 24 hours) at 02/01/16 1647 Last data filed at 02/01/16 0930  Gross per 24 hour  Intake    774 ml  Output      0 ml  Net    774 ml   Filed Weights   01/30/16 0620 01/31/16 0525 02/01/16 0500  Weight: 47 kg (103 lb 9.9 oz) 44.9 kg (98 lb 15.8 oz) 45 kg (99 lb 3.3 oz)    Objective: Physical Exam: Filed Vitals:   01/31/16 2216 02/01/16 0454 02/01/16 0500 02/01/16 1338  BP: 152/57 139/52  155/58  Pulse: 69 58  70  Temp: 98.5 F (36.9 C) 98.2 F (36.8 C)  98 F (36.7 C)  TempSrc:    Oral  Resp: 18 18  20   Height:      Weight:   45 kg (99 lb 3.3 oz)   SpO2: 96% 95%  92%   General: Awake, Alert Orientation 3 Eyes: PERRL, Conjunctiva normal ENT: Oral Mucosa clear moist. Neck: no JVD, no Abnormal Mass Or lumps Cardiovascular: S1 and S2 Present, aortic systolic Murmur,  Respiratory: Bilateral Air entry  equal and Decreased, Clear to Auscultation, no Crackles, no wheezes Abdomen: Bowel Sound present, Soft and no tenderness Skin: no redness, no Rash  Extremities: no Pedal edema, no calf tenderness Neurologic: Mental status awake, Following command, carrying out conversation, oriented 3  Data Reviewed: CBC:  Recent Labs Lab 01/27/16 1446 01/28/16 0551 01/29/16 0700 01/30/16 0551 01/31/16 0221  WBC 14.8* 11.0* 11.0* 9.2 10.0  NEUTROABS  --   --  7.1  --   --   HGB 13.0 13.3 12.8 12.1 12.3  HCT 39.4 40.6 38.5 35.8* 36.7  MCV 90.4 90.4 89.5 88.8 87.0  PLT 345 PLATELET CLUMPS NOTED ON SMEAR, COUNT APPEARS ADEQUATE 320 328 Q000111Q   Basic Metabolic Panel:  Recent Labs Lab 01/27/16 1446 01/28/16 0551 01/29/16 0700 01/29/16 1047 01/30/16 0551 01/31/16 0221  NA 138 135 137  --  143 138  K 3.8 3.5 3.0*  --  3.5  3.4*  CL 103 102 104  --  110 104  CO2 26 21* 24  --  27 25  GLUCOSE 101* 116* 122*  --  109* 120*  BUN 12 13 11   --  7 8  CREATININE 0.68 0.74 0.69  --  0.55 0.55  CALCIUM 9.5 9.0 8.6*  --  8.8* 8.9  MG  --   --   --  1.8 2.0  --     Liver Function Tests:  Recent Labs Lab 01/27/16 1446 01/29/16 0700  AST 26 42*  ALT 16 21  ALKPHOS 86 72  BILITOT 0.6 0.6  PROT 6.6 6.0*  ALBUMIN 4.1 3.3*   No results for input(s): LIPASE, AMYLASE in the last 168 hours.  Recent Labs Lab 01/27/16 2117  AMMONIA 18   Coagulation Profile:  Recent Labs Lab 01/27/16 1446 01/27/16 2130 01/28/16 0713 01/29/16 0700 01/30/16 0551  INR 1.17 1.09 1.18 1.21 1.06   Cardiac Enzymes: No results for input(s): CKTOTAL, CKMB, CKMBINDEX, TROPONINI in the last 168 hours. BNP (last 3 results) No results for input(s): PROBNP in the last 8760 hours.  CBG: No results for input(s): GLUCAP in the last 168 hours.  Studies: No results found.   Scheduled Meds: . ampicillin (OMNIPEN) IV  1 g Intravenous Q6H  . antiseptic oral rinse  7 mL Mouth Rinse BID  . apixaban  2.5 mg Oral BID    . cefTRIAXone (ROCEPHIN)  IV  2 g Intravenous Q12H  . darifenacin  7.5 mg Oral Daily  . FLUoxetine  20 mg Oral Daily  . memantine  10 mg Oral BID  . vancomycin  750 mg Intravenous Q12H  . vitamin B-12  1,000 mcg Oral Daily   Continuous Infusions:   PRN Meds: acetaminophen **OR** acetaminophen, haloperidol lactate, hydrALAZINE, ondansetron **OR** ondansetron (ZOFRAN) IV, RESOURCE THICKENUP CLEAR  Time spent: 30 minutes  Author: Berle Mull, MD [facility] Pager: [phone] 02/01/2016 4:47 PM  If 7PM-7AM, please contact night-coverage at [email or web address], password [email or web address]

## 2016-02-01 NOTE — Progress Notes (Signed)
Pharmacy Antibiotic Note  Linda KALMBACH is a 82 y.o. female admitted on 01/27/2016 with possible meningitis.  Today is ay #5 of vancomycin, ceftriaxone, ampicillin and acyclovir. Pt had LP yesterday, CSF fluid is negative so far, HSV lab is still in process. SCr holding stable at 0.55, WBC 10.0  5/28 VT 5 (drawn 1 hr early)  Plan: -Increase vancomycin to 750 mg IV q12h -Continue ceftriaxone 2g IV q12h -Continue ampicillin 1 g IV q6h -Continue acyclovir 440 mg IV q12h -Monitor renal fx, cultures, duration of therapy, VT as needed  Height: 5\' 3"  (160 cm) Weight: 99 lb 3.3 oz (45 kg) IBW/kg (Calculated) : 52.4  Temp (24hrs), Avg:98.4 F (36.9 C), Min:98.2 F (36.8 C), Max:98.5 F (36.9 C)   Recent Labs Lab 01/27/16 1446 01/28/16 0551 01/28/16 0713 01/28/16 1002 01/29/16 0700 01/30/16 0551 01/31/16 0221 02/01/16 0824  WBC 14.8* 11.0*  --   --  11.0* 9.2 10.0  --   CREATININE 0.68 0.74  --   --  0.69 0.55 0.55  --   LATICACIDVEN  --   --  1.0 1.3  --   --   --   --   VANCOTROUGH  --   --   --   --   --   --   --  5*    Estimated Creatinine Clearance: 39.8 mL/min (by C-G formula based on Cr of 0.55).    No Known Allergies   Antimicrobials this admission:  Vancomycin 5/24>>  Ampicillin 5/24>>  Ceftriaxone 5/24>>  Acyclovir 5/24>>   Dose adjustments this admission:  5/28 VT 5 (drawn 1 hr early)   Microbiology results:  5/24 blood x 2: ngtd  5/24 urine: ngtd  5/26 CSF: ngtd  5/26 HSV: ip   Darl Pikes, PharmD Clinical Pharmacist- Resident Pager: [phone]   02/01/2016 10:12 AM

## 2016-02-01 NOTE — Progress Notes (Signed)
Subjective: Family reports again that today is much better than yesterday, which is been reported daily since she was started on antibiotics.  Exam: Filed Vitals:   02/01/16 0454 02/01/16 1338  BP: 139/52 155/58  Pulse: 58 70  Temp: 98.2 F (36.8 C) 98 F (36.7 C)  Resp: 18 20    Gen: In bed, NAD MS: awake,Alert, interactive. When asked where she is she states that she is in the basement of a hospital(has open windows) CN: PERRLA, EOMI, face symmetric.PERRLA, TML Motor: MAEW Sensory: intact throughout  Impression: 82 year old lady with a history of mild dementia that presented with rapid onset AMS, fever, enck stiffness. She did not have an LP initially because of anticoagulation. This was performed after 3 days of antibiotics. CSF without pleocytosis but with neutophillic shift.   I would have expected continued inflammatory changes with meningitis, but her clinical presentation was so compelling and her improvement with antibiotics has been so dramatic that I would favor continuing to treat at this time.  Her rapid change does raise the possibility of seizure at onset, but this does not account for the subsequent MS changes or exam.   Recommendations: 1) continue empiric antibiotics, treat for 7 days 2) can stop acyclovir if hsv pcr is negative.  3) Will follow  Roland Rack, MD [facility] [phone]  If 7pm- 7am, please page neurology on call as listed in [place]. 02/01/2016, 3:27 PM

## 2016-02-02 LAB — CSF CULTURE: CULTURE: NO GROWTH

## 2016-02-02 LAB — BASIC METABOLIC PANEL
Anion gap: 9 (ref 5–15)
BUN: 10 mg/dL (ref 6–20)
CALCIUM: 9.2 mg/dL (ref 8.9–10.3)
CHLORIDE: 103 mmol/L (ref 101–111)
CO2: 29 mmol/L (ref 22–32)
Creatinine, Ser: 0.56 mg/dL (ref 0.44–1.00)
GFR calc Af Amer: >60 mL/min (ref >60)
Glucose, Bld: 108 mg/dL — ABNORMAL HIGH (ref 65–99)
Potassium: 3.2 mmol/L — ABNORMAL LOW (ref 3.5–5.1)
Sodium: 141 mmol/L (ref 135–145)

## 2016-02-02 LAB — CULTURE, BLOOD (ROUTINE X 2)
CULTURE: NO GROWTH
Culture: NO GROWTH

## 2016-02-02 LAB — CSF CULTURE W GRAM STAIN

## 2016-02-02 LAB — CBC
HEMATOCRIT: 37 % (ref 36.0–46.0)
HEMOGLOBIN: 12.3 g/dL (ref 12.0–15.0)
MCH: 29.3 pg (ref 26.0–34.0)
MCHC: 33.2 g/dL (ref 30.0–36.0)
MCV: 88.1 fL (ref 78.0–100.0)
Platelets: 376 10*3/uL (ref 150–400)
RBC: 4.2 MIL/uL (ref 3.87–5.11)
RDW: 12.8 % (ref 11.5–15.5)
WBC: 9.5 10*3/uL (ref 4.0–10.5)

## 2016-02-02 MED ORDER — POTASSIUM CHLORIDE CRYS ER 20 MEQ PO TBCR
40.0000 meq | EXTENDED_RELEASE_TABLET | Freq: Once | ORAL | Status: AC
  Administered 2016-02-02: 40 meq via ORAL
  Filled 2016-02-02: qty 2

## 2016-02-02 NOTE — Progress Notes (Signed)
[facility] Progress Note  Patient: Linda Morse [id number]   PCP: Tawanna Solo, MD DOB: 01-16-1934   DOA: 01/27/2016   DOS: 02/02/2016   Date of Service: the patient was seen and examined on 02/02/2016  Subjective: No agitation reported by staff. No acute events overnight. Patient herself denies any acute complaint. Nutrition: Tolerating dysphagia 3 diet  Brief hospital course: Patient with past medical history of chronic A. fib recently cardioverted in 01/08/2016, on adequate is noncompliant, dyslipidemia, dementia per family, sleep apnea, COPD, not on C Pap, not on home oxygen, was admitted on 01/27/2016, with complaint of confusion with fall. Patient has history of gait ataxia as well as dementia at her baseline and follows up with neurology as an outpatient. Patient was found to have acute encephalopathy of unknown etiology. Since 01/29/2016 patient shows significant improvement in mentation.  Currently further plan is completed antibiotic course, and monitor for worsening encephalopathy and further workup  Assessment and Plan: 1. Acute encephalopathy, improving Etiology still unclear, likely multifactorial MRI brain negative for stroke shows evidence of hydrocephalus although as per neurology no evidence of hydrocephalus and this appears to be ventriculomegaly secondary to cerebral atrophy. Appreciate neurosurgery input. EEG shows evidence of triphasic wave as well as focal right hemispheric dysfunction likely secondary to subdural hygroma. UA normal, lactic acid normal, TSH normal, ammonia normal Lumbar puncture on 01/30/2016. Results does not fit the picture for bacterial meningitis. Cultures negative and HSV PCR negative. Discontinue acyclovir  Patient did spike a fever on 01/28/2016 and with leukocytosis patient is given an empiric antibiotics for CNS infection. At present given the dramatic improvement after initiation of the antibiotics and initial admission  examination with prolonged encephalopathy patient clinically fits the picture for bacterial meningitis and therefore we will continue with the antibiotics to complete a 7 day treatment course.  dysphagia type III diet PTOT consult recommends SNF. When necessary Haldol. Resuming home Namenda and Prozac  2. History of persistent A. fib. Recently cardioverted on 01/08/2016. For anticoagulation noncompliant. MRI negative for acute stroke. Due to history of recent cardioversion continue Apixaban  3. B-12 deficiency. Changing oral B-12 to subcutaneous in the hospital., Change to oral 01/31/2016  4. History of COPD, history of sleep apnea. ABG does not show any evidence of hypercarbia. No evidence of exacerbation at present. Monitor on telemetry.  5. Left elbow swelling.  Patient did not have any pain this morning. X-ray of the elbow was also negative for any acute fracture.  Resolved  Pain management: When necessary Tylenol Activity: SNF per physical therapy Bowel regimen: last BM 02/01/2016 Diet: Dysphagia type III diet DVT Prophylaxis: on therapeutic anticoagulation.  Advance goals of care discussion: DNR DNI  Family Communication: no family was present at bedside, at the time of interview.    Disposition:  Discharge to a SNF, pending completion of antibiotics Expected discharge date: 02/04/2016  Consultants: neurology  Procedures: EEG  Antibiotics: Anti-infectives    Start     Dose/Rate Route Frequency Ordered Stop   02/01/16 2200  vancomycin (VANCOCIN) IVPB 750 mg/150 ml premix     750 mg 150 mL/hr over 60 Minutes Intravenous Every 12 hours 02/01/16 1019     01/29/16 1000  vancomycin (VANCOCIN) IVPB 750 mg/150 ml premix  Status:  Discontinued     750 mg 150 mL/hr over 60 Minutes Intravenous Every 24 hours 01/28/16 0659 02/01/16 1019   01/29/16 0400  acyclovir (ZOVIRAX) 440 mg in dextrose 5 % 100 mL IVPB  Status:  Discontinued     10 mg/kg  43.9 kg 108.8 mL/hr over  60 Minutes Intravenous Every 12 hours 01/28/16 1911 02/01/16 1646   01/28/16 1900  cefTRIAXone (ROCEPHIN) 2 g in dextrose 5 % 50 mL IVPB     2 g 100 mL/hr over 30 Minutes Intravenous Every 12 hours 01/28/16 0659     01/28/16 1600  acyclovir (ZOVIRAX) 440 mg in dextrose 5 % 100 mL IVPB     10 mg/kg  43.9 kg 108.8 mL/hr over 60 Minutes Intravenous STAT 01/28/16 1550 01/28/16 1706   01/28/16 1300  ampicillin (OMNIPEN) 1 g in sodium chloride 0.9 % 50 mL IVPB     1 g 150 mL/hr over 20 Minutes Intravenous Every 6 hours 01/28/16 0659     01/28/16 0700  cefTRIAXone (ROCEPHIN) 2 g in dextrose 5 % 50 mL IVPB     2 g 100 mL/hr over 30 Minutes Intravenous  Once 01/28/16 0629 01/28/16 0806   01/28/16 0700  vancomycin (VANCOCIN) IVPB 1000 mg/200 mL premix     1,000 mg 200 mL/hr over 60 Minutes Intravenous  Once 01/28/16 0629 01/28/16 1025   01/28/16 0700  ampicillin (OMNIPEN) 2 g in sodium chloride 0.9 % 50 mL IVPB     2 g 150 mL/hr over 20 Minutes Intravenous  Once 01/28/16 0629 01/28/16 0730        Intake/Output Summary (Last 24 hours) at 02/02/16 1825 Last data filed at 02/02/16 1539  Gross per 24 hour  Intake    740 ml  Output    350 ml  Net    390 ml   Filed Weights   01/30/16 0620 01/31/16 0525 02/01/16 0500  Weight: 47 kg (103 lb 9.9 oz) 44.9 kg (98 lb 15.8 oz) 45 kg (99 lb 3.3 oz)    Objective: Physical Exam: Filed Vitals:   02/01/16 1338 02/01/16 2125 02/02/16 0514 02/02/16 1538  BP: 155/58 152/55 165/84 162/62  Pulse: 70 59 76 63  Temp: 98 F (36.7 C) 98.6 F (37 C) 97.9 F (36.6 C) 98.3 F (36.8 C)  TempSrc: Oral Oral    Resp: 20 18 16 17   Height:      Weight:      SpO2: 92% 96% 95% 96%   General: Awake, Alert Orientation 3 Eyes: PERRL, Conjunctiva normal ENT: Oral Mucosa clear moist. Neck: no JVD, no Abnormal Mass Or lumps Cardiovascular: S1 and S2 Present, aortic systolic Murmur,  Respiratory: Bilateral Air entry equal and Decreased, Clear to Auscultation, no  Crackles, no wheezes Abdomen: Bowel Sound present, Soft and no tenderness Skin: no redness, no Rash  Extremities: no Pedal edema, no calf tenderness Neurologic: Mental status awake, Following command, carrying out conversation, oriented 3  Data Reviewed: CBC:  Recent Labs Lab 01/28/16 0551 01/29/16 0700 01/30/16 0551 01/31/16 0221 02/02/16 0443  WBC 11.0* 11.0* 9.2 10.0 9.5  NEUTROABS  --  7.1  --   --   --   HGB 13.3 12.8 12.1 12.3 12.3  HCT 40.6 38.5 35.8* 36.7 37.0  MCV 90.4 89.5 88.8 87.0 88.1  PLT PLATELET CLUMPS NOTED ON SMEAR, COUNT APPEARS ADEQUATE 320 328 338 Q000111Q   Basic Metabolic Panel:  Recent Labs Lab 01/28/16 0551 01/29/16 0700 01/29/16 1047 01/30/16 0551 01/31/16 0221 02/02/16 0443  NA 135 137  --  143 138 141  K 3.5 3.0*  --  3.5 3.4* 3.2*  CL 102 104  --  110 104 103  CO2 21* 24  --  27 25 29   GLUCOSE 116* 122*  --  109* 120* 108*  BUN 13 11  --  7 8 10   CREATININE 0.74 0.69  --  0.55 0.55 0.56  CALCIUM 9.0 8.6*  --  8.8* 8.9 9.2  MG  --   --  1.8 2.0  --   --     Liver Function Tests:  Recent Labs Lab 01/27/16 1446 01/29/16 0700  AST 26 42*  ALT 16 21  ALKPHOS 86 72  BILITOT 0.6 0.6  PROT 6.6 6.0*  ALBUMIN 4.1 3.3*   No results for input(s): LIPASE, AMYLASE in the last 168 hours.  Recent Labs Lab 01/27/16 2117  AMMONIA 18   Coagulation Profile:  Recent Labs Lab 01/27/16 1446 01/27/16 2130 01/28/16 0713 01/29/16 0700 01/30/16 0551  INR 1.17 1.09 1.18 1.21 1.06   Cardiac Enzymes: No results for input(s): CKTOTAL, CKMB, CKMBINDEX, TROPONINI in the last 168 hours. BNP (last 3 results) No results for input(s): PROBNP in the last 8760 hours.  CBG: No results for input(s): GLUCAP in the last 168 hours.  Studies: No results found.   Scheduled Meds: . ampicillin (OMNIPEN) IV  1 g Intravenous Q6H  . antiseptic oral rinse  7 mL Mouth Rinse BID  . apixaban  2.5 mg Oral BID  . cefTRIAXone (ROCEPHIN)  IV  2 g Intravenous  Q12H  . darifenacin  7.5 mg Oral Daily  . FLUoxetine  20 mg Oral Daily  . memantine  10 mg Oral BID  . vancomycin  750 mg Intravenous Q12H  . vitamin B-12  1,000 mcg Oral Daily   Continuous Infusions:   PRN Meds: acetaminophen **OR** acetaminophen, haloperidol lactate, hydrALAZINE, ondansetron **OR** ondansetron (ZOFRAN) IV, RESOURCE THICKENUP CLEAR  Time spent: 30 minutes  Author: Berle Mull, MD [facility] Pager: [phone] 02/02/2016 6:25 PM  If 7PM-7AM, please contact night-coverage at [email or web address], password [facility]

## 2016-02-02 NOTE — Progress Notes (Signed)
Interval History:                                                                                                                      Linda Morse is an 82 y.o. female patient with likely bacterial meningitis. Much improved.    Past Medical History: Past Medical History  Diagnosis Date  . Hypercholesterolemia   . Complication of anesthesia     takes 3- 4 days to wake up  . Arthritis   . Sleep apnea   . Memory loss   . Depression   . Atrial fibrillation, persistent ([place]) 12/08/2015  . Syncope 12/08/2015  . Fatigue 12/08/2015    Past Surgical History  Procedure Laterality Date  . Back surgery    . Knee surgery    . Eye surgery Bilateral     catarats  . Abdominal hysterectomy    . Orif wrist fracture Right 06/16/2013    Procedure: OPEN REDUCTION INTERNAL FIXATION (ORIF) RIGHT WRIST FRACTURE ;  Surgeon: Roseanne Kaufman, MD;  Location: [place];  Service: Orthopedics;  Laterality: Right;  . Neck surgery    . Tee without cardioversion [name] 01/08/2016    Procedure: TRANSESOPHAGEAL ECHOCARDIOGRAM (TEE);  Surgeon: Jerline Pain, MD;  Location: [place];  Service: Cardiovascular;  Laterality: [name];  . Cardioversion [name] 01/08/2016    Procedure: CARDIOVERSION;  Surgeon: Jerline Pain, MD;  Location[facility];  Service: Cardiovascular;  Laterality: [name];    Family History: Family History  Problem Relation Age of Onset  . Hypertension Mother   . Lung cancer Father   . Congestive Heart Failure Mother     Social History:   reports that she has been smoking.  She does not have any smokeless tobacco history on file. She reports that she does not drink alcohol or use illicit drugs.  Allergies:  No Known Allergies   Medications:                                                                                                                         Current facility-administered medications:  .  acetaminophen (TYLENOL) tablet 650 mg, 650 mg, Oral, Q6H PRN, 650 mg at 02/02/16 0805 **OR**  acetaminophen (TYLENOL) suppository 650 mg, 650 mg, Rectal, Q6H PRN, Thurnell Lose, MD, 650 mg at 01/28/16 0515 .  ampicillin (OMNIPEN) 1 g in sodium chloride 0.9 % 50 mL IVPB, 1 g, Intravenous, Q6H, Rebecka Apley, RPH, 1 g at 02/02/16 [id number] .  antiseptic oral rinse (CPC / CETYLPYRIDINIUM CHLORIDE 0.05%) solution 7 mL, 7 mL, Mouth Rinse, BID, Lavina Hamman, MD, 7 mL at 02/01/16 2134 .  apixaban (ELIQUIS) tablet 2.5 mg, 2.5 mg, Oral, BID, Lavina Hamman, MD, 2.5 mg at 02/02/16 0805 .  cefTRIAXone (ROCEPHIN) 2 g in dextrose 5 % 50 mL IVPB, 2 g, Intravenous, Q12H, Rebecka Apley, RPH, 2 g at 02/02/16 0806 .  darifenacin (ENABLEX) 24 hr tablet 7.5 mg, 7.5 mg, Oral, Daily, Thurnell Lose, MD, 7.5 mg at 02/01/16 0930 .  FLUoxetine (PROZAC) capsule 20 mg, 20 mg, Oral, Daily, Lavina Hamman, MD, 20 mg at 02/01/16 1854 .  haloperidol lactate (HALDOL) injection 2 mg, 2 mg, Intravenous, Q6H PRN, Thurnell Lose, MD, 2 mg at 01/30/16 1505 .  hydrALAZINE (APRESOLINE) injection 10 mg, 10 mg, Intravenous, Q6H PRN, Thurnell Lose, MD .  memantine [facility]) tablet 10 mg, 10 mg, Oral, BID, Lavina Hamman, MD, 10 mg at 02/01/16 2134 .  ondansetron (ZOFRAN) tablet 4 mg, 4 mg, Oral, Q6H PRN **OR** ondansetron (ZOFRAN) injection 4 mg, 4 mg, Intravenous, Q6H PRN, Thurnell Lose, MD .  RESOURCE THICKENUP CLEAR, , Oral, PRN, Lavina Hamman, MD .  vancomycin (VANCOCIN) IVPB 750 mg/150 ml premix, 750 mg, Intravenous, Q12H, Darl Pikes, RPH, 750 mg at 02/01/16 2150 .  vitamin B-12 (CYANOCOBALAMIN) tablet 1,000 mcg, 1,000 mcg, Oral, Daily, Lavina Hamman, MD, 1,000 mcg at 02/01/16 0930   Neurologic Examination:                                                                                                     Today's Vitals   02/01/16 2125 02/02/16 0514 02/02/16 0807 02/02/16 0905  BP: 152/55 165/84    Pulse: 59 76    Temp: 98.6 F (37 C) 97.9 F (36.6 C)    TempSrc: Oral     Resp: 18 16    Height:       Weight:      SpO2: 96% 95%    PainSc:   5  2     Evaluation of higher integrative functions including: Level of alertness: Alert,  Oriented to time, place and person Speech: fluent, no evidence of dysarthria or aphasia noted.  Test the following cranial nerves: 2-12 grossly intact Motor examination: Normal tone, bulk, full 5/5 motor strength in all 4 extremities Examination of sensation : Normal and symmetric sensation to pinprick in all 4 extremities and on face Examination of deep tendon reflexes: 2+, normal and symmetric in all extremities, normal plantars bilaterally    Lab Results: Basic Metabolic Panel:  Recent Labs Lab 01/28/16 0551 01/29/16 0700 01/29/16 1047 01/30/16 0551 01/31/16 0221 02/02/16 0443  NA 135 137  --  143 138 141  K 3.5 3.0*  --  3.5 3.4* 3.2*  CL 102 104  --  110 104 103  CO2 21* 24  --  27 25 29   GLUCOSE 116* 122*  --  109* 120* 108*  BUN 13 11  --  7 8 10  CREATININE 0.74 0.69  --  0.55 0.55 0.56  CALCIUM 9.0 8.6*  --  8.8* 8.9 9.2  MG  --   --  1.8 2.0  --   --     Liver Function Tests:  Recent Labs Lab 01/27/16 1446 01/29/16 0700  AST 26 42*  ALT 16 21  ALKPHOS 86 72  BILITOT 0.6 0.6  PROT 6.6 6.0*  ALBUMIN 4.1 3.3*   No results for input(s): LIPASE, AMYLASE in the last 168 hours.  Recent Labs Lab 01/27/16 2117  AMMONIA 18    CBC:  Recent Labs Lab 01/28/16 0551 01/29/16 0700 01/30/16 0551 01/31/16 0221 02/02/16 0443  WBC 11.0* 11.0* 9.2 10.0 9.5  NEUTROABS  --  7.1  --   --   --   HGB 13.3 12.8 12.1 12.3 12.3  HCT 40.6 38.5 35.8* 36.7 37.0  MCV 90.4 89.5 88.8 87.0 88.1  PLT PLATELET CLUMPS NOTED ON SMEAR, COUNT APPEARS ADEQUATE 320 328 338 376    Cardiac Enzymes: No results for input(s): CKTOTAL, CKMB, CKMBINDEX, TROPONINI in the last 168 hours.  Lipid Panel: No results for input(s): CHOL, TRIG, HDL, CHOLHDL, VLDL, LDLCALC in the last 168 hours.  CBG: No results for input(s): GLUCAP in the last 168  hours.  Microbiology: Results for orders placed or performed during the hospital encounter of 01/27/16  Culture, blood (x 2)     Status: None (Preliminary result)   Collection Time: 01/28/16  7:20 AM  Result Value Ref Range Status   Specimen Description BLOOD RIGHT ARM  Final   Special Requests BOTTLES DRAWN AEROBIC ONLY 10CC  Final   Culture NO GROWTH 4 DAYS  Final   Report Status PENDING  Incomplete  Culture, blood (x 2)     Status: None (Preliminary result)   Collection Time: 01/28/16  7:28 AM  Result Value Ref Range Status   Specimen Description BLOOD RIGHT HAND  Final   Special Requests IN PEDIATRIC BOTTLE 3CC  Final   Culture NO GROWTH 4 DAYS  Final   Report Status PENDING  Incomplete  CSF culture     Status: None (Preliminary result)   Collection Time: 01/30/16  2:51 PM  Result Value Ref Range Status   Specimen Description CSF  Final   Special Requests NONE  Final   Gram Stain   Final    CYTOSPIN SMEAR WBC PRESENT, PREDOMINANTLY PMN NO ORGANISMS SEEN    Culture NO GROWTH 2 DAYS  Final   Report Status PENDING  Incomplete    Imaging: Dg Fluoro Guided Needle Plc Aspiration/injection Loc  01/30/2016  CLINICAL DATA:  Meningitis. EXAM: DIAGNOSTIC LUMBAR PUNCTURE UNDER FLUOROSCOPIC GUIDANCE FLUOROSCOPY TIME:  Radiation Exposure Index (as provided by the fluoroscopic device): Not available on this device. If the device does not provide the exposure index: Fluoroscopy Time (in minutes and seconds):  30 seconds Number of Acquired Images:  1 PROCEDURE: Informed consent was obtained from the patient prior to the procedure, including potential complications of headache, allergy, and pain. With the patient prone, the lower back was prepped with Betadine. 1% Lidocaine was used for local anesthesia. Lumbar puncture was performed at the L3-4 level using a 20 gauge needle with return of clear CSF. 11 cc of CSF were obtained for laboratory studies. The patient tolerated the procedure well and  there were no apparent complications. IMPRESSION: Successful fluoroscopic guided lumbar puncture with 11 cc of clear CSF removed and sent to the laboratory. Electronically Signed   By:  Franki Cabot M.D.   On: 01/30/2016 14:44   Dg Fluoro Guide Lumbar Puncture  01/30/2016  Greta Doom, MD     01/30/2016 12:19 PM Indication: meningitis Risks of the procedure were dicussed with the patient including post-LP headache, bleeding, infection, weakness/numbness of legs(radiculopathy), death.  The patient/patient's proxy agreed and written consent was obtained. The patient was prepped and draped, and using sterile technique a 20 gauge quinke spinal needle was inserted in the L3-4 space without return, the L5-S1 space was tried without return. Will arrange under fluoro. Roland Rack, MD [facility] [phone] If 7pm- 7am, please page neurology on call as listed in [place].    Assessment and plan:   Linda Morse is an 82 y.o. female patient with bacterial meningitis currently on ABx. Will need to finish 7 day course ABx. At this time no further recommendations per neurology   Will be seen by Dr. Silverio Decamp. Please see his attestation note for A/P for any additional work up recommendations.

## 2016-02-02 NOTE — Progress Notes (Signed)
Speech Language Pathology Treatment: Dysphagia  Patient Details Name: YITTA ZENNER MRN: [id number] DOB: [birth year] Today's Date: 02/02/2016 Time: 0850-0906 SLP Time Calculation (min) (ACUTE ONLY): 16 min  Assessment / Plan / Recommendation Clinical Impression  Mentation improved from last week. Pt sitting upright, completing am meal without difficulty, tolerated dys 3 solids well without oral residuals. Observed with thin liquids with still suspected delayed swallow, but without any signs of aspiration in contrast with function last week during which dely seemed to consistently lead to sensed penetration/aspiration events. Pt self fed 8 oz of water via straw and cup sips of coffee. Recommend pt upgrade to dys 3/thin liquids. Will f/u for tolerance.    HPI HPI: Patient with past medical history of chronic A. fib recently cardioverted in 01/08/2016, on adequate is noncompliant, dyslipidemia, dementia per family, sleep apnea, COPD, not on C Pap, not on home oxygen, was admitted on 01/27/2016, with complaint of confusion with fall. Patient has history of gait ataxia as well as dementia at her baseline and follows up with neurology as an outpatient. Patient was found to have acute encephalopathy of unknown etiology. On 01/29/2016 patient shows significant improvement in mentation.      SLP Plan  Continue with current plan of care     Recommendations  Diet recommendations: Dysphagia 3 (mechanical soft);Thin liquid Liquids provided via: Cup;Straw Medication Administration: Whole meds with puree Supervision: Intermittent supervision to cue for compensatory strategies Compensations: Slow rate;Small sips/bites;Minimize environmental distractions Postural Changes and/or Swallow Maneuvers: Seated upright 90 degrees;Upright 30-60 min after meal             Oral Care Recommendations: Oral care BID Follow up Recommendations: Skilled Nursing facility Plan: Continue with current plan of care      GO                Ericca Labra, Katherene Ponto 02/02/2016, 10:46 AM

## 2016-02-02 NOTE — Clinical Social Work Note (Signed)
Clinical Social Work Assessment  Patient Details  Name: Linda Morse MRN: [id number] Date of Birth: 02/06/1934  Date of referral:  01/30/16               Reason for consult:  Facility Placement                Permission sought to share information with:  Family Supports Permission granted to share information::  No (pt DO at time of assessment/dementia)  Name::     [place]::  [facility] SNFs  Relationship::  dtr  Contact Information:     Housing/Transportation Living arrangements for the past 2 months:  Single Family Home Source of Information:  Adult Children Patient Interpreter Needed:  None Criminal Activity/Legal Involvement Pertinent to Current Situation/Hospitalization:  No - Comment as needed Significant Relationships:  Adult Children Lives with:  Adult Children Do you feel safe going back to the place where you live?  No Need for family participation in patient care:  Yes (Comment) (decision making)  Care giving concerns: pt lives at home with daughter- currently requiring high level of assist after surgery- dtr does not feel comfortable taking patient home   Social Worker assessment / plan:  CSW spoke with pt dtrs at bedside concerning PT recommendation for SNF.  Explained what is entailed in SNF stay and referral process for placement.  Dtr, Jeanne Ivan, reports pt normally lives with her but she does not think she can care for pt in current state- reports pt is normally independent in ADLs.  Employment status:  Retired Forensic scientist:  Medicare PT Recommendations:  [place] / Referral to community resources:  [place]  Patient/Family's Response to care:  Dtr recognizes pt need for therapy prior to return home and is agreeable to SNF.  Patient/Family's Understanding of and Emotional Response to Diagnosis, Current Treatment, and Prognosis:  Dtrs are very realistic about patient needs and just want  what is best for the pt- hopeful pt will recovery quickly and be able to return home.  Emotional Assessment Appearance:  Appears stated age Attitude/Demeanor/Rapport:    Affect (typically observed):  Restless Orientation:  Fluctuating Orientation (Suspected and/or reported Sundowners), Oriented to Self, Oriented to Place Alcohol / Substance use:  Not Applicable Psych involvement (Current and /or in the community):  No (Comment)  Discharge Needs  Concerns to be addressed:  Care Coordination Readmission within the last 30 days:  No Current discharge risk:  Physical Impairment Barriers to Discharge:  Continued Medical Work up   [employer], LCSW 02/02/2016, 8:07 AM

## 2016-02-02 NOTE — Progress Notes (Signed)
Physical Therapy Treatment Patient Details Name: Linda MASSAQUOI MRN: [id number] DOB: [birth year] Today's Date: 02/02/2016    History of Present Illness Pt adm with acute encephalopathy, lumbar puncture pending, meningitis suspected. Marland Kitchen PMH - dementia, COPD, afib.     PT Comments    Patient is making gradual progress toward PT goals and appeared less confused although still demonstrated cognitive deficits. Max/Total A for transfers. Continue to progress as tolerated with anticipated d/c to SNF for further skilled PT services.    Follow Up Recommendations  SNF     Equipment Recommendations  Other (comment)    Recommendations for Other Services       Precautions / Restrictions Precautions Precautions: Fall    Mobility  Bed Mobility Overal bed mobility: Needs Assistance Bed Mobility: Supine to Sit     Supine to sit: Mod assist;HOB elevated     General bed mobility comments: assist to elevate trunk into sittin with cues for sequencing and technique; tactile cues for hand placement upon sitting for balance  Transfers Overall transfer level: Needs assistance Equipment used: Rolling walker (2 wheeled);1 person hand held assist Transfers: Sit to/from [facility] Sit to Stand: Max assist;+2 physical assistance Stand pivot transfers: Total assist       General transfer comment: assist to power up into standing and to maintain balance upon standing due to heavy posterior lean; with attempt to lean anteriorly pt took step forward due to bilat PF tone; total A for stand pivot with knees blocked and holding onto therapist's arms  Ambulation/Gait                 Stairs            Wheelchair Mobility    Modified Rankin (Stroke Patients Only)       Balance Overall balance assessment: Needs assistance Sitting-balance support: Bilateral upper extremity supported;Feet supported Sitting balance-Leahy Scale: Poor Sitting balance - Comments:  posterior lean; able to correct with frequent cues   Standing balance support: Bilateral upper extremity supported Standing balance-Leahy Scale: Zero                      Cognition Arousal/Alertness: Awake/alert Behavior During Therapy: WFL for tasks assessed/performed Overall Cognitive Status: No family/caregiver present to determine baseline cognitive functioning Area of Impairment: Memory;Orientation Orientation Level: Situation   Memory: Decreased short-term memory Following Commands: Follows one step commands with increased time       General Comments: pt appeared confused and could not remember if her husband drove her to the hospital or if he had died before she came to the hospital but also stated that she is still confused    Exercises      General Comments        Pertinent Vitals/Pain Pain Assessment: No/denies pain    Home Living                      Prior Function            PT Goals (current goals can now be found in the care plan section) Acute Rehab PT Goals Patient Stated Goal: none stated Progress towards PT goals: Progressing toward goals    Frequency  Min 3X/week    PT Plan Current plan remains appropriate    Co-evaluation             End of Session Equipment Utilized During Treatment: Gait belt Activity Tolerance: Patient tolerated treatment well Patient left:  in chair;with call bell/phone within reach;with chair alarm set     Time: [id number] PT Time Calculation (min) (ACUTE ONLY): 15 min  Charges:  $Therapeutic Activity: 8-22 mins                    G Codes:      Salina April, PTA Pager: [phone]   02/02/2016, 5:12 PM

## 2016-02-02 NOTE — Progress Notes (Signed)
CSW spoke with dtr Paulette and provided bed offers- family chooses [facility] if pt is needing rehab when ready for DC.  Dtr states they had promised pt they would never place in a SNF- dtr is not wanting to commit to sending pt to SNF until clear what her level of functioning will be when closer to DC- per MD plan for DC Wednesday  CSW will continue to follow and assist with placement if family decides that is what is needed.  Domenica Reamer, [place] Social Worker [phone]

## 2016-02-03 NOTE — Progress Notes (Signed)
[facility] Progress Note  Patient: Linda Morse [id number]   PCP: Tawanna Solo, MD DOB: 29-Mar-1934   DOA: 01/27/2016   DOS: 02/03/2016   Date of Service: the patient was seen and examined on 02/03/2016  Subjective: No agitation reported by staff. No acute events overnight. Patient herself denies any acute complaint. Nutrition: Tolerating dysphagia 3 diet  Brief hospital course: Patient with past medical history of chronic A. fib recently cardioverted in 01/08/2016, on adequate is noncompliant, dyslipidemia, dementia per family, sleep apnea, COPD, not on C Pap, not on home oxygen, was admitted on 01/27/2016, with complaint of confusion with fall. Patient has history of gait ataxia as well as dementia at her baseline and follows up with neurology as an outpatient. Patient was found to have acute encephalopathy of unknown etiology. Since 01/29/2016 patient shows significant improvement in mentation.  Currently further plan is completed antibiotic course, and monitor for worsening encephalopathy and further workup  Assessment and Plan: 1. Acute encephalopathy, Resolving Etiology likely multifactorial MRI brain negative for stroke. As per neurosurgery no evidence of hydrocephalus and this appears to be ventriculomegaly secondary to cerebral atrophy. EEG shows evidence of triphasic wave as well as focal right hemispheric dysfunction likely secondary to subdural hygroma. UA normal, lactic acid normal, TSH normal, ammonia normal Lumbar puncture on 01/30/2016. Results does not fit the picture for bacterial meningitis. Cultures negative and HSV PCR negative.  Patient did spike a fever on 01/28/2016 and with leukocytosis patient is given an empiric antibiotics for CNS infection. After receiving the antibiotics the patient had dramatic improvement in mental status. As per my clinical examination as well as recommendations from neurology, patient clinically fits the picture for  bacterial meningitis and therefore we will continue with the antibiotics to complete a 7 day treatment course.  Antibiotic last day 02/03/2016.   Dysphagia type III diet per speech therapy PTOT consult recommends SNF, although the family wants to take the patient home with home health. Case management consulted.  When necessary Haldol. Resuming home Namenda and Prozac  2. History of persistent A. fib. Recently cardioverted on 01/08/2016. For anticoagulation noncompliant. MRI negative for acute stroke. Due to history of recent cardioversion continue Apixaban. Follow-up with cardiology as an outpatient  3. B-12 deficiency. Vitamin B-12 282 low-normal, continue B-12 supplementation  4. History of COPD, history of sleep apnea. ABG does not show any evidence of hypercarbia. No evidence of exacerbation at present. Monitor on telemetry.  5. Left elbow swelling.  Patient did not have any pain this morning. X-ray of the elbow was also negative for any acute fracture.  Resolved  Pain management: When necessary Tylenol Activity: SNF per physical therapy-family wants to take the patient home with home health. Case management consulted. Bowel regimen: last BM 02/03/2016 Diet: Dysphagia type III diet DVT Prophylaxis: on therapeutic anticoagulation.  Advance goals of care discussion: DNR DNI  Family Communication: no family was present at bedside, at the time of interview.    Disposition:  Discharge to a home with home health, pending completion of antibiotics Expected discharge date: 02/04/2016  Consultants: neurology  Procedures: EEG  Antibiotics: Anti-infectives    Start     Dose/Rate Route Frequency Ordered Stop   02/01/16 2200  vancomycin (VANCOCIN) IVPB 750 mg/150 ml premix     750 mg 150 mL/hr over 60 Minutes Intravenous Every 12 hours 02/01/16 1019 02/03/16 2359   01/29/16 1000  vancomycin (VANCOCIN) IVPB 750 mg/150 ml premix  Status:  Discontinued  750 mg 150 mL/hr  over 60 Minutes Intravenous Every 24 hours 01/28/16 0659 02/01/16 1019   01/29/16 0400  acyclovir (ZOVIRAX) 440 mg in dextrose 5 % 100 mL IVPB  Status:  Discontinued     10 mg/kg  43.9 kg 108.8 mL/hr over 60 Minutes Intravenous Every 12 hours 01/28/16 1911 02/01/16 1646   01/28/16 1900  cefTRIAXone (ROCEPHIN) 2 g in dextrose 5 % 50 mL IVPB     2 g 100 mL/hr over 30 Minutes Intravenous Every 12 hours 01/28/16 0659 02/03/16 2359   01/28/16 1600  acyclovir (ZOVIRAX) 440 mg in dextrose 5 % 100 mL IVPB     10 mg/kg  43.9 kg 108.8 mL/hr over 60 Minutes Intravenous STAT 01/28/16 1550 01/28/16 1706   01/28/16 1300  ampicillin (OMNIPEN) 1 g in sodium chloride 0.9 % 50 mL IVPB     1 g 150 mL/hr over 20 Minutes Intravenous Every 6 hours 01/28/16 0659 02/03/16 2359   01/28/16 0700  cefTRIAXone (ROCEPHIN) 2 g in dextrose 5 % 50 mL IVPB     2 g 100 mL/hr over 30 Minutes Intravenous  Once 01/28/16 0629 01/28/16 0806   01/28/16 0700  vancomycin (VANCOCIN) IVPB 1000 mg/200 mL premix     1,000 mg 200 mL/hr over 60 Minutes Intravenous  Once 01/28/16 0629 01/28/16 1025   01/28/16 0700  ampicillin (OMNIPEN) 2 g in sodium chloride 0.9 % 50 mL IVPB     2 g 150 mL/hr over 20 Minutes Intravenous  Once 01/28/16 0629 01/28/16 0730      Intake/Output Summary (Last 24 hours) at 02/03/16 1621 Last data filed at 02/03/16 1230  Gross per 24 hour  Intake    220 ml  Output      1 ml  Net    219 ml   Filed Weights   01/31/16 0525 02/01/16 0500 02/03/16 0202  Weight: 44.9 kg (98 lb 15.8 oz) 45 kg (99 lb 3.3 oz) 46.1 kg (101 lb 10.1 oz)   Objective: Physical Exam: Filed Vitals:   02/02/16 2335 02/03/16 0202 02/03/16 0656 02/03/16 1410  BP: 160/56  172/75 169/70  Pulse: 62  68 77  Temp: 98.2 F (36.8 C)  97.8 F (36.6 C) 97.9 F (36.6 C)  TempSrc: Oral  Oral Oral  Resp: 16  16 16   Height:      Weight:  46.1 kg (101 lb 10.1 oz)    SpO2: 97%  98% 96%   General: Awake, Alert Orientation 3 Eyes: PERRL,  Conjunctiva normal ENT: Oral Mucosa clear moist. Neck: no JVD, no Abnormal Mass Or lumps Cardiovascular: S1 and S2 Present, aortic systolic Murmur,  Respiratory: Bilateral Air entry equal and Decreased, Clear to Auscultation, no Crackles, no wheezes Abdomen: Bowel Sound present, Soft and no tenderness Skin: no redness, no Rash  Extremities: no Pedal edema, no calf tenderness Neurologic: Mental status awake, Following command, carrying out conversation, oriented 3  Data Reviewed: CBC:  Recent Labs Lab 01/28/16 0551 01/29/16 0700 01/30/16 0551 01/31/16 0221 02/02/16 0443  WBC 11.0* 11.0* 9.2 10.0 9.5  NEUTROABS  --  7.1  --   --   --   HGB 13.3 12.8 12.1 12.3 12.3  HCT 40.6 38.5 35.8* 36.7 37.0  MCV 90.4 89.5 88.8 87.0 88.1  PLT PLATELET CLUMPS NOTED ON SMEAR, COUNT APPEARS ADEQUATE 320 328 338 [id number]   Basic Metabolic Panel:  Recent Labs Lab 01/28/16 0551 01/29/16 0700 01/29/16 1047 01/30/16 0551 01/31/16 0221 02/02/16 0443  NA  135 137  --  143 138 141  K 3.5 3.0*  --  3.5 3.4* 3.2*  CL 102 104  --  110 104 103  CO2 21* 24  --  27 25 29   GLUCOSE 116* 122*  --  109* 120* 108*  BUN 13 11  --  7 8 10   CREATININE 0.74 0.69  --  0.55 0.55 0.56  CALCIUM 9.0 8.6*  --  8.8* 8.9 9.2  MG  --   --  1.8 2.0  --   --     Liver Function Tests:  Recent Labs Lab 01/29/16 0700  AST 42*  ALT 21  ALKPHOS 72  BILITOT 0.6  PROT 6.0*  ALBUMIN 3.3*   No results for input(s): LIPASE, AMYLASE in the last 168 hours.  Recent Labs Lab 01/27/16 2117  AMMONIA 18   Coagulation Profile:  Recent Labs Lab 01/27/16 2130 01/28/16 0713 01/29/16 0700 01/30/16 0551  INR 1.09 1.18 1.21 1.06   Cardiac Enzymes: No results for input(s): CKTOTAL, CKMB, CKMBINDEX, TROPONINI in the last 168 hours. BNP (last 3 results) No results for input(s): PROBNP in the last 8760 hours.  CBG: No results for input(s): GLUCAP in the last 168 hours.  Studies: No results found.   Scheduled  Meds: . ampicillin (OMNIPEN) IV  1 g Intravenous Q6H  . antiseptic oral rinse  7 mL Mouth Rinse BID  . apixaban  2.5 mg Oral BID  . cefTRIAXone (ROCEPHIN)  IV  2 g Intravenous Q12H  . darifenacin  7.5 mg Oral Daily  . memantine  10 mg Oral BID  . vancomycin  750 mg Intravenous Q12H  . vitamin B-12  1,000 mcg Oral Daily   Continuous Infusions:   PRN Meds: acetaminophen **OR** acetaminophen, haloperidol lactate, hydrALAZINE, ondansetron **OR** ondansetron (ZOFRAN) IV, RESOURCE THICKENUP CLEAR  Time spent: 30 minutes  Author: Berle Mull, MD [facility] Pager: [phone] 02/03/2016 4:21 PM  If 7PM-7AM, please contact night-coverage at [email or web address], password [facility]

## 2016-02-03 NOTE — Progress Notes (Addendum)
Daughter at the bedside this morning. Did not want her mother to have prozac and vitamin B12. Prozac has been documented by all nurses as not given. This was communicated to the daughter. Also, spoke with pharmacy and MD about refusal of medication/concerns. Daughter educated about contact precautions/reasons and refuses to wear mask. Will continue to monitor. Call bell within reach.

## 2016-02-03 NOTE — Progress Notes (Signed)
Speech Language Pathology Treatment: Dysphagia  Patient Details Name: Linda Morse MRN: [id number] DOB: 07-Jul-1934 Today's Date: 02/03/2016 Time: 0850-0910 SLP Time Calculation (min) (ACUTE ONLY): 20 min  Assessment / Plan / Recommendation Clinical Impression  Provided total assist for et up with am meal as pt was lethargic initially. After set up with able to consume dys 3 solids and thin liquids independently with one cough with a larger sip. Pt is likely back to baseline mentation and needs. Will benefit from ongoing soft solids to assist with self feeding. No f/u for dysphagia needed. Sign off.    HPI HPI: Patient with past medical history of chronic A. fib recently cardioverted in 01/08/2016, on adequate is noncompliant, dyslipidemia, dementia per family, sleep apnea, COPD, not on C Pap, not on home oxygen, was admitted on 01/27/2016, with complaint of confusion with fall. Patient has history of gait ataxia as well as dementia at her baseline and follows up with neurology as an outpatient. Patient was found to have acute encephalopathy of unknown etiology. On 01/29/2016 patient shows significant improvement in mentation.      SLP Plan  Discharge SLP treatment due to (comment)     Recommendations  Diet recommendations: Dysphagia 3 (mechanical soft);Thin liquid Liquids provided via: Cup;Straw Medication Administration: Whole meds with puree Supervision: Intermittent supervision to cue for compensatory strategies Compensations: Slow rate;Small sips/bites;Minimize environmental distractions Postural Changes and/or Swallow Maneuvers: Seated upright 90 degrees;Upright 30-60 min after meal             Plan: Discharge SLP treatment due to (comment)     GO                Halee Glynn, Katherene Ponto 02/03/2016, 9:23 AM
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
## 2016-02-03 NOTE — Progress Notes (Signed)
Physical Therapy Treatment Patient Details Name: Linda Morse MRN: [id number] DOB: [birth year] Today's Date: 02/03/2016    History of Present Illness Pt adm with acute encephalopathy, lumbar puncture pending, meningitis suspected. Marland Kitchen PMH - dementia, COPD, afib.     PT Comments    Found pt in bed incont urine.  Assisted pt OOB to [facility] to void. Pt was unable to perform self sit to stand with walker, so used "[employer]" stand pivot 1/4 turn transfer tech.  Pt appers very weary/weak.  Unable to functionally stand and support herself, amb was not attempted.  Assisted to recliner same "[employer]" tech and positioned to comfort.    Follow Up Recommendations  SNF     Equipment Recommendations       Recommendations for Other Services       Precautions / Restrictions Precautions Precautions: Fall Precaution Comments: B ankle PF Restrictions Weight Bearing Restrictions: No    Mobility  Bed Mobility Overal bed mobility: Needs Assistance Bed Mobility: Supine to Sit     Supine to sit: Max assist     General bed mobility comments: required increased assist and use of pad to scoot to EOB.  Once upright, pt required Min Assist to prevent forward LOB.  Appears very weak.   Transfers Overall transfer level: Needs assistance Equipment used: None Transfers: Stand Pivot Transfers Sit to Stand: Total assist         General transfer comment: pt was unable to fully stand using walker.  Performed stand pivot 1/4 turn from elevated bed to [facility] "[employer] tech.  Pt was unable to support own body weight.  Total Assist pt 5%  Ambulation/Gait             General Gait Details: unable to attempt due to low transfer ability   Stairs            Wheelchair Mobility    Modified Rankin (Stroke Patients Only)       Balance                                    Cognition Arousal/Alertness: Awake/alert;Lethargic (50/50) Behavior During Therapy: Flat affect Overall  Cognitive Status: No family/caregiver present to determine baseline cognitive functioning                      Exercises      General Comments        Pertinent Vitals/Pain Pain Assessment: No/denies pain    Home Living                      Prior Function            PT Goals (current goals can now be found in the care plan section) Progress towards PT goals: Progressing toward goals    Frequency  Min 3X/week    PT Plan Current plan remains appropriate    Co-evaluation             End of Session Equipment Utilized During Treatment: Gait belt Activity Tolerance: Treatment limited secondary to medical complications (Comment) (very weak) Patient left: in chair;with chair alarm set     Time: 1135-1159 PT Time Calculation (min) (ACUTE ONLY): 24 min  Charges:  $Therapeutic Activity: 23-37 mins                    G Codes:  Rica Koyanagi  PTA [facility]  Acute  Rehab Pager      [phone]

## 2016-02-04 ENCOUNTER — Encounter (HOSPITAL_COMMUNITY): Payer: Self-pay | Admitting: Physician Assistant

## 2016-02-04 ENCOUNTER — Ambulatory Visit: Payer: Medicare Other | Admitting: Physician Assistant

## 2016-02-04 DIAGNOSIS — I10 Essential (primary) hypertension: Secondary | ICD-10-CM

## 2016-02-04 DIAGNOSIS — F0391 Unspecified dementia with behavioral disturbance: Secondary | ICD-10-CM

## 2016-02-04 MED ORDER — APIXABAN 5 MG PO TABS: 2.5000 mg | ORAL_TABLET | Freq: Two times a day (BID) | ORAL | Status: AC

## 2016-02-04 MED ORDER — METOPROLOL SUCCINATE ER 25 MG PO TB24: 25.0000 mg | ORAL_TABLET | Freq: Every day | ORAL | Status: DC

## 2016-02-04 MED ORDER — AMLODIPINE BESYLATE 2.5 MG PO TABS: 5.0000 mg | ORAL_TABLET | Freq: Every day | ORAL | Status: DC
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
## 2016-02-04 NOTE — Care Management Note (Signed)
Case Management Note  Patient Details  Name: Linda Morse MRN: [id number] Date of Birth: [birth year]  Subjective/Objective:                 Spoke with patient's daughter Linda Morse in the room. Preferred [facility] for [facility] services including SW, referral made. Declined any DME needs. Patient will be staying with daughter Linda Morse at discharge who lives next door to her per [place]. Private duty list given per request of Linda Morse. Family had declined SNF.   Action/Plan:  DC to home with [facility] through [facility].    Expected Discharge Date:  01/29/16               Expected Discharge Plan:  [place]  In-House Referral:  Clinical Social Work  Discharge planning Services  CM Consult  Post Acute Care Choice:  Home Health Choice offered to:  Patient, Adult Children (daughter Linda Morse)  DME Arranged:    DME Agency:     HH Arranged:  RN, PT, OT, Nurse's Aide, Social Work [employer] Agency:  [facility]  Status of Service:  Completed, signed off  Medicare Important Message Given:  Yes Date Medicare IM Given:    Medicare IM give by:    Date Additional Medicare IM Given:    Additional Medicare Important Message give by:     If discussed at [place] of Stay Meetings, dates discussed:    Additional Comments:  Linda Collet, RN 02/04/2016, 12:22 PM

## 2016-02-04 NOTE — Care Management Important Message (Signed)
Important Message  Patient Details  Name: Linda Morse MRN: [id number] Date of Birth: 12-19-1933   Medicare Important Message Given:  Yes    Carles Collet, RN 02/04/2016, 9:01 AM

## 2016-02-04 NOTE — Progress Notes (Signed)
Pt discharged to home with family. IV removed.  Pt. Is alert and oriented. Pt is hemodynamically stable. AVS reviewed with pt. Capable of re verbalizing medication regimen. Discharge plan appropriate and in place.

## 2016-02-04 NOTE — Progress Notes (Addendum)
Nsg Discharge Note  Admit Date:  01/27/2016 Discharge date: 02/04/2016   Linda Morse to be D/C'd Home with home health per MD order.  AVS completed.  Copy for chart, and copy for patient signed, and dated. Patient/caregiver able to verbalize understanding.  Discharge Medication:   Medication List    TAKE these medications        alendronate 70 MG tablet  Commonly known as:  FOSAMAX  Take 70 mg by mouth once a week.     ALIVE WOMENS GUMMY PO  Take 2 capsules by mouth daily.     apixaban 5 MG Tabs tablet  Commonly known as:  ELIQUIS  Take 0.5 tablets (2.5 mg total) by mouth 2 (two) times daily.     D3-1000 1000 units capsule  Generic drug:  Cholecalciferol  Take 1,000 Units by mouth daily.     FLUoxetine 20 MG capsule  Commonly known as:  PROZAC  Take 20 mg by mouth daily.     memantine 10 MG tablet  Commonly known as:  NAMENDA  Take 1 tablet (10 mg total) by mouth 2 (two) times daily.     pyridOXINE 100 MG tablet  Commonly known as:  VITAMIN B-6  Take 100 mg by mouth daily.     solifenacin 5 MG tablet  Commonly known as:  VESICARE  Take 1 tablet (5 mg total) by mouth daily.     Vitamin B-12 1000 MCG Subl  Place 1,000 mcg under the tongue daily.     vitamin C 1000 MG tablet  Take 1,000 mg by mouth daily.        Discharge Assessment: Filed Vitals:   02/04/16 0032 02/04/16 0431  BP: 154/67 159/70  Pulse: 79 76  Temp: 98.5 F (36.9 C) 98.3 F (36.8 C)  Resp: 15 15   Skin clean, dry and intact without evidence of skin break down, no evidence of skin tears noted. IV catheter discontinued intact. Site without signs and symptoms of complications - no redness or edema noted at insertion site, patient denies c/o pain - only slight tenderness at site.  Dressing with slight pressure applied.  D/c Instructions-Education: Discharge instructions given to patient/family with verbalized understanding. D/c education completed with patient/family including follow  up instructions, medication list, d/c activities limitations if indicated, with other d/c instructions as indicated by MD - patient able to verbalize understanding, all questions fully answered. MD clarified all questions as well.  Patient instructed to return to ED, call 911, or call MD for any changes in condition.  Patient escorted via [place], and D/C home via private auto.   Dayle [place], RN 02/04/2016 1:41 PM

## 2016-02-04 NOTE — Progress Notes (Signed)
This is in error--pt did not vomit

## 2016-02-16 ENCOUNTER — Telehealth: Payer: Self-pay | Admitting: Physician Assistant

## 2016-02-16 DIAGNOSIS — R55 Syncope and collapse: Secondary | ICD-10-CM

## 2016-02-16 NOTE — Telephone Encounter (Signed)
I have discussed her case with Dr. Fransico Him.  Dr. Radford Pax would like her to have an event monitor x 30 days to evaluate syncope (she was seen for this by Dr. Radford Pax previously). I know that she just got out of the hospital. She can arrange the event monitor when she is able to get out of the house more in the next 2-3 weeks. Please put order under Dr. Theodosia Blender name. Richardson Dopp, PA-C   02/16/2016 4:58 PM

## 2016-02-17 NOTE — Telephone Encounter (Signed)
Left message to call back  

## 2016-02-18 ENCOUNTER — Telehealth: Payer: Self-pay | Admitting: Cardiology

## 2016-02-18 NOTE — Telephone Encounter (Signed)
Spoke with daughter, Jeanne Ivan, [place] on file. Informed her of order for monitor. Advised Richardson Dopp, PA-C said ok to wait 2-3 weeks since she was recently hospitalized. Daughter verbalized understanding and was in agreement with this plan.

## 2016-02-18 NOTE — Telephone Encounter (Signed)
Left message to call back  

## 2016-02-18 NOTE — Telephone Encounter (Signed)
See phone note from 6/12

## 2016-02-18 NOTE — Telephone Encounter (Signed)
Follow-up     The daughter is retuning the nurses call

## 2016-02-18 NOTE — Addendum Note (Signed)
Addended by: Loren Racer on: 02/18/2016 04:17 PM   Modules accepted: Orders

## 2016-04-21 ENCOUNTER — Encounter (HOSPITAL_COMMUNITY): Payer: Self-pay | Admitting: Physician Assistant

## 2016-04-30 ENCOUNTER — Ambulatory Visit: Payer: Medicare Other | Admitting: Podiatry

## 2016-05-02 ENCOUNTER — Emergency Department (HOSPITAL_COMMUNITY): Payer: Medicare Other

## 2016-05-02 ENCOUNTER — Emergency Department (HOSPITAL_COMMUNITY)
Admission: EM | Admit: 2016-05-02 | Discharge: 2016-05-03 | Disposition: A | Discharge status: Home / Self Care | Payer: Medicare Other | Attending: Emergency Medicine | Admitting: Emergency Medicine

## 2016-05-02 DIAGNOSIS — Z7901 Long term (current) use of anticoagulants: Secondary | ICD-10-CM | POA: Diagnosis not present

## 2016-05-02 DIAGNOSIS — S7001XA Contusion of right hip, initial encounter: Secondary | ICD-10-CM | POA: Insufficient documentation

## 2016-05-02 DIAGNOSIS — Y999 Unspecified external cause status: Secondary | ICD-10-CM | POA: Insufficient documentation

## 2016-05-02 DIAGNOSIS — Y929 Unspecified place or not applicable: Secondary | ICD-10-CM | POA: Diagnosis not present

## 2016-05-02 DIAGNOSIS — J449 Chronic obstructive pulmonary disease, unspecified: Secondary | ICD-10-CM | POA: Insufficient documentation

## 2016-05-02 DIAGNOSIS — W1830XA Fall on same level, unspecified, initial encounter: Secondary | ICD-10-CM | POA: Insufficient documentation

## 2016-05-02 DIAGNOSIS — Z7982 Long term (current) use of aspirin: Secondary | ICD-10-CM | POA: Diagnosis not present

## 2016-05-02 DIAGNOSIS — W19XXXA Unspecified fall, initial encounter: Secondary | ICD-10-CM

## 2016-05-02 DIAGNOSIS — Y939 Activity, unspecified: Secondary | ICD-10-CM | POA: Diagnosis not present

## 2016-05-02 DIAGNOSIS — F17200 Nicotine dependence, unspecified, uncomplicated: Secondary | ICD-10-CM | POA: Insufficient documentation

## 2016-05-02 DIAGNOSIS — S79911A Unspecified injury of right hip, initial encounter: Secondary | ICD-10-CM | POA: Diagnosis present

## 2016-05-02 DIAGNOSIS — F0390 Unspecified dementia without behavioral disturbance: Secondary | ICD-10-CM | POA: Insufficient documentation

## 2016-05-02 DIAGNOSIS — M25551 Pain in right hip: Secondary | ICD-10-CM

## 2016-05-02 LAB — URINALYSIS, ROUTINE W REFLEX MICROSCOPIC
Bilirubin Urine: NEGATIVE
GLUCOSE, UA: NEGATIVE mg/dL
HGB URINE DIPSTICK: NEGATIVE
Ketones, ur: NEGATIVE mg/dL
Nitrite: NEGATIVE
PH: 7.5 (ref 5.0–8.0)
Protein, ur: NEGATIVE mg/dL
SPECIFIC GRAVITY, URINE: 1.016 (ref 1.005–1.030)

## 2016-05-02 LAB — I-STAT CHEM 8, ED
BUN: 16 mg/dL (ref 6–20)
CALCIUM ION: 1.05 mmol/L — AB (ref 1.12–1.23)
CHLORIDE: 99 mmol/L — AB (ref 101–111)
CREATININE: 0.6 mg/dL (ref 0.44–1.00)
GLUCOSE: 102 mg/dL — AB (ref 65–99)
HCT: 33 % — ABNORMAL LOW (ref 36.0–46.0)
Hemoglobin: 11.2 g/dL — ABNORMAL LOW (ref 12.0–15.0)
POTASSIUM: 4 mmol/L (ref 3.5–5.1)
Sodium: 136 mmol/L (ref 135–145)
TCO2: 27 mmol/L (ref 0–100)

## 2016-05-02 LAB — URINE MICROSCOPIC-ADD ON

## 2016-05-02 NOTE — ED Notes (Signed)
Pt just returned from c-t 10 m inutes ago

## 2016-05-02 NOTE — ED Notes (Signed)
The pt has had multiple falls at the nursing home  She  Has bruising to her rt toes  ?? Old and a large bruise to her rt hip  Also looks old  The pty rep[orts that she fell a wseek ago.  Alert does not know thge day of the week month or the year.  Other questions she answered okl  Moves all extremities   jx stroke

## 2016-05-02 NOTE — Discharge Instructions (Signed)
Follow up with primary doctor.  If you were given medicines take as directed.  If you are on coumadin or contraceptives realize their levels and effectiveness is altered by many different medicines.  If you have any reaction (rash, tongues swelling, other) to the medicines stop taking and see a physician.    If your blood pressure was elevated in the ER make sure you follow up for management with a primary doctor or return for chest pain, shortness of breath or stroke symptoms.  Please follow up as directed and return to the ER or see a physician for new or worsening symptoms.  Thank you. Vitals:   05/02/16 1624 05/02/16 1648  BP:  156/63  Pulse:  (!) 58  Resp:  12  Temp:  98.7 F (37.1 C)  TempSrc:  Oral  SpO2: 97% 98%

## 2016-05-02 NOTE — ED Notes (Signed)
Report called to [facility]  And ptar was called to transport back there

## 2016-05-02 NOTE — ED Notes (Signed)
In and ouot cath performed clear urine  approx 118ml

## 2016-05-02 NOTE — ED Notes (Signed)
pts daughter at the bedside  Pt wanting to get out of bed

## 2016-05-02 NOTE — ED Notes (Signed)
Pt still in ct.  

## 2016-05-02 NOTE — ED Notes (Signed)
edp at the bedside 

## 2016-05-02 NOTE — ED Notes (Signed)
The pts daughter has been waiting patiently for the orders to return to the nursing home  Her mother has not eaten  This afternoion  She can not eat a [place] sandwich   She has to have thicken liquids

## 2016-05-02 NOTE — ED Notes (Signed)
The pts daughter is asking how long before she is ready to go back to nursing home

## 2016-05-02 NOTE — ED Provider Notes (Signed)
[facility] DEPT Provider Note   CSN: [id number] Arrival date & time: 05/02/16  1630     History   Chief Complaint Chief Complaint  Patient presents with  . Fall    HPI Linda Morse is a 82 y.o. female.  Patient presents from [facility] facility for increased frequency of falls and bruising on right hip and toes. Unwitnessed falls. Patient has stroke recently and has been in rehabilitation. Patient is on Eliquis. No fevers chills or infectious symptoms per daughter or report. Patient does have mild slurred speech and issue since her stroke however this is overall at baseline. Pain with range of motion.      Past Medical History:  Diagnosis Date  . A-fib ([place])   . Arthritis   . Atrial fibrillation, persistent ([place]) 12/08/2015   a. s/p TEE/DCCV 01/08/16.  Marland Kitchen Complication of anesthesia    takes 3- 4 days to wake up  . COPD (chronic obstructive pulmonary disease) ([place])   . Dementia   . Depression   . Fatigue 12/08/2015  . Hypercholesterolemia   . Sleep apnea   . Syncope 12/08/2015    Patient Active Problem List   Diagnosis Date Noted  . Essential hypertension, benign 02/04/2016  . Meningitis   . Cerebral ventriculomegaly due to brain atrophy 01/28/2016  . Leucocytosis 01/28/2016  . suspected Encephalitis 01/28/2016  . B12 deficiency 01/28/2016  . Encephalopathy 01/27/2016  . Dementia 01/27/2016  . Delirium 01/27/2016  . Acute encephalopathy 01/27/2016  . Persistent atrial fibrillation ([place])   . Atrial fibrillation, persistent ([place]) 12/08/2015  . Syncope 12/08/2015  . SOB (shortness of breath) 12/08/2015  . Fatigue 12/08/2015  . Memory loss 09/10/2015  . Abnormality of gait 09/10/2015  . TOBACCO ABUSE 09/06/2007  . OBSTRUCTIVE SLEEP APNEA 09/06/2007  . COPD (chronic obstructive pulmonary disease) ([place]) 09/06/2007    Past Surgical History:  Procedure Laterality Date  . ABDOMINAL HYSTERECTOMY    . BACK SURGERY    . CARDIOVERSION [name]  01/08/2016   Procedure: CARDIOVERSION;  Surgeon: Jerline Pain, MD;  Location: [facility];  Service: Cardiovascular;  Laterality: [name];  . EYE SURGERY Bilateral    catarats  . KNEE SURGERY    . NECK SURGERY    . ORIF WRIST FRACTURE Right 06/16/2013   Procedure: OPEN REDUCTION INTERNAL FIXATION (ORIF) RIGHT WRIST FRACTURE ;  Surgeon: Roseanne Kaufman, MD;  Location: [facility];  Service: Orthopedics;  Laterality: Right;  . TEE WITHOUT CARDIOVERSION [name] 01/08/2016   Procedure: TRANSESOPHAGEAL ECHOCARDIOGRAM (TEE);  Surgeon: Jerline Pain, MD;  Location[facility] ENDOSCOPY;  Service: Cardiovascular;  Laterality: [name];    OB History    No data available       Home Medications    Prior to Admission medications   Medication Sig Start Date End Date Taking? Authorizing Provider  apixaban (ELIQUIS) 5 MG TABS tablet Take 0.5 tablets (2.5 mg total) by mouth 2 (two) times daily. 02/04/16  Yes Nishant Dhungel, MD  aspirin EC 81 MG tablet Take 81 mg by mouth daily.   Yes Historical Provider, MD  atorvastatin (LIPITOR) 40 MG tablet Take 40 mg by mouth daily.  04/07/16  Yes Historical Provider, MD  FLUoxetine (PROZAC) 20 MG capsule Take 20 mg by mouth daily.  08/07/15  Yes Historical Provider, MD  memantine (NAMENDA) 10 MG tablet Take 1 tablet (10 mg total) by mouth 2 (two) times daily. 05/19/15  Yes Marcial Pacas, MD  nicotine (NICODERM CQ - DOSED IN MG/24 HOURS) 14 mg/24hr  patch Place 14 mg onto the skin daily.   Yes Historical Provider, MD  Omega-3 Fatty Acids (FISH OIL) 1000 MG CAPS Take 1,000 mg by mouth daily.   Yes Historical Provider, MD  QUEtiapine (SEROQUEL) 25 MG tablet Take 25-75 mg by mouth See admin instructions. Take 1 tablet (25 mg) by mouth every morning and 3 tablets (75 mg) at bedtime 04/12/16  Yes Historical Provider, MD  solifenacin (VESICARE) 5 MG tablet Take 1 tablet (5 mg total) by mouth daily. 05/19/15  Yes Marcial Pacas, MD    Family History Family History  Problem Relation Age of Onset  . Hypertension  Mother   . Lung cancer Father   . Congestive Heart Failure Mother     Social History Social History  Substance Use Topics  . Smoking status: Current Every Day Smoker    Packs/day: 1.00    Years: 57.00  . Smokeless tobacco: Not on file  . Alcohol use No     Allergies   Review of patient's allergies indicates no known allergies.   Review of Systems Review of Systems  Unable to perform ROS: Other  Constitutional: Negative for chills and fever.  HENT: Negative for congestion.   Eyes: Negative for visual disturbance.  Respiratory: Negative for shortness of breath.   Cardiovascular: Negative for chest pain.  Gastrointestinal: Negative for abdominal pain and vomiting.  Genitourinary: Negative for dysuria and flank pain.  Musculoskeletal: Positive for arthralgias. Negative for back pain, neck pain and neck stiffness.  Skin: Negative for rash.  Neurological: Positive for weakness. Negative for light-headedness and headaches.     Physical Exam Updated Vital Signs BP 156/63 (BP Location: Right Arm)   Pulse (!) 58   Temp 98.7 F (37.1 C) (Oral)   Resp 12   SpO2 98%   Physical Exam  Constitutional: She appears well-developed and well-nourished. No distress.  HENT:  Head: Normocephalic and atraumatic.  Eyes: Conjunctivae are normal.  Neck: Neck supple.  Cardiovascular: Normal rate and regular rhythm.   No murmur heard. Pulmonary/Chest: Effort normal and breath sounds normal. No respiratory distress.  Abdominal: Soft. There is no tenderness.  Musculoskeletal: She exhibits tenderness. She exhibits no edema.  Neurological: She is alert.  Gen. weakness. Patient alert to name, daughter, and location. Mild dysarthria and aphasia. Patient follows commands.  Skin: Skin is warm and dry.  Patient has bruising to right lateral hip and right distal dorsal foot tenderness to palpation, right hip tenderness with external motion. No focal knee or wrist tenderness bilateral. No midline  back or neck tenderness.  Psychiatric: She is not agitated and not withdrawn.  Mild slowed response  Nursing note and vitals reviewed.    ED Treatments / Results  Labs (all labs ordered are listed, but only abnormal results are displayed) Labs Reviewed  URINALYSIS, ROUTINE W REFLEX MICROSCOPIC (NOT AT [facility]) - Abnormal; Notable for the following:       Result Value   Leukocytes, UA MODERATE (*)    All other components within normal limits  URINE MICROSCOPIC-ADD ON - Abnormal; Notable for the following:    Squamous Epithelial / LPF 0-5 (*)    Bacteria, UA FEW (*)    All other components within normal limits  I-STAT CHEM 8, ED - Abnormal; Notable for the following:    Chloride 99 (*)    Glucose, Bld 102 (*)    Calcium, Ion 1.05 (*)    Hemoglobin 11.2 (*)    HCT 33.0 (*)  All other components within normal limits    EKG  EKG Interpretation  Date/Time:  Sunday May 02 2016 16:47:22 EDT Ventricular Rate:  56 PR Interval:    QRS Duration: 83 QT Interval:  427 QTC Calculation: 413 R Axis:   79 Text Interpretation:  Sinus rhythm poor baseline Confirmed by Kayan Blissett MD, Raeanne Deschler [phone]) on 05/02/2016 6:57:59 PM       Radiology Ct Head Wo Contrast  Result Date: 05/02/2016 CLINICAL DATA:  Under witnessed fall. Neck pain. Initial encounter. EXAM: CT HEAD WITHOUT CONTRAST CT CERVICAL SPINE WITHOUT CONTRAST TECHNIQUE: Multidetector CT imaging of the head and cervical spine was performed following the standard protocol without intravenous contrast. Multiplanar CT image reconstructions of the cervical spine were also generated. COMPARISON:  CT head and cervical spine 01/27/2016 FINDINGS: CT HEAD FINDINGS Brain: There is no evidence of acute intracranial hemorrhage, mass lesion, brain edema or extra-axial fluid collection. There is diffuse prominence of the ventricles and subarachnoid spaces consistent with atrophy. Chronic periventricular white matter disease appears mildly progressive,  especially within the corona radiata on the left. There is no CT evidence of acute cortical infarction. Vascular: Intracranial vascular calcifications are noted. Skull: Negative for fracture or focal lesion. Sinuses/Orbits: Mild mucosal thickening in the ethmoid sinuses. There are no air-fluid levels. The mastoid air cells on the right are under pneumatized. The orbits appear unremarkable. Other: None. CT CERVICAL SPINE FINDINGS Alignment: Stable degenerative anterolisthesis at C7-T1. No evidence of traumatic malalignment. Skull base and vertebrae: No evidence of acute fracture. There are postsurgical changes status post partial C5 corpectomy and C4-7 ACDF. There is solid interbody and facet joint ankylosis. There is stable ossification of the ligamentum nuchae. Soft tissues and canal: No prevertebral fluid. No gross canal hematoma. Degenerative: There are stable degenerative changes at C1-2. There is stable adjacent segment disease at C3-4 with uncinate spurring bilaterally. Prominent facet disease is noted at C7-T1, contributing to mild foraminal narrowing bilaterally. Other:  Emphysematous changes are present at the lung apices. IMPRESSION: 1. No acute intracranial or calvarial findings. 2. Chronic atrophy with mildly progressive chronic small vessel ischemic changes compared with prior studies. 3. No evidence of acute cervical spine fracture, traumatic subluxation or static signs of instability. Stable degenerative and postsurgical findings in the cervical spine. Electronically Signed   By: Richardean Sale M.D.   On: 05/02/2016 19:35   Ct Cervical Spine Wo Contrast  Result Date: 05/02/2016 CLINICAL DATA:  Under witnessed fall. Neck pain. Initial encounter. EXAM: CT HEAD WITHOUT CONTRAST CT CERVICAL SPINE WITHOUT CONTRAST TECHNIQUE: Multidetector CT imaging of the head and cervical spine was performed following the standard protocol without intravenous contrast. Multiplanar CT image reconstructions of the  cervical spine were also generated. COMPARISON:  CT head and cervical spine 01/27/2016 FINDINGS: CT HEAD FINDINGS Brain: There is no evidence of acute intracranial hemorrhage, mass lesion, brain edema or extra-axial fluid collection. There is diffuse prominence of the ventricles and subarachnoid spaces consistent with atrophy. Chronic periventricular white matter disease appears mildly progressive, especially within the corona radiata on the left. There is no CT evidence of acute cortical infarction. Vascular: Intracranial vascular calcifications are noted. Skull: Negative for fracture or focal lesion. Sinuses/Orbits: Mild mucosal thickening in the ethmoid sinuses. There are no air-fluid levels. The mastoid air cells on the right are under pneumatized. The orbits appear unremarkable. Other: None. CT CERVICAL SPINE FINDINGS Alignment: Stable degenerative anterolisthesis at C7-T1. No evidence of traumatic malalignment. Skull base and vertebrae: No evidence of acute fracture.  There are postsurgical changes status post partial C5 corpectomy and C4-7 ACDF. There is solid interbody and facet joint ankylosis. There is stable ossification of the ligamentum nuchae. Soft tissues and canal: No prevertebral fluid. No gross canal hematoma. Degenerative: There are stable degenerative changes at C1-2. There is stable adjacent segment disease at C3-4 with uncinate spurring bilaterally. Prominent facet disease is noted at C7-T1, contributing to mild foraminal narrowing bilaterally. Other:  Emphysematous changes are present at the lung apices. IMPRESSION: 1. No acute intracranial or calvarial findings. 2. Chronic atrophy with mildly progressive chronic small vessel ischemic changes compared with prior studies. 3. No evidence of acute cervical spine fracture, traumatic subluxation or static signs of instability. Stable degenerative and postsurgical findings in the cervical spine. Electronically Signed   By: Richardean Sale M.D.   On:  05/02/2016 19:35   Ct Hip Right Wo Contrast  Result Date: 05/02/2016 CLINICAL DATA:  Status post fall.  Right hip pain. EXAM: CT OF THE RIGHT HIP WITHOUT CONTRAST TECHNIQUE: Multidetector CT imaging of the right hip was performed according to the standard protocol. Multiplanar CT image reconstructions were also generated. COMPARISON:  None. FINDINGS: Bones/Joint/Cartilage No right hip fracture or dislocation. Severe superior right hip joint space narrowing with a bone-on-bone appearance and small marginal osteophytes. No aggressive lytic or sclerotic osseous lesion. Bilateral pedicle screws from posterior lumbar fusion are noted at L5. Bilateral severe facet arthropathy at L5-S1. Mild osteoarthritis of the right sacroiliac joint. Ligaments Suboptimally assessed by CT. Muscles and Tendons The muscles are normal.  No muscle atrophy. Soft tissues No fluid collection or hematoma.  No soft tissue mass. IMPRESSION: 1. No acute osseous injury of the right hip. 2. Severe osteoarthritis of the right hip. Electronically Signed   By: Kathreen Devoid   On: 05/02/2016 19:45   Dg Foot Complete Right  Result Date: 05/02/2016 CLINICAL DATA:  Pain and bruising of the right foot after a fall. EXAM: RIGHT FOOT COMPLETE - 3+ VIEW COMPARISON:  MRI right forefoot 01/19/2010 FINDINGS: Mild hallux valgus deformity with degenerative changes in the first metatarsal-phalangeal joint as well as in the interphalangeal joints. No evidence of acute fracture or dislocation. No focal bone lesion or bone destruction. Bone cortex appears intact. Soft tissues are unremarkable. IMPRESSION: No acute bony abnormalities. Hallux valgus deformity and degenerative changes. Electronically Signed   By: Lucienne Capers M.D.   On: 05/02/2016 18:26    Procedures Procedures (including critical care time)  Medications Ordered in ED Medications - No data to display   Initial Impression / Assessment and Plan / ED Course  I have reviewed the triage  vital signs and the nursing notes.  Pertinent labs & imaging results that were available during my care of the patient were reviewed by me and considered in my medical decision making (see chart for details).  Clinical Course   Patient presents with frequent falls. Patient does have evidence of injury right hip and foot, x-rays and CT scan of the head ordered. Screening blood work and urinalysis as well.    Final Clinical Impressions(s) / ED Diagnoses   Final diagnoses:  Fall, initial encounter  Right hip pain    New Prescriptions New Prescriptions   No medications on file     Elnora Morrison, MD 05/02/16 2055

## 2016-05-02 NOTE — ED Triage Notes (Signed)
Pt from [facility] - pt has had several falls over the last 24 hours. Is on eloquis. Unwitnessed fall and found her on the floor. No obvious bruising or deformity. Pt is not complaining of any pain. Pt was able to take a few steps to the stretcher. Pt does have a little bit of slurred speech from previous falls.

## 2016-05-03 ENCOUNTER — Other Ambulatory Visit: Payer: Self-pay | Admitting: Neurology

## 2016-05-24 ENCOUNTER — Other Ambulatory Visit: Payer: Self-pay | Admitting: Neurology

## 2016-07-07 DEATH — deceased

## 2018-03-06 IMAGING — CT CT HEAD W/O CM
3 series · 15 of 47 positions shown, 18 images · non-contrast
Comparison: CT head and cervical spine 01/27/2016

CLINICAL DATA: Under witnessed fall. Neck pain. Initial encounter.

EXAM:
CT HEAD WITHOUT CONTRAST
CT CERVICAL SPINE WITHOUT CONTRAST
TECHNIQUE: Multidetector CT imaging of the head and cervical spine was
performed following the standard protocol without intravenous
contrast. Multiplanar CT image reconstructions of the cervical spine
were also generated.

[Series 2: head 5.0 h30s · axial · 0.44mm/px · z∈[+1303,+1438]mm · 9 of 33 slices shown, 12 images]
[im 3/33  brain]
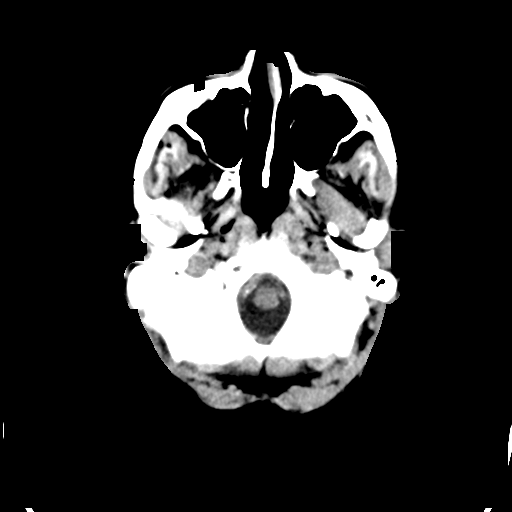
[im 3/33  bone]
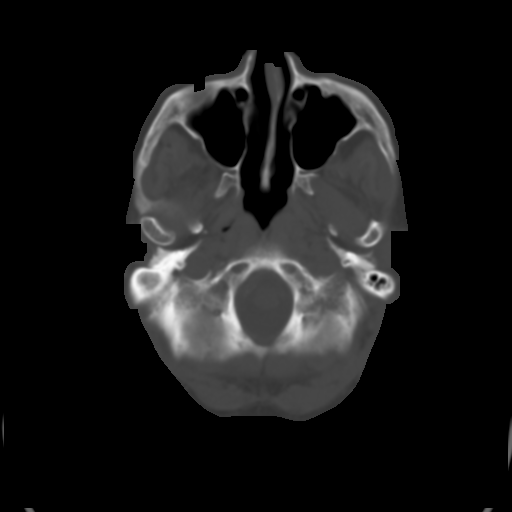
[im 6/33  brain]
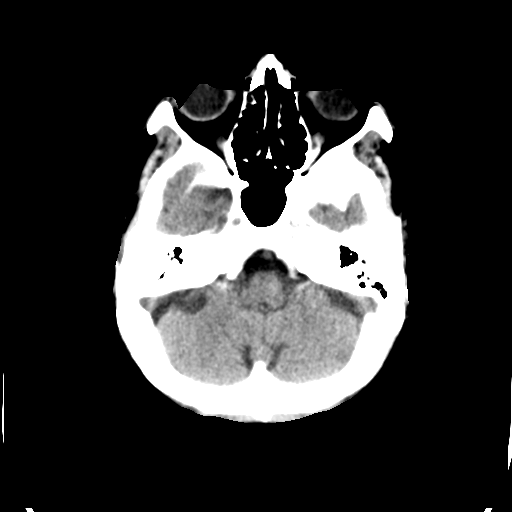
[im 9/33  brain]
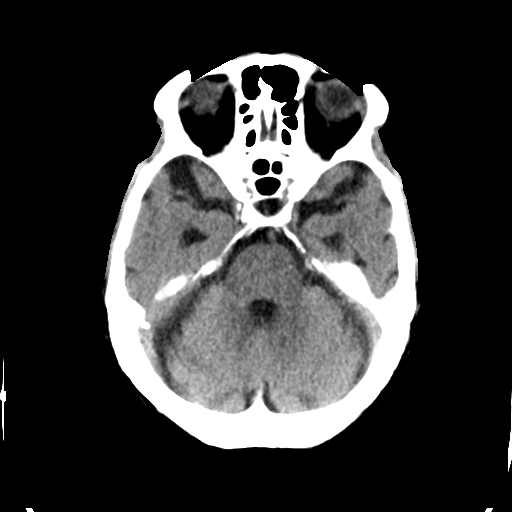
[im 13/33  brain]
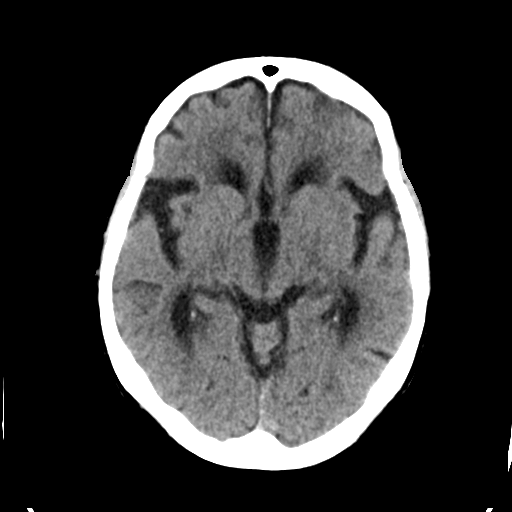
[im 17/33  brain]
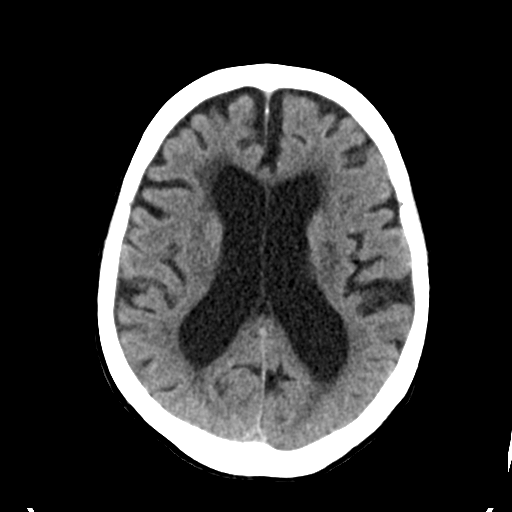
[im 17/33  bone]
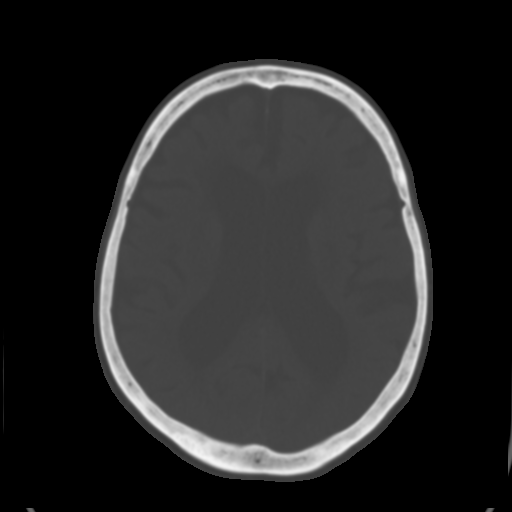
[im 20/33  brain]
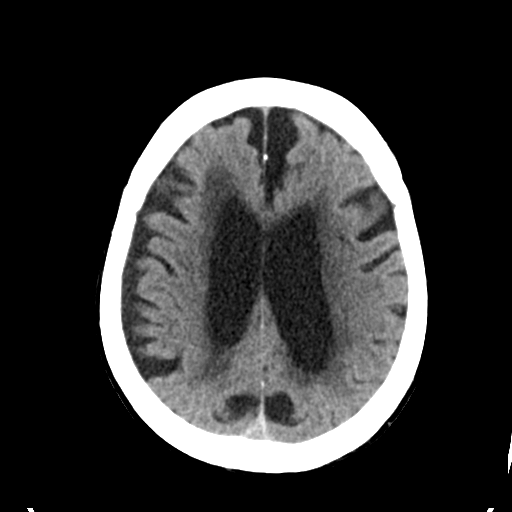
[im 24/33  brain]
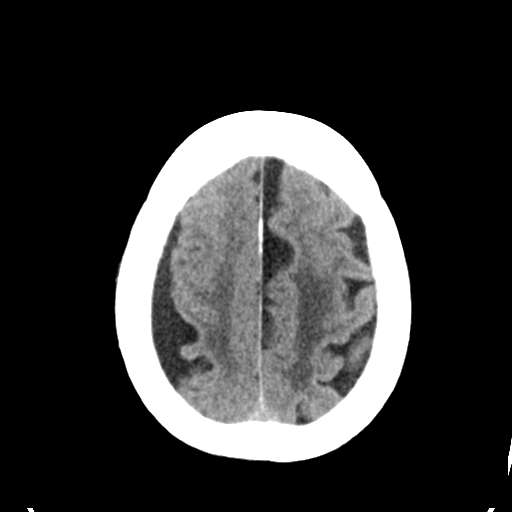
[im 27/33  brain]
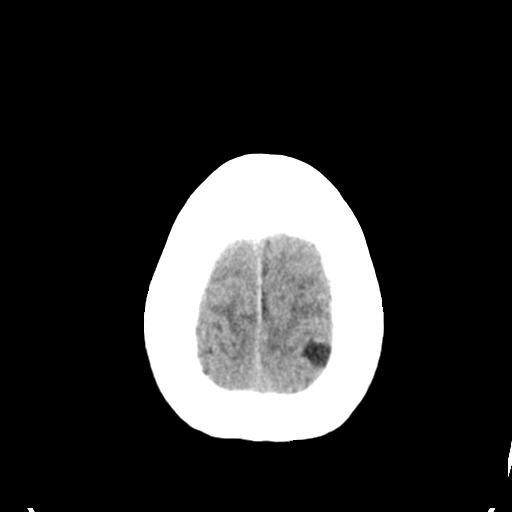
[im 30/33  brain]
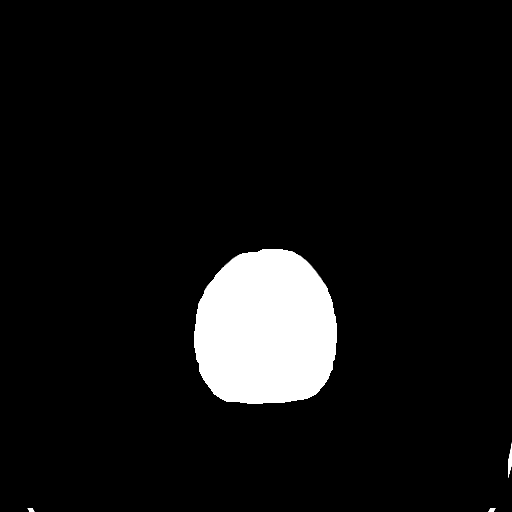
[im 30/33  bone]
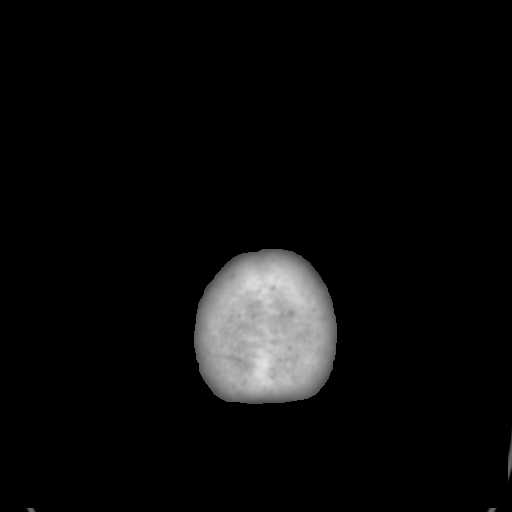

[Series 6: head 3.0 mpr · coronal · 0.32mm/px · 3 of 65 slices shown (1 of 2)]
[im 22/65  brain]
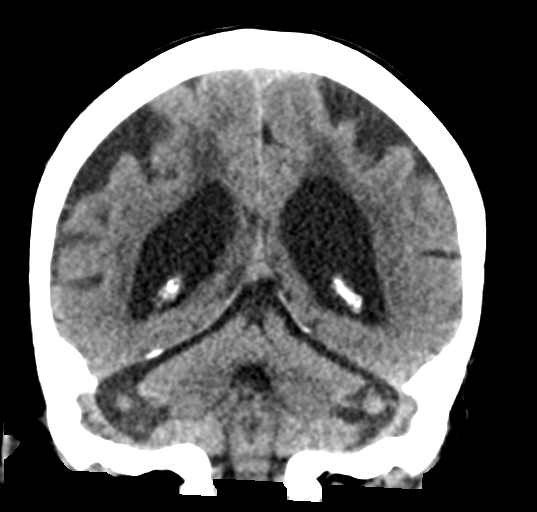
[im 29/65  brain]
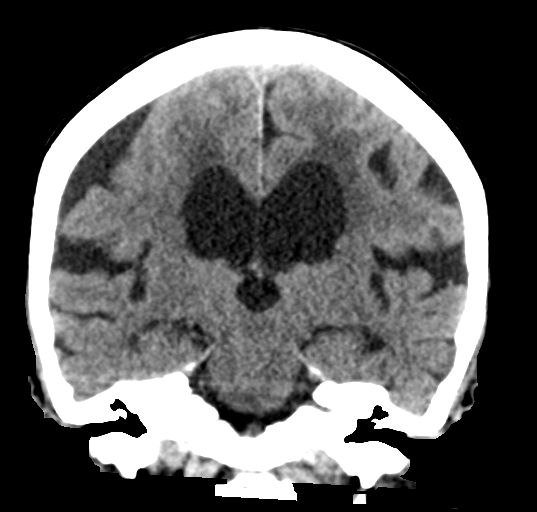
[im 36/65  brain]
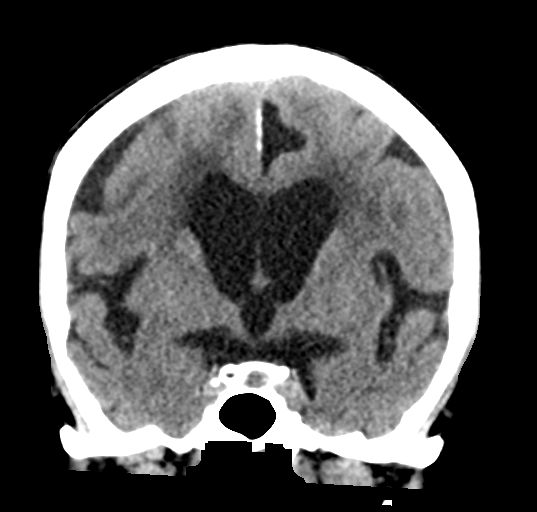

[Series 7: head 3.0 mpr · sagittal · 0.32mm/px · 3 of 52 slices shown (2 of 2)]
[im 18/52  brain]
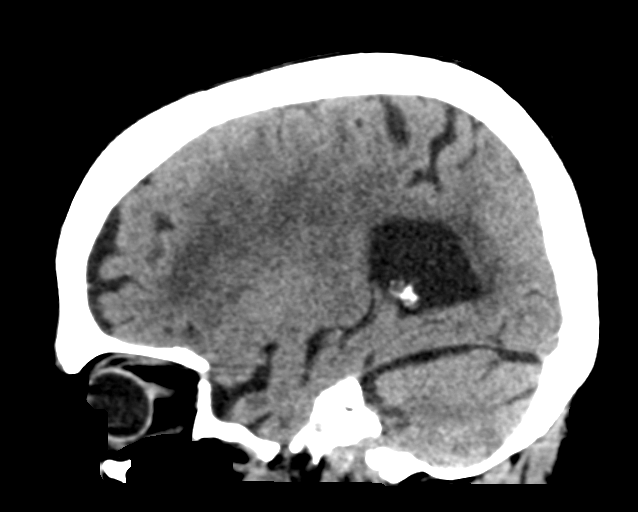
[im 26/52  brain]
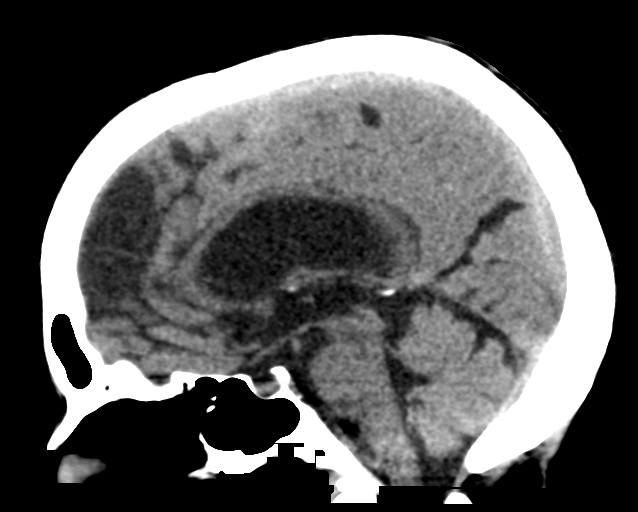
[im 35/52  brain]
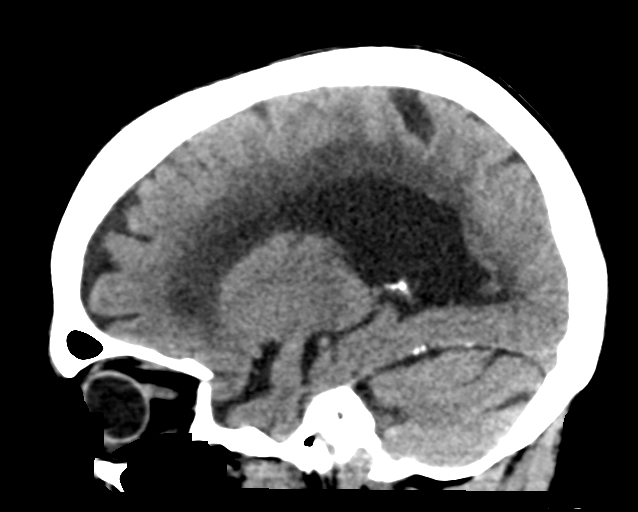

[15 of 47 positions shown; findings below may reference images not displayed]

FINDINGS: CT HEAD FINDINGS

Brain: There is no evidence of acute intracranial hemorrhage, mass
lesion, brain edema or extra-axial fluid collection. There is
diffuse prominence of the ventricles and subarachnoid spaces
consistent with atrophy. Chronic periventricular white matter
disease appears mildly progressive, especially within the corona
radiata on the left. There is no CT evidence of acute cortical
infarction.

Vascular: Intracranial vascular calcifications are noted.

Skull: Negative for fracture or focal lesion.

Sinuses/Orbits: Mild mucosal thickening in the ethmoid sinuses.
There are no air-fluid levels. The mastoid air cells on the right
are under pneumatized. The orbits appear unremarkable.

Other: None.

CT CERVICAL SPINE FINDINGS

Alignment: Stable degenerative anterolisthesis at C7-T1. No evidence
of traumatic malalignment.

Skull base and vertebrae: No evidence of acute fracture. There are
postsurgical changes status post partial C5 corpectomy and C4-7
ACDF. There is solid interbody and facet joint ankylosis. There is
stable ossification of the ligamentum nuchae.

Soft tissues and canal: No prevertebral fluid. No gross canal
hematoma.

Degenerative: There are stable degenerative changes at C1-2. There
is stable adjacent segment disease at C3-4 with uncinate spurring
bilaterally. Prominent facet disease is noted at C7-T1, contributing
to mild foraminal narrowing bilaterally.

Other:  Emphysematous changes are present at the lung apices.
IMPRESSION: 1. No acute intracranial or calvarial findings.
2. Chronic atrophy with mildly progressive chronic small vessel
ischemic changes compared with prior studies.
3. No evidence of acute cervical spine fracture, traumatic
subluxation or static signs of instability. Stable degenerative and
postsurgical findings in the cervical spine.
